# Patient Record
Sex: Female | Born: 1960 | Race: Black or African American | Hispanic: No | Marital: Married | Smoking: Current every day smoker
Health system: Southern US, Community
[De-identification: ages and names within clinical notes are randomized; demographics above are authoritative.]

## PROBLEM LIST (undated history)

## (undated) DIAGNOSIS — G8929 Other chronic pain: Secondary | ICD-10-CM

## (undated) DIAGNOSIS — E785 Hyperlipidemia, unspecified: Secondary | ICD-10-CM

## (undated) DIAGNOSIS — K219 Gastro-esophageal reflux disease without esophagitis: Secondary | ICD-10-CM

## (undated) DIAGNOSIS — R011 Cardiac murmur, unspecified: Secondary | ICD-10-CM

## (undated) DIAGNOSIS — Z8489 Family history of other specified conditions: Secondary | ICD-10-CM

## (undated) DIAGNOSIS — I82409 Acute embolism and thrombosis of unspecified deep veins of unspecified lower extremity: Secondary | ICD-10-CM

## (undated) DIAGNOSIS — M545 Low back pain, unspecified: Secondary | ICD-10-CM

## (undated) DIAGNOSIS — G43909 Migraine, unspecified, not intractable, without status migrainosus: Secondary | ICD-10-CM

## (undated) HISTORY — DX: Gastro-esophageal reflux disease without esophagitis: K21.9

## (undated) HISTORY — DX: Low back pain, unspecified: M54.50

## (undated) HISTORY — DX: Other chronic pain: G89.29

## (undated) HISTORY — DX: Hyperlipidemia, unspecified: E78.5

## (undated) HISTORY — PX: TONSILLECTOMY: SUR1361

## (undated) HISTORY — DX: Migraine, unspecified, not intractable, without status migrainosus: G43.909

## (undated) HISTORY — DX: Low back pain: M54.5

---

## 2003-01-11 ENCOUNTER — Emergency Department (HOSPITAL_COMMUNITY): Admission: EM | Admit: 2003-01-11 | Discharge: 2003-01-11 | Payer: Self-pay | Admitting: Emergency Medicine

## 2004-06-23 ENCOUNTER — Other Ambulatory Visit: Admission: RE | Admit: 2004-06-23 | Discharge: 2004-06-23 | Payer: Self-pay | Admitting: Family Medicine

## 2004-08-29 ENCOUNTER — Emergency Department (HOSPITAL_COMMUNITY): Admission: EM | Admit: 2004-08-29 | Discharge: 2004-08-29 | Payer: Self-pay | Admitting: Emergency Medicine

## 2005-07-09 ENCOUNTER — Other Ambulatory Visit: Admission: RE | Admit: 2005-07-09 | Discharge: 2005-07-09 | Payer: Self-pay | Admitting: Family Medicine

## 2007-07-28 ENCOUNTER — Ambulatory Visit: Payer: Self-pay | Admitting: Internal Medicine

## 2007-07-28 DIAGNOSIS — K219 Gastro-esophageal reflux disease without esophagitis: Secondary | ICD-10-CM

## 2007-07-28 DIAGNOSIS — E1169 Type 2 diabetes mellitus with other specified complication: Secondary | ICD-10-CM | POA: Insufficient documentation

## 2007-07-28 DIAGNOSIS — E785 Hyperlipidemia, unspecified: Secondary | ICD-10-CM

## 2007-07-28 DIAGNOSIS — G43909 Migraine, unspecified, not intractable, without status migrainosus: Secondary | ICD-10-CM | POA: Insufficient documentation

## 2007-07-28 DIAGNOSIS — R030 Elevated blood-pressure reading, without diagnosis of hypertension: Secondary | ICD-10-CM

## 2007-07-28 DIAGNOSIS — R079 Chest pain, unspecified: Secondary | ICD-10-CM

## 2007-07-28 DIAGNOSIS — F17200 Nicotine dependence, unspecified, uncomplicated: Secondary | ICD-10-CM | POA: Insufficient documentation

## 2007-07-29 ENCOUNTER — Encounter: Payer: Self-pay | Admitting: Internal Medicine

## 2007-07-29 ENCOUNTER — Telehealth: Payer: Self-pay | Admitting: Internal Medicine

## 2007-08-03 ENCOUNTER — Telehealth (INDEPENDENT_AMBULATORY_CARE_PROVIDER_SITE_OTHER): Payer: Self-pay | Admitting: *Deleted

## 2007-08-08 ENCOUNTER — Encounter: Payer: Self-pay | Admitting: Internal Medicine

## 2007-08-08 ENCOUNTER — Ambulatory Visit: Payer: Self-pay

## 2007-09-06 ENCOUNTER — Telehealth: Payer: Self-pay | Admitting: *Deleted

## 2007-09-16 ENCOUNTER — Ambulatory Visit: Payer: Self-pay | Admitting: Internal Medicine

## 2007-09-16 LAB — CONVERTED CEMR LAB
Nitrite: POSITIVE
Specific Gravity, Urine: >=1.03
Urobilinogen, UA: 0.2

## 2007-09-19 LAB — CONVERTED CEMR LAB
Alkaline Phosphatase: 50 units/L (ref 39–117)
Bilirubin, Direct: 0.1 mg/dL (ref 0.0–0.3)
Chloride: 101 meq/L (ref 96–112)
GFR calc Af Amer: 86 mL/min
GFR calc non Af Amer: 71 mL/min
Glucose, Bld: 87 mg/dL (ref 70–99)
HCT: 39.7 % (ref 36.0–46.0)
HDL: 44.9 mg/dL (ref >39.0)
LDL Cholesterol: 101 mg/dL — ABNORMAL HIGH (ref 0–99)
Monocytes Absolute: 0.5 10*3/uL (ref 0.1–1.0)
Monocytes Relative: 8.5 % (ref 3.0–12.0)
Neutrophils Relative %: 37.4 % — ABNORMAL LOW (ref 43.0–77.0)
Platelets: 301 10*3/uL (ref 150–400)
Potassium: 3.8 meq/L (ref 3.5–5.1)
RDW: 12.7 % (ref 11.5–14.6)
Sodium: 140 meq/L (ref 135–145)
Total CHOL/HDL Ratio: 3.6
Total Protein: 7.3 g/dL (ref 6.0–8.3)
Triglycerides: 84 mg/dL (ref 0–149)
VLDL: 17 mg/dL (ref 0–40)

## 2007-09-26 ENCOUNTER — Ambulatory Visit: Payer: Self-pay | Admitting: Internal Medicine

## 2007-09-26 DIAGNOSIS — E559 Vitamin D deficiency, unspecified: Secondary | ICD-10-CM | POA: Insufficient documentation

## 2007-10-04 ENCOUNTER — Telehealth: Payer: Self-pay | Admitting: Internal Medicine

## 2007-10-17 ENCOUNTER — Telehealth: Payer: Self-pay | Admitting: Internal Medicine

## 2007-11-10 ENCOUNTER — Telehealth: Payer: Self-pay | Admitting: *Deleted

## 2007-12-26 ENCOUNTER — Ambulatory Visit: Payer: Self-pay | Admitting: Internal Medicine

## 2007-12-26 LAB — CONVERTED CEMR LAB: Vit D, 1,25-Dihydroxy: 46 (ref 30–89)

## 2008-01-02 ENCOUNTER — Ambulatory Visit: Payer: Self-pay | Admitting: Internal Medicine

## 2008-01-02 ENCOUNTER — Encounter: Payer: Self-pay | Admitting: Internal Medicine

## 2008-01-02 ENCOUNTER — Other Ambulatory Visit: Admission: RE | Admit: 2008-01-02 | Discharge: 2008-01-02 | Payer: Self-pay | Admitting: Internal Medicine

## 2008-02-01 ENCOUNTER — Ambulatory Visit: Payer: Self-pay | Admitting: Gastroenterology

## 2008-02-22 LAB — CONVERTED CEMR LAB: Pap Smear: NORMAL

## 2008-04-04 ENCOUNTER — Telehealth: Payer: Self-pay | Admitting: *Deleted

## 2008-05-11 ENCOUNTER — Telehealth: Payer: Self-pay | Admitting: Internal Medicine

## 2008-06-25 ENCOUNTER — Ambulatory Visit: Payer: Self-pay | Admitting: *Deleted

## 2008-06-25 DIAGNOSIS — I10 Essential (primary) hypertension: Secondary | ICD-10-CM | POA: Insufficient documentation

## 2008-06-25 LAB — CONVERTED CEMR LAB
ALT: 14 units/L (ref 0–35)
AST: 18 units/L (ref 0–37)
Albumin: 3.8 g/dL (ref 3.5–5.2)
Calcium: 9.1 mg/dL (ref 8.4–10.5)
Chloride: 106 meq/L (ref 96–112)
Potassium: 3.9 meq/L (ref 3.5–5.1)

## 2008-07-20 ENCOUNTER — Ambulatory Visit: Payer: Self-pay | Admitting: Internal Medicine

## 2008-07-20 DIAGNOSIS — M719 Bursopathy, unspecified: Secondary | ICD-10-CM

## 2008-07-20 DIAGNOSIS — S4980XA Other specified injuries of shoulder and upper arm, unspecified arm, initial encounter: Secondary | ICD-10-CM

## 2008-07-20 DIAGNOSIS — M67919 Unspecified disorder of synovium and tendon, unspecified shoulder: Secondary | ICD-10-CM | POA: Insufficient documentation

## 2008-07-20 DIAGNOSIS — G8929 Other chronic pain: Secondary | ICD-10-CM | POA: Insufficient documentation

## 2008-07-27 ENCOUNTER — Ambulatory Visit (HOSPITAL_BASED_OUTPATIENT_CLINIC_OR_DEPARTMENT_OTHER): Admission: RE | Admit: 2008-07-27 | Discharge: 2008-07-27 | Payer: Self-pay | Admitting: Internal Medicine

## 2008-07-27 ENCOUNTER — Ambulatory Visit: Payer: Self-pay | Admitting: Diagnostic Radiology

## 2008-07-27 ENCOUNTER — Ambulatory Visit: Payer: Self-pay | Admitting: Internal Medicine

## 2008-07-27 ENCOUNTER — Telehealth: Payer: Self-pay | Admitting: Internal Medicine

## 2008-08-08 ENCOUNTER — Encounter: Admission: RE | Admit: 2008-08-08 | Discharge: 2008-09-13 | Payer: Self-pay | Admitting: Orthopaedic Surgery

## 2008-09-20 ENCOUNTER — Ambulatory Visit: Payer: Self-pay | Admitting: Internal Medicine

## 2008-09-20 DIAGNOSIS — S335XXA Sprain of ligaments of lumbar spine, initial encounter: Secondary | ICD-10-CM

## 2008-09-25 ENCOUNTER — Telehealth: Payer: Self-pay | Admitting: Internal Medicine

## 2008-09-25 ENCOUNTER — Encounter: Payer: Self-pay | Admitting: Internal Medicine

## 2008-09-27 ENCOUNTER — Ambulatory Visit: Payer: Self-pay | Admitting: Internal Medicine

## 2008-10-04 ENCOUNTER — Encounter: Admission: RE | Admit: 2008-10-04 | Discharge: 2008-11-19 | Payer: Self-pay | Admitting: Orthopedic Surgery

## 2008-11-05 ENCOUNTER — Emergency Department (HOSPITAL_BASED_OUTPATIENT_CLINIC_OR_DEPARTMENT_OTHER): Admission: EM | Admit: 2008-11-05 | Discharge: 2008-11-05 | Payer: Self-pay | Admitting: Emergency Medicine

## 2008-11-05 ENCOUNTER — Ambulatory Visit: Payer: Self-pay | Admitting: Radiology

## 2008-12-24 ENCOUNTER — Emergency Department (HOSPITAL_BASED_OUTPATIENT_CLINIC_OR_DEPARTMENT_OTHER): Admission: EM | Admit: 2008-12-24 | Discharge: 2008-12-25 | Payer: Self-pay | Admitting: Emergency Medicine

## 2008-12-27 ENCOUNTER — Telehealth: Payer: Self-pay | Admitting: Internal Medicine

## 2009-05-07 ENCOUNTER — Telehealth: Payer: Self-pay | Admitting: Internal Medicine

## 2009-05-24 ENCOUNTER — Ambulatory Visit: Payer: Self-pay | Admitting: Family Medicine

## 2009-05-24 DIAGNOSIS — H8110 Benign paroxysmal vertigo, unspecified ear: Secondary | ICD-10-CM

## 2009-05-24 DIAGNOSIS — Z87448 Personal history of other diseases of urinary system: Secondary | ICD-10-CM

## 2009-05-24 LAB — CONVERTED CEMR LAB
Protein, U semiquant: NEGATIVE
Urobilinogen, UA: 0.2
WBC Urine, dipstick: NEGATIVE

## 2009-05-25 ENCOUNTER — Encounter: Payer: Self-pay | Admitting: Family Medicine

## 2009-05-27 LAB — CONVERTED CEMR LAB
ALT: 17 units/L (ref 0–35)
AST: 16 units/L (ref 0–37)
Alkaline Phosphatase: 59 units/L (ref 39–117)
BUN: 16 mg/dL (ref 6–23)
Bilirubin, Direct: 0.1 mg/dL (ref 0.0–0.3)
Cholesterol: 172 mg/dL (ref 0–200)
Eosinophils Relative: 1.3 % (ref 0.0–5.0)
GFR calc non Af Amer: 85.53 mL/min (ref >60)
HCT: 40.3 % (ref 36.0–46.0)
Hgb A1c MFr Bld: 6.4 % (ref 4.6–6.5)
LDL Cholesterol: 105 mg/dL — ABNORMAL HIGH (ref 0–99)
Lymphs Abs: 3.6 10*3/uL (ref 0.7–4.0)
Monocytes Relative: 6.2 % (ref 3.0–12.0)
Platelets: 265 10*3/uL (ref 150.0–400.0)
Potassium: 4.5 meq/L (ref 3.5–5.1)
Sodium: 138 meq/L (ref 135–145)
Total Bilirubin: 0.4 mg/dL (ref 0.3–1.2)
VLDL: 9.2 mg/dL (ref 0.0–40.0)
Vit D, 25-Hydroxy: 38 ng/mL (ref 30–89)
WBC: 6.6 10*3/uL (ref 4.5–10.5)

## 2009-06-10 ENCOUNTER — Ambulatory Visit: Payer: Self-pay | Admitting: Family Medicine

## 2009-06-10 LAB — CONVERTED CEMR LAB
Blood in Urine, dipstick: NEGATIVE
Nitrite: NEGATIVE
Urobilinogen, UA: 0.2

## 2009-06-11 ENCOUNTER — Encounter: Payer: Self-pay | Admitting: Family Medicine

## 2009-06-13 LAB — CONVERTED CEMR LAB
Casts: NONE SEEN /lpf
Crystals: NONE SEEN
RBC / HPF: 2 (ref <3)

## 2009-07-19 ENCOUNTER — Telehealth (INDEPENDENT_AMBULATORY_CARE_PROVIDER_SITE_OTHER): Payer: Self-pay | Admitting: *Deleted

## 2009-07-22 ENCOUNTER — Ambulatory Visit: Payer: Self-pay | Admitting: Family Medicine

## 2009-07-22 DIAGNOSIS — M546 Pain in thoracic spine: Secondary | ICD-10-CM

## 2009-08-05 ENCOUNTER — Ambulatory Visit: Payer: Self-pay | Admitting: Family Medicine

## 2009-08-05 DIAGNOSIS — B351 Tinea unguium: Secondary | ICD-10-CM

## 2009-08-08 ENCOUNTER — Encounter: Payer: Self-pay | Admitting: Family Medicine

## 2009-08-26 ENCOUNTER — Encounter (INDEPENDENT_AMBULATORY_CARE_PROVIDER_SITE_OTHER): Payer: Self-pay | Admitting: *Deleted

## 2009-08-26 ENCOUNTER — Other Ambulatory Visit
Admission: RE | Admit: 2009-08-26 | Discharge: 2009-08-26 | Payer: Self-pay | Source: Home / Self Care | Admitting: Family Medicine

## 2009-08-26 ENCOUNTER — Ambulatory Visit: Payer: Self-pay | Admitting: Family Medicine

## 2009-08-28 ENCOUNTER — Telehealth (INDEPENDENT_AMBULATORY_CARE_PROVIDER_SITE_OTHER): Payer: Self-pay | Admitting: *Deleted

## 2009-08-30 ENCOUNTER — Encounter (INDEPENDENT_AMBULATORY_CARE_PROVIDER_SITE_OTHER): Payer: Self-pay | Admitting: *Deleted

## 2009-09-03 ENCOUNTER — Ambulatory Visit: Payer: Self-pay | Admitting: Family Medicine

## 2009-09-09 ENCOUNTER — Encounter: Payer: Self-pay | Admitting: Family Medicine

## 2009-11-07 ENCOUNTER — Telehealth: Payer: Self-pay | Admitting: Family Medicine

## 2009-11-12 ENCOUNTER — Ambulatory Visit: Payer: Self-pay | Admitting: Family Medicine

## 2009-11-12 DIAGNOSIS — F418 Other specified anxiety disorders: Secondary | ICD-10-CM

## 2009-11-20 ENCOUNTER — Telehealth (INDEPENDENT_AMBULATORY_CARE_PROVIDER_SITE_OTHER): Payer: Self-pay | Admitting: *Deleted

## 2009-11-25 ENCOUNTER — Ambulatory Visit: Payer: Self-pay | Admitting: Family Medicine

## 2009-11-28 LAB — CONVERTED CEMR LAB
ALT: 12 units/L (ref 0–35)
BUN: 8 mg/dL (ref 6–23)
CO2: 27 meq/L (ref 19–32)
Chloride: 103 meq/L (ref 96–112)
Cholesterol: 160 mg/dL (ref 0–200)
Creatinine, Ser: 0.9 mg/dL (ref 0.4–1.2)
Total Protein: 6.9 g/dL (ref 6.0–8.3)
Triglycerides: 89 mg/dL (ref 0.0–149.0)

## 2010-01-23 ENCOUNTER — Encounter: Payer: Self-pay | Admitting: Family Medicine

## 2010-04-23 ENCOUNTER — Ambulatory Visit
Admission: RE | Admit: 2010-04-23 | Discharge: 2010-04-23 | Payer: Self-pay | Source: Home / Self Care | Attending: Family Medicine | Admitting: Family Medicine

## 2010-04-23 ENCOUNTER — Encounter: Payer: Self-pay | Admitting: Family Medicine

## 2010-04-23 DIAGNOSIS — R300 Dysuria: Secondary | ICD-10-CM | POA: Insufficient documentation

## 2010-04-23 DIAGNOSIS — IMO0002 Reserved for concepts with insufficient information to code with codable children: Secondary | ICD-10-CM | POA: Insufficient documentation

## 2010-04-23 LAB — CONVERTED CEMR LAB
Glucose, Urine, Semiquant: NEGATIVE
Specific Gravity, Urine: 1.015

## 2010-04-24 ENCOUNTER — Encounter: Payer: Self-pay | Admitting: Family Medicine

## 2010-04-28 ENCOUNTER — Ambulatory Visit (HOSPITAL_BASED_OUTPATIENT_CLINIC_OR_DEPARTMENT_OTHER)
Admission: RE | Admit: 2010-04-28 | Discharge: 2010-04-28 | Payer: Self-pay | Source: Home / Self Care | Attending: Family Medicine | Admitting: Family Medicine

## 2010-04-28 ENCOUNTER — Encounter (INDEPENDENT_AMBULATORY_CARE_PROVIDER_SITE_OTHER): Payer: Self-pay | Admitting: *Deleted

## 2010-04-28 ENCOUNTER — Telehealth: Payer: Self-pay | Admitting: Family Medicine

## 2010-04-29 ENCOUNTER — Telehealth: Payer: Self-pay | Admitting: Family Medicine

## 2010-04-30 ENCOUNTER — Encounter
Admission: RE | Admit: 2010-04-30 | Discharge: 2010-04-30 | Payer: Self-pay | Source: Home / Self Care | Attending: Family Medicine | Admitting: Family Medicine

## 2010-05-01 ENCOUNTER — Encounter: Payer: Self-pay | Admitting: Family Medicine

## 2010-05-01 ENCOUNTER — Telehealth: Payer: Self-pay | Admitting: Family Medicine

## 2010-05-02 ENCOUNTER — Encounter: Payer: Self-pay | Admitting: Family Medicine

## 2010-05-04 LAB — CONVERTED CEMR LAB
ALT: 16 units/L (ref 0–35)
BUN: 16 mg/dL (ref 6–23)
Bilirubin, Direct: 0.1 mg/dL (ref 0.0–0.3)
Calcium: 9.3 mg/dL (ref 8.4–10.5)
Chloride: 103 meq/L (ref 96–112)
Creatinine, Ser: 0.8 mg/dL (ref 0.4–1.2)
Eosinophils Absolute: 0.1 10*3/uL (ref 0.0–0.7)
Eosinophils Relative: 1.9 % (ref 0.0–5.0)
GFR calc non Af Amer: 103.85 mL/min (ref >60)
HDL: 55.9 mg/dL (ref >39.00)
LDL Cholesterol: 123 mg/dL — ABNORMAL HIGH (ref 0–99)
Lymphocytes Relative: 58.9 % — ABNORMAL HIGH (ref 12.0–46.0)
MCV: 95.2 fL (ref 78.0–100.0)
Monocytes Absolute: 0.4 10*3/uL (ref 0.1–1.0)
Neutrophils Relative %: 32.9 % — ABNORMAL LOW (ref 43.0–77.0)
Pap Smear: NEGATIVE
Platelets: 297 10*3/uL (ref 150.0–400.0)
Total Bilirubin: 0.5 mg/dL (ref 0.3–1.2)
Triglycerides: 87 mg/dL (ref 0.0–149.0)
VLDL: 17.4 mg/dL (ref 0.0–40.0)
Vit D, 25-Hydroxy: 45 ng/mL (ref 30–89)
WBC: 7.6 10*3/uL (ref 4.5–10.5)

## 2010-05-06 NOTE — Letter (Signed)
Summary: Duty Status Report/USPS  Duty Status Report/USPS   Imported By: Lanelle Bal 08/09/2009 10:52:45  _____________________________________________________________________  External Attachment:    Type:   Image     Comment:   External Document

## 2010-05-06 NOTE — Assessment & Plan Note (Signed)
Summary: depressed/cbs   Vital Signs:  Patient profile:   50 year old female Height:      64 inches Weight:      210 pounds Temp:     98.5 degrees F oral Pulse rate:   80 / minute BP sitting:   120 / 58  (left arm)  Vitals Entered By: Jeremy Johann CMA (November 12, 2009 1:50 PM) CC: discuss hormonal changes, Depressive symptoms   History of Present Illness:  Depressive symptoms      This is a 50 year old woman who presents with Depressive symptoms.  The symptoms began 1 week ago.  The patient reports depressed mood and loss of interest/pleasure, but denies significant weight loss, significant weight gain, insomnia, hypersomnia, psychomotor agitation, and psychomotor retardation.  The patient also reports fatigue or loss of energy, feelings of worthlessness, and diminished concentration.  The patient denies indecisiveness, thoughts of death, thoughts of suicide, suicidal intent, and suicidal plans.  Patient's past history includes depression.  The patient denies abnormally elevated mood, abnormally irritable mood, decreased need for sleep, increased talkativeness, distractibility, flight of ideas, increased goal-directed activity, and inflated self-esteem/ grandiosity.    Preventive Screening-Counseling & Management  Alcohol-Tobacco     Alcohol drinks/day: none     Smoking Status: current     Smoking Cessation Counseling: yes     Smoke Cessation Stage: not ready     Packs/Day: 1     Year Started: 1975     Cigars/week: 1 pack a day     Passive Smoke Exposure: no  Caffeine-Diet-Exercise     Caffeine use/day: none     Does Patient Exercise: no     Exercise Counseling: to improve exercise regimen  Current Medications (verified): 1)  Zocor 40 Mg Tabs (Simvastatin) .Marland Kitchen.. 1 By Mouth At Bedtime. 2)  Fish Oil Concentrate 1000 Mg  Caps (Omega-3 Fatty Acids) 3)  Benicar Hct 20-12.5 Mg  Tabs (Olmesartan Medoxomil-Hctz) .Marland Kitchen.. 1 By Mouth Once Daily 4)  Prilosec Otc 20 Mg Tbec (Omeprazole  Magnesium) 5)  D 1000 Plus  Tabs (Fa-Cyanocobalamin-B6-D-Ca) .... Take 1 Tablet By Mouth Once A Day 6)  Antivert 25 Mg Tabs (Meclizine Hcl) 7)  Ultram 50 Mg Tabs (Tramadol Hcl) .... 1/2 - 1 By Mouth Every 6 Hours As Needed 8)  Ginger Pills 9)  Flector 1.3 % Ptch (Diclofenac Epolamine) .Marland Kitchen.. 1 Patch Q Day 10)  Flexeril 10 Mg Tabs (Cyclobenzaprine Hcl) .Marland Kitchen.. 1 By Mouth Three Times A Day 11)  Back Brace Specifically For Lumbar Support .... Dx  Back Pain1 12)  Vitamin A 13)  Grapeseed Complex 14)  Prozac 10 Mg Caps (Fluoxetine Hcl) .Marland Kitchen.. 1 By Mouth Once Daily  Allergies (verified): No Known Drug Allergies  Past History:  Past medical, surgical, family and social histories (including risk factors) reviewed for relevance to current acute and chronic problems.  Past Medical History: Reviewed history from 09/27/2008 and no changes required. GERD Hyperlipidemia    vitamin D deficiency  TOBACCO USE (ICD-305.1)  MIGRAINE (ICD-346.90) Hypertension Low back pain  Past Surgical History: Reviewed history from 08/26/2009 and no changes required.  Tonsillectomy     Family History: Reviewed history from 08/26/2009 and no changes required. father  died 16 of mi mom cabg  pacer   60s   sis dm  died  sis  died   50yo-- "heart stopped" Family History Diabetes 1st degree relative ? gm breast cancer  MOM arthritis crippling  and gm  Family History  of Arthritis Family History of CAD Female 1st degree relative <60   Family History of CAD Female 1st degree relative <50         Social History: Reviewed history from 09/27/2008 and no changes required. Divorced  Current Smoker 1ppd     Alcohol use-no Drug use-no    Regular exercise-no coffee every day  ..   2 perday hh of 5   children and mom      works 7pm-3am    at post office   sleep irregular.    Review of Systems      See HPI Psych:  Complains of depression, easily tearful, and irritability; denies alternate hallucination (  auditory/visual), anxiety, easily angered, mental problems, panic attacks, sense of great danger, suicidal thoughts/plans, thoughts of violence, unusual visions or sounds, and thoughts /plans of harming others.  Physical Exam  General:  Well-developed,well-nourished,in no acute distress; alert,appropriate and cooperative throughout examination Neck:  No deformities, masses, or tenderness noted. Lungs:  Normal respiratory effort, chest expands symmetrically. Lungs are clear to auscultation, no crackles or wheezes. Heart:  Normal rate and regular rhythm. S1 and S2 normal without gallop, murmur, click, rub or other extra sounds. Extremities:  No clubbing, cyanosis, edema, or deformity noted with normal full range of motion of all joints.   Psych:  Oriented X3, memory intact for recent and remote, normally interactive, good eye contact, not suicidal, subdued, and tearful.     Impression & Recommendations:  Problem # 1:  DEPRESSION, MILD (ICD-311)  Her updated medication list for this problem includes:    Prozac 10 Mg Caps (Fluoxetine hcl) .Marland Kitchen... 1 by mouth once daily check labs rto 2 weeks or sooner as needed   Complete Medication List: 1)  Zocor 40 Mg Tabs (Simvastatin) .Marland Kitchen.. 1 by mouth at bedtime. 2)  Fish Oil Concentrate 1000 Mg Caps (Omega-3 fatty acids) 3)  Benicar Hct 20-12.5 Mg Tabs (Olmesartan medoxomil-hctz) .Marland Kitchen.. 1 by mouth once daily 4)  Prilosec Otc 20 Mg Tbec (Omeprazole magnesium) 5)  D 1000 Plus Tabs (Fa-cyanocobalamin-b6-d-ca) .... Take 1 tablet by mouth once a day 6)  Antivert 25 Mg Tabs (Meclizine hcl) 7)  Ultram 50 Mg Tabs (Tramadol hcl) .... 1/2 - 1 by mouth every 6 hours as needed 8)  Ginger Pills  9)  Flector 1.3 % Ptch (Diclofenac epolamine) .Marland Kitchen.. 1 patch q day 10)  Flexeril 10 Mg Tabs (Cyclobenzaprine hcl) .Marland Kitchen.. 1 by mouth three times a day 11)  Back Brace Specifically For Lumbar Support  .... Dx  back pain1 12)  Vitamin A  13)  Grapeseed Complex  14)  Prozac 10 Mg  Caps (Fluoxetine hcl) .Marland Kitchen.. 1 by mouth once daily  Patient Instructions: 1)  Please schedule a follow-up appointment in 2 weeks.  Prescriptions: PROZAC 10 MG CAPS (FLUOXETINE HCL) 1 by mouth once daily  #30 x 2   Entered and Authorized by:   Loreen Freud DO   Signed by:   Loreen Freud DO on 11/12/2009   Method used:   Faxed to ...       [id number] (retail)       [address]       [place], [place]  [address]       Ph: [phone]       Fax: [phone]   RxID:   [id number]

## 2010-05-06 NOTE — Letter (Signed)
Summary: Results Follow up Letter  [facility] [place]  [address] [place], [place] [address]   Phone: [phone]  Fax: [phone]    08/30/2009 MRN: [id number]  [facility] [address] [place], [place]  [address]  Dear Ms. Talmadge Coventry,  The following are the results of your recent test(s):  Test         Result    Pap Smear:        Normal __X___  Not Normal _____ Comments: ______________________________________________________ Cholesterol: LDL(Bad cholesterol):         Your goal is less than:         HDL (Good cholesterol):       Your goal is more than: Comments:  ______________________________________________________ Mammogram:        Normal _____  Not Normal _____ Comments:  ___________________________________________________________________ Hemoccult:        Normal _____  Not normal _______ Comments:    _____________________________________________________________________ Other Tests:    We routinely do not discuss normal results over the telephone.  If you desire a copy of the results, or you have any questions about this information we can discuss them at your next office visit.   Sincerely,    Army Fossa CMA  Aug 30, 2009 8:00 AM

## 2010-05-06 NOTE — Progress Notes (Signed)
Summary: FMLA PAPERWORK TO COMPLETE  Phone Note Call from Patient Call back at Home Phone [phone]   Caller: Patient Summary of Call: PATIENT DROPPED OFF FMLA FORMS---NEEDS TO HAVE IT FOR ***** DATES 11/05/2009 THRU 11/07/2009*****  AND***** ALSO FOR FUTURE PERIODIC FLARE-UPS WHEN THEY OCCUR  WILL ATTACH BILLING SHEET TO FMLA FORM   WILL TAKE TO [employer] IN [employer]----WHEN COMPLETED, BRING FORM AND BILLING SHEET  TO [facility]  Initial call taken by: Jerolyn Shin,  November 20, 2009 4:11 PM  Follow-up for Phone Call        placed on ledge...............Marland KitchenFelecia Deloach CMA  November 21, 2009 9:57 AM   Additional Follow-up for Phone Call Additional follow up Details #1::        has this been done?   Additional Follow-up by: Loreen Freud DO,  November 22, 2009 3:56 PM    Additional Follow-up for Phone Call Additional follow up Details #2::    fmla forms not already done placed on ledge for completion..............Marland KitchenFelecia Deloach CMA  November 25, 2009 8:33 AM  left pt detail message to return call in regard to see what condition fmla paperwork are in regards to.............Marland KitchenFelecia Deloach CMA  November 25, 2009 9:27 AM   Additional Follow-up for Phone Call Additional follow up Details #3:: Details for Additional Follow-up Action Taken: done and given to pt at [facility] Additional Follow-up by: Loreen Freud DO,  November 25, 2009 1:29 PM

## 2010-05-06 NOTE — Consult Note (Signed)
Summary: [facility]   Imported By: Lanelle Bal 08/23/2009 09:16:26  _____________________________________________________________________  External Attachment:    Type:   Image     Comment:   External Document

## 2010-05-06 NOTE — Assessment & Plan Note (Signed)
Summary: NEW TO ESTABLISH//[facility]   Vital Signs:  Patient profile:   50 year old female Weight:      210 pounds Pulse rate:   82 / minute Pulse rhythm:   regular BP sitting:   110 / 74  (left arm) Cuff size:   regular  Vitals Entered By: Army Fossa CMA (May 24, 2009 9:47 AM) CC: New to establish- lower back pain, side pain x 1 week. No problems urinating. , Back Pain   History of Present Illness:       This is a 50 year old woman who presents with Back Pain.  The symptoms began 1 week ago.  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  --   Worse with sitting.  The patient denies fever, chills, weakness, loss of sensation, fecal incontinence, urinary incontinence, urinary retention, dysuria, rest pain, inability to work, and inability to care for self.  The pain is located in the left low back.  The pain began at work and gradually.  The pain is made better by inactivity and NSAID medications.  Pt had a back injury at work about 1 year ago at work.   Pt also dx with Vertigo at ER.  pt requesting fmla papers be filled out but we have not record of problem.  Pt will need w/u.  Pt can not operate machinery while have an attack.        Preventive Screening-Counseling & Management  Alcohol-Tobacco     Alcohol drinks/day: none     Smoking Status: current     Packs/Day: 1     Year Started: 1975     Cigars/week: 1 pack a day     Passive Smoke Exposure: no  Caffeine-Diet-Exercise     Caffeine use/day: none     Does Patient Exercise: no  Current Medications (verified): 1)  Simvastatin 20 Mg  Tabs (Simvastatin) .Marland Kitchen.. 1 By Mouth Once Daily 2)  Fish Oil Concentrate 1000 Mg  Caps (Omega-3 Fatty Acids) 3)  Benicar Hct 20-12.5 Mg  Tabs (Olmesartan Medoxomil-Hctz) .Marland Kitchen.. 1 By Mouth Once Daily 4)  Prilosec Otc 20 Mg Tbec (Omeprazole Magnesium) 5)  D 1000 Plus  Tabs (Fa-Cyanocobalamin-B6-D-Ca) .... Take 1 Tablet By  Mouth Once A Day 6)  Antivert 25 Mg Tabs (Meclizine Hcl) 7)  Ultram 50 Mg Tabs (Tramadol Hcl) .... 1/2 - 1 By Mouth Every 6 Hours As Needed  Allergies (verified): No Known Drug Allergies  Past History:  Family History: Last updated: 2008/10/11 father  died 54 of mi mom cabg  pacer   60s   sis dm  died  sis  died in or for gyne      28 Family History Diabetes 1st degree relative ? gm breast cancer  MOM arthritis crippling  and gm  Family History of Arthritis Family History of CAD Female 1st degree relative <60   Family History of CAD Female 1st degree relative <50         Social History: Last updated: 10/11/08 Divorced  Current Smoker 1ppd     Alcohol use-no Drug use-no    Regular exercise-no coffee every day  ..   2 perday hh of 5   children and mom      works 7pm-3am    at post office   sleep irregular.    Risk Factors: Alcohol Use: none (05/24/2009) Caffeine Use: none (05/24/2009)  Exercise: no (05/24/2009)  Risk Factors: Smoking Status: current (05/24/2009) Packs/Day: 1 (05/24/2009) Cigars/wk: 1 pack a day (05/24/2009) Passive Smoke Exposure: no (05/24/2009)  Past medical, surgical, family and social histories (including risk factors) reviewed for relevance to current acute and chronic problems.  Past Medical History: Reviewed history from 09/27/2008 and no changes required. GERD Hyperlipidemia    vitamin D deficiency  TOBACCO USE (ICD-305.1)  MIGRAINE (ICD-346.90) Hypertension Low back pain  Past Surgical History: Reviewed history from 09/27/2008 and no changes required. Denies surgical history    Tonsillectomy     Family History: Reviewed history from 09/27/2008 and no changes required. father  died 26 of mi mom cabg  pacer   60s   sis dm  died  sis  died in or for gyne      35 Family History Diabetes 1st degree relative ? gm breast cancer  MOM arthritis crippling  and gm  Family History of Arthritis Family History of CAD Female 1st degree  relative <60   Family History of CAD Female 1st degree relative <50         Social History: Reviewed history from 09/27/2008 and no changes required. Divorced  Current Smoker 1ppd     Alcohol use-no Drug use-no    Regular exercise-no coffee every day  ..   2 perday hh of 5   children and mom      works 7pm-3am    at post office   sleep irregular.    Review of Systems      See HPI  Physical Exam  General:  Well-developed,well-nourished,in no acute distress; alert,appropriate and cooperative throughout examination Abdomen:  Bowel sounds positive,abdomen soft and non-tender without masses, organomegaly or hernias noted. Msk:  normal ROM and no joint tenderness.   Extremities:  No clubbing, cyanosis, edema, or deformity noted with normal full range of motion of all joints.   Neurologic:  alert & oriented X3, strength normal in all extremities, gait normal, and DTRs symmetrical and normal.   Skin:  Intact without suspicious lesions or rashes Psych:  Oriented X3, normally interactive, good eye contact, not anxious appearing, and not depressed appearing.     Impression & Recommendations:  Problem # 1:  LUMBAR STRAIN, ACUTE (ICD-847.2) ultram 50 mg 1 by mouth every 6 hours as needed  warm compresses rto if no better  Problem # 2:  HEMATURIA, MICROSCOPIC, HX OF (ICD-V13.09)  Orders: T-Culture, Urine (33295-18841)  Problem # 3:  BENIGN POSITIONAL VERTIGO (ICD-386.11)  ER records reviewed Her updated medication list for this problem includes:    Antivert 25 Mg Tabs (Meclizine hcl)  Orders: Physical Therapy Referral (PT)  Complete Medication List: 1)  Simvastatin 20 Mg Tabs (Simvastatin) .Marland Kitchen.. 1 by mouth once daily 2)  Fish Oil Concentrate 1000 Mg Caps (Omega-3 fatty acids) 3)  Benicar Hct 20-12.5 Mg Tabs (Olmesartan medoxomil-hctz) .Marland Kitchen.. 1 by mouth once daily 4)  Prilosec Otc 20 Mg Tbec (Omeprazole magnesium) 5)  D 1000 Plus Tabs (Fa-cyanocobalamin-b6-d-ca) .... Take 1 tablet  by mouth once a day 6)  Antivert 25 Mg Tabs (Meclizine hcl) 7)  Ultram 50 Mg Tabs (Tramadol hcl) .... 1/2 - 1 by mouth every 6 hours as needed  Other Orders: Venipuncture (66063) TLB-Lipid Panel (80061-LIPID) TLB-BMP (Basic Metabolic Panel-BMET) (80048-METABOL) TLB-CBC Platelet - w/Differential (85025-CBCD) TLB-Hepatic/Liver Function Pnl (80076-HEPATIC) TLB-A1C / Hgb A1C (Glycohemoglobin) (83036-A1C) T-Vitamin D (25-Hydroxy) (01601-09323) Prescriptions: ULTRAM 50 MG TABS (TRAMADOL HCL) 1/2 - 1 by mouth every 6 hours as needed  #  30 x 0   Entered and Authorized by:   Loreen Freud DO   Signed by:   Loreen Freud DO on 05/24/2009   Method used:   Electronically to        [facility] [id number] (retail)       [address] [place], [place]  [address]       Ph: [phone] or [phone]       Fax: [phone]   RxID:   [id number]   Laboratory Results   Urine Tests    Routine Urinalysis   Color: yellow Appearance: Clear Glucose: negative   (Normal Range: Negative) Bilirubin: negative   (Normal Range: Negative) Ketone: negative   (Normal Range: Negative) Spec. Gravity: 1.025   (Normal Range: 1.003-1.035) Blood: trace-lysed   (Normal Range: Negative) pH: 5.0   (Normal Range: 5.0-8.0) Protein: negative   (Normal Range: Negative) Urobilinogen: 0.2   (Normal Range: 0-1) Nitrite: negative   (Normal Range: Negative) Leukocyte Esterace: negative   (Normal Range: Negative)    Comments: Army Fossa CMA  May 24, 2009 9:55 AM

## 2010-05-06 NOTE — Letter (Signed)
Summary: [facility]: Immunoassay Fecal Occult Blood (iFOB) Order Form  [facility] [place]  [address] [place], [place] [address]   Phone: [phone]  Fax: [phone]      [facility]: Immunoassay Fecal Occult Blood (iFOB) Order Form   Aug 26, 2009 MRN: [id number]   Ebony Curtis 05-05-1960   Physicican Name:___yvonne Laury Axon, Do______________________  Diagnosis Code:_______v76.51___________________      Army Fossa CMA

## 2010-05-06 NOTE — Assessment & Plan Note (Signed)
Summary: 2 WEEKS OV//PH   Vital Signs:  Patient profile:   50 year old female Weight:      211 pounds Pulse rate:   87 / minute Pulse rhythm:   regular BP sitting:   122 / 80  (left arm) Cuff size:   regular  Vitals Entered By: Army Fossa CMA (Aug 05, 2009 10:39 AM) CC: Pt here for 2 week OV- follow up on her back.    History of Present Illness: Pt here f/u back..  She is doing much better but has some pressure in low back when she sits or stands for long periods.   Current Medications (verified): 1)  Simvastatin 20 Mg  Tabs (Simvastatin) .Marland Kitchen.. 1 By Mouth Once Daily 2)  Fish Oil Concentrate 1000 Mg  Caps (Omega-3 Fatty Acids) 3)  Benicar Hct 20-12.5 Mg  Tabs (Olmesartan Medoxomil-Hctz) .Marland Kitchen.. 1 By Mouth Once Daily 4)  Prilosec Otc 20 Mg Tbec (Omeprazole Magnesium) 5)  D 1000 Plus  Tabs (Fa-Cyanocobalamin-B6-D-Ca) .... Take 1 Tablet By Mouth Once A Day 6)  Antivert 25 Mg Tabs (Meclizine Hcl) 7)  Ultram 50 Mg Tabs (Tramadol Hcl) .... 1/2 - 1 By Mouth Every 6 Hours As Needed 8)  Ginger Pills 9)  Flector 1.3 % Ptch (Diclofenac Epolamine) .Marland Kitchen.. 1 Patch Q Day 10)  Flexeril 10 Mg Tabs (Cyclobenzaprine Hcl) .Marland Kitchen.. 1 By Mouth Three Times A Day 11)  Back Brace Specifically For Lumbar Support .... Dx  Back Pain1  Allergies (verified): No Known Drug Allergies  Past History:  Past medical, surgical, family and social histories (including risk factors) reviewed for relevance to current acute and chronic problems.  Past Medical History: Reviewed history from 09/27/2008 and no changes required. GERD Hyperlipidemia    vitamin D deficiency  TOBACCO USE (ICD-305.1)  MIGRAINE (ICD-346.90) Hypertension Low back pain  Past Surgical History: Reviewed history from 09/27/2008 and no changes required. Denies surgical history    Tonsillectomy     Family History: Reviewed history from 09/27/2008 and no changes required. father  died 60 of mi mom cabg  pacer   60s   sis dm  died  sis   died in or for gyne      25 Family History Diabetes 1st degree relative ? gm breast cancer  MOM arthritis crippling  and gm  Family History of Arthritis Family History of CAD Female 1st degree relative <60   Family History of CAD Female 1st degree relative <50         Social History: Reviewed history from 09/27/2008 and no changes required. Divorced  Current Smoker 1ppd     Alcohol use-no Drug use-no    Regular exercise-no coffee every day  ..   2 perday hh of 5   children and mom      works 7pm-3am    at post office   sleep irregular.    Review of Systems      See HPI  Physical Exam  General:  Well-developed,well-nourished,in no acute distress; alert,appropriate and cooperative throughout examinationoverweight-appearing.   Msk:  No deformity or scoliosis noted of thoracic or lumbar spine.   Extremities:  b/l feet ---big toenails thick Neurologic:  alert & oriented X3, strength normal in all extremities, gait normal, and DTRs symmetrical and normal.   Psych:  Oriented X3 and normally interactive.     Impression & Recommendations:  Problem # 1:  LUMBAR STRAIN, ACUTE (ICD-847.2) Assessment Improved con't lumbar support  Problem # 2:  BACK  PAIN, THORACIC REGION (ICD-724.1) Assessment: Improved  Her updated medication list for this problem includes:    Ultram 50 Mg Tabs (Tramadol hcl) .Marland Kitchen... 1/2 - 1 by mouth every 6 hours as needed    Flexeril 10 Mg Tabs (Cyclobenzaprine hcl) .Marland Kitchen... 1 by mouth three times a day  Discussed use of moist heat or ice, modified activities, medications, and stretching/strengthening exercises. Back care instructions given. To be seen in 2 weeks if no improvement; sooner if worsening of symptoms.   Complete Medication List: 1)  Simvastatin 20 Mg Tabs (Simvastatin) .Marland Kitchen.. 1 by mouth once daily 2)  Fish Oil Concentrate 1000 Mg Caps (Omega-3 fatty acids) 3)  Benicar Hct 20-12.5 Mg Tabs (Olmesartan medoxomil-hctz) .Marland Kitchen.. 1 by mouth once daily 4)  Prilosec  Otc 20 Mg Tbec (Omeprazole magnesium) 5)  D 1000 Plus Tabs (Fa-cyanocobalamin-b6-d-ca) .... Take 1 tablet by mouth once a day 6)  Antivert 25 Mg Tabs (Meclizine hcl) 7)  Ultram 50 Mg Tabs (Tramadol hcl) .... 1/2 - 1 by mouth every 6 hours as needed 8)  Ginger Pills  9)  Flector 1.3 % Ptch (Diclofenac epolamine) .Marland Kitchen.. 1 patch q day 10)  Flexeril 10 Mg Tabs (Cyclobenzaprine hcl) .Marland Kitchen.. 1 by mouth three times a day 11)  Back Brace Specifically For Lumbar Support  .... Dx  back pain1  Other Orders: Podiatry Referral (Podiatry)  Patient Instructions: 1)  cont to wear lumbar support 2)  ok to return to work--full duty Prescriptions: BACK BRACE SPECIFICALLY FOR LUMBAR SUPPORT dx  back pain1  #1 x 0   Entered and Authorized by:   Loreen Freud DO   Signed by:   Loreen Freud DO on 08/05/2009   Method used:   Print then Give to Patient   RxID:   [id number]

## 2010-05-06 NOTE — Progress Notes (Signed)
Summary: Lab Results  Phone Note Outgoing Call   Call placed by: Army Fossa CMA,  Aug 28, 2009 9:24 AM Reason for Call: Discuss lab or test results Summary of Call: regarding lab results, LMTCB:  LDL cholesterol goal < 100--- increase zocor to 40 mg #30  1 by mouth once daily , 2 refills----recheck 3 months----- [facility], hep 272.4 Signed by Loreen Freud DO on 08/27/2009 at 4:37 PM  Follow-up for Phone Call        Pt is aware, meds called in. Army Fossa CMA  Aug 29, 2009 8:13 AM     New/Updated Medications: ZOCOR 40 MG TABS (SIMVASTATIN) 1 by mouth at bedtime. Prescriptions: ZOCOR 40 MG TABS (SIMVASTATIN) 1 by mouth at bedtime.  #30 x 2   Entered by:   Army Fossa CMA   Authorized by:   Loreen Freud DO   Signed by:   Army Fossa CMA on 08/29/2009   Method used:   Electronically to        [facility] [address] (retail)       [address]       [place], [place]  [address]       Ph: [phone] or [phone]       Fax: [phone]   RxID:   [id number]

## 2010-05-06 NOTE — Letter (Signed)
Summary: Work Dietitian at [employer]  [address] [place], [place] [address]   Phone: [phone]  Fax: [phone]    Today's Date: November 12, 2009  Name of Patient: Ebony Curtis  The above named patient had a medical visit today at:   215p.  Please take this into consideration when reviewing the time away from work.Marland Kitchen    Special Instructions:  [  ] None  [  ] To be off the remainder of today, returning to the normal work / school schedule tomorrow.  [  ] To be off until the next scheduled appointment on ______________________.  [ X ] Other The above patient can go back to work 9/11/2011________________________________________________________________ ________________________________________________________________________   Sincerely yours,   Loreen Freud DO

## 2010-05-06 NOTE — Medication Information (Signed)
Summary: Nonadherence with Benicar/[employer]  Nonadherence with Benicar/[employer]   Imported By: Lanelle Bal 02/03/2010 13:31:57  _____________________________________________________________________  External Attachment:    Type:   Image     Comment:   External Document

## 2010-05-06 NOTE — Progress Notes (Signed)
Summary: Simvastatin Refill  Phone Note Refill Request Message from:  Fax from Pharmacy on May 07, 2009 9:30 AM  Refills Requested: Medication #1:  SIMVASTATIN 20 MG  TABS 1 by mouth once daily   Dosage confirmed as above?Dosage Confirmed   Brand Name Necessary? No   Supply Requested: 1 month   Last Refilled: 04/03/2009  Method Requested: Electronic Next Appointment Scheduled: none Initial call taken by: Roselle Locus,  May 07, 2009 9:31 AM  Follow-up for Phone Call        Rx completed in Dr. Tiajuana Amass Follow-up by: Glendell Docker CMA,  May 07, 2009 3:23 PM    Prescriptions: SIMVASTATIN 20 MG  TABS (SIMVASTATIN) 1 by mouth once daily  #30 x 0   Entered by:   Glendell Docker CMA   Authorized by:   D. Thomos Lemons DO   Signed by:   Glendell Docker CMA on 05/07/2009   Method used:   Electronically to        [facility] [address] (retail)       [address] [place], [place]  [address]       Ph: [phone] or [phone]       Fax: [phone]   RxID:   [id number]

## 2010-05-06 NOTE — Assessment & Plan Note (Signed)
Summary: rto 2 weeks.cbs   Vital Signs:  Patient profile:   50 year old female Weight:      216 pounds Temp:     98.6 degrees F 0 Pulse rate:   80 / minute Pulse rhythm:   regular BP sitting:   110 / 70  (right arm)  Vitals Entered By: Almeta Monas CMA Duncan Dull) (November 25, 2009 12:59 PM) CC: 2 wk f/u with labs, pt fasting not taking the prozac    History of Present Illness: Pt here f/u depression--- she stopped the prozac because of bad dreams but is doing much better and does not feel like she needs another med.  Pt is also here for labs and bp check.  No other complaints.   Current Medications (verified): 1)  Zocor 40 Mg Tabs (Simvastatin) .Marland Kitchen.. 1 By Mouth At Bedtime. 2)  Fish Oil Concentrate 1000 Mg  Caps (Omega-3 Fatty Acids) 3)  Benicar Hct 20-12.5 Mg Tabs (Olmesartan Medoxomil-Hctz) .... 1/2  Tab By Mouth Once Daily 4)  Nexium 40 Mg Cpdr (Esomeprazole Magnesium) .Marland Kitchen.. 1 By Mouth Once Daily 5)  Antivert 25 Mg Tabs (Meclizine Hcl) 6)  Ultram 50 Mg Tabs (Tramadol Hcl) .... 1/2 - 1 By Mouth Every 6 Hours As Needed 7)  Flector 1.3 % Ptch (Diclofenac Epolamine) .Marland Kitchen.. 1 Patch Q Day 8)  Flexeril 10 Mg Tabs (Cyclobenzaprine Hcl) .Marland Kitchen.. 1 By Mouth Three Times A Day 9)  Back Brace Specifically For Lumbar Support .... Dx  Back Pain1 10)  Grapeseed Complex 11)  Black Cohosh 40 Mg Caps (Black Cohosh) 12)  Mvi Womens One A Day .Marland Kitchen.. 1 By Mouth Once Daily  Allergies (verified): No Known Drug Allergies  Past History:  Past medical, surgical, family and social histories (including risk factors) reviewed for relevance to current acute and chronic problems.  Past Medical History: Reviewed history from 09/27/2008 and no changes required. GERD Hyperlipidemia    vitamin D deficiency  TOBACCO USE (ICD-305.1)  MIGRAINE (ICD-346.90) Hypertension Low back pain  Past Surgical History: Reviewed history from 08/26/2009 and no changes required.  Tonsillectomy     Family History: Reviewed  history from 08/26/2009 and no changes required. father  died 93 of mi mom cabg  pacer   60s   sis dm  died  sis  died   50yo-- "heart stopped" Family History Diabetes 1st degree relative ? gm breast cancer  MOM arthritis crippling  and gm  Family History of Arthritis Family History of CAD Female 1st degree relative <60   Family History of CAD Female 1st degree relative <50         Social History: Reviewed history from 09/27/2008 and no changes required. Divorced  Current Smoker 1ppd     Alcohol use-no Drug use-no    Regular exercise-no coffee every day  ..   2 perday hh of 5   children and mom      works 7pm-3am    at post office   sleep irregular.    Review of Systems      See HPI  Physical Exam  General:  Well-developed,well-nourished,in no acute distress; alert,appropriate and cooperative throughout examination Neck:  No deformities, masses, or tenderness noted. Lungs:  Normal respiratory effort, chest expands symmetrically. Lungs are clear to auscultation, no crackles or wheezes. Heart:  normal rate and no murmur.   Extremities:  No clubbing, cyanosis, edema, or deformity noted with normal full range of motion of all joints.   Skin:  Intact without  suspicious lesions or rashes Psych:  Cognition and judgment appear intact. Alert and cooperative with normal attention span and concentration. No apparent delusions, illusions, hallucinations   Impression & Recommendations:  Problem # 1:  HYPERTENSION (ICD-401.9)  Her updated medication list for this problem includes:    Benicar Hct 20-12.5 Mg Tabs (Olmesartan medoxomil-hctz) .Marland Kitchen... 1/2  tab by mouth once daily  Orders: Venipuncture (82956) TLB-Lipid Panel (80061-LIPID) TLB-BMP (Basic Metabolic Panel-BMET) (80048-METABOL) TLB-Hepatic/Liver Function Pnl (80076-HEPATIC) Specimen Handling (21308)  BP today: 110/70 Prior BP: 120/58 (11/12/2009)  Labs Reviewed: K+: 5.0 (08/26/2009) Creat: : 0.8 (08/26/2009)   Chol:  196 (08/26/2009)   HDL: 55.90 (08/26/2009)   LDL: 123 (08/26/2009)   TG: 87.0 (08/26/2009)  Problem # 2:  HYPERLIPIDEMIA (ICD-272.4)  Her updated medication list for this problem includes:    Zocor 40 Mg Tabs (Simvastatin) .Marland Kitchen... 1 by mouth at bedtime.  Orders: Venipuncture (65784) TLB-Lipid Panel (80061-LIPID) TLB-BMP (Basic Metabolic Panel-BMET) (80048-METABOL) TLB-Hepatic/Liver Function Pnl (80076-HEPATIC) Specimen Handling (69629)  Labs Reviewed: SGOT: 14 (08/26/2009)   SGPT: 16 (08/26/2009)   HDL:55.90 (08/26/2009), 57.70 (05/24/2009)  LDL:123 (08/26/2009), 105 (05/24/2009)  Chol:196 (08/26/2009), 172 (05/24/2009)  Trig:87.0 (08/26/2009), 46.0 (05/24/2009)  Problem # 3:  DEPRESSION, MILD (ICD-311) Assessment: Improved  The following medications were removed from the medication list:    Prozac 10 Mg Caps (Fluoxetine hcl) .Marland Kitchen... 1 by mouth once daily  Complete Medication List: 1)  Zocor 40 Mg Tabs (Simvastatin) .Marland Kitchen.. 1 by mouth at bedtime. 2)  Fish Oil Concentrate 1000 Mg Caps (Omega-3 fatty acids) 3)  Benicar Hct 20-12.5 Mg Tabs (Olmesartan medoxomil-hctz) .... 1/2  tab by mouth once daily 4)  Nexium 40 Mg Cpdr (Esomeprazole magnesium) .Marland Kitchen.. 1 by mouth once daily 5)  Antivert 25 Mg Tabs (Meclizine hcl) 6)  Ultram 50 Mg Tabs (Tramadol hcl) .... 1/2 - 1 by mouth every 6 hours as needed 7)  Flector 1.3 % Ptch (Diclofenac epolamine) .Marland Kitchen.. 1 patch q day 8)  Flexeril 10 Mg Tabs (Cyclobenzaprine hcl) .Marland Kitchen.. 1 by mouth three times a day 9)  Back Brace Specifically For Lumbar Support  .... Dx  back pain1 10)  Grapeseed Complex  11)  Black Cohosh 40 Mg Caps (Black cohosh) 12)  Mvi Womens One A Day  .Marland KitchenMarland Kitchen. 1 by mouth once daily  Patient Instructions: 1)  Please schedule a follow-up appointment in 3 months .  Prescriptions: NEXIUM 40 MG CPDR (ESOMEPRAZOLE MAGNESIUM) 1 by mouth once daily  #30 x 11   Entered and Authorized by:   Loreen Freud DO   Signed by:   Loreen Freud DO on 11/25/2009    Method used:   Print then Give to Patient   RxID:   [id number]

## 2010-05-06 NOTE — Progress Notes (Signed)
Summary: refill  Phone Note Refill Request Message from:  Fax from Pharmacy on July 19, 2009 4:43 PM  Refills Requested: Medication #1:  BENICAR HCT 20-12.5 MG  TABS 1 by mouth once daily [facility] [address] fax [phone]   Method Requested: Fax to Local Pharmacy Next Appointment Scheduled: no appt Initial call taken by: Barb Merino,  July 19, 2009 4:43 PM    Prescriptions: BENICAR HCT 20-12.5 MG  TABS (OLMESARTAN MEDOXOMIL-HCTZ) 1 by mouth once daily  #30 Tablet x 2   Entered by:   Army Fossa CMA   Authorized by:   Loreen Freud DO   Signed by:   Army Fossa CMA on 07/19/2009   Method used:   Electronically to        [facility] [address] (retail)       [address] [place], [place]  [address]       Ph: [phone] or [phone]       Fax: [phone]   RxID:   [id number]

## 2010-05-06 NOTE — Letter (Signed)
Summary: Duty Status Report/[employer]PS  Duty Status Report/[employer]PS   Imported By: Lanelle Bal 07/25/2009 11:46:49  _____________________________________________________________________  External Attachment:    Type:   Image     Comment:   External Document

## 2010-05-06 NOTE — Assessment & Plan Note (Signed)
Summary: back spasms//lch   Vital Signs:  Patient profile:   50 year old female Height:      64 inches Weight:      215 pounds BMI:     37.04 Pulse rate:   90 / minute Pulse rhythm:   regular BP sitting:   116 / 80  (left arm) Cuff size:   regular  Vitals Entered By: Army Fossa CMA (July 22, 2009 11:39 AM) CC: Pt here for back spasms, injured back a year ago at work. Now having spasms, and a pressure. , Back Pain   History of Present Illness:       This is a 50 year old woman who presents with Back Pain.  The symptoms began 5 days ago.  Pt states her back started spasming since thursday.  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  She did better on fork lift.  The patient denies fever, chills, weakness, loss of sensation, fecal incontinence, urinary incontinence, urinary retention, dysuria, rest pain, inability to work, and inability to care for self.  The pain is located in the right thoracic back and left thoracic back.  The pain began at work, gradually, and after straining.  The pain is made better by inactivity and heat.    Current Medications (verified): 1)  Simvastatin 20 Mg  Tabs (Simvastatin) .Marland Kitchen.. 1 By Mouth Once Daily 2)  Fish Oil Concentrate 1000 Mg  Caps (Omega-3 Fatty Acids) 3)  Benicar Hct 20-12.5 Mg  Tabs (Olmesartan Medoxomil-Hctz) .Marland Kitchen.. 1 By Mouth Once Daily 4)  Prilosec Otc 20 Mg Tbec (Omeprazole Magnesium) 5)  D 1000 Plus  Tabs (Fa-Cyanocobalamin-B6-D-Ca) .... Take 1 Tablet By Mouth Once A Day 6)  Antivert 25 Mg Tabs (Meclizine Hcl) 7)  Ultram 50 Mg Tabs (Tramadol Hcl) .... 1/2 - 1 By Mouth Every 6 Hours As Needed 8)  Ginger Pills 9)  Flector 1.3 % Ptch (Diclofenac Epolamine) .Marland Kitchen.. 1 Patch Q Day 10)  Flexeril 10 Mg Tabs (Cyclobenzaprine Hcl) .Marland Kitchen.. 1 By Mouth Three Times A Day  Allergies (verified): No Known Drug Allergies  Past History:  Past medical, surgical, family and social histories (including risk  factors) reviewed for relevance to current acute and chronic problems.  Past Medical History: Reviewed history from 09/27/2008 and no changes required. GERD Hyperlipidemia    vitamin D deficiency  TOBACCO USE (ICD-305.1)  MIGRAINE (ICD-346.90) Hypertension Low back pain  Past Surgical History: Reviewed history from 09/27/2008 and no changes required. Denies surgical history    Tonsillectomy     Family History: Reviewed history from 09/27/2008 and no changes required. father  died 52 of mi mom cabg  pacer   60s   sis dm  died  sis  died in or for gyne      50 Family History Diabetes 1st degree relative ? gm breast cancer  MOM arthritis crippling  and gm  Family History of Arthritis Family History of CAD Female 1st degree relative <60   Family History of CAD Female 1st degree relative <50         Social History: Reviewed history from 09/27/2008 and no changes required. Divorced  Current Smoker 1ppd     Alcohol use-no Drug use-no    Regular exercise-no coffee every day  ..   2 perday hh of 5   children and mom      works 7pm-3am    at post office   sleep irregular.  Review of Systems      See HPI  Physical Exam  General:  Well-developed,well-nourished,in no acute distress; alert,appropriate and cooperative throughout examination Msk:  normal ROM, no joint tenderness, no joint swelling, no joint warmth, no redness over joints, no joint deformities, no joint instability, and no crepitation.   Extremities:  No clubbing, cyanosis, edema, or deformity noted with normal full range of motion of all joints.   Neurologic:  cranial nerves II-XII intact, strength normal in all extremities, gait normal, and DTRs symmetrical and normal.   Psych:  Cognition and judgment appear intact. Alert and cooperative with normal attention span and concentration. No apparent delusions, illusions, hallucinations   Impression & Recommendations:  Problem # 1:  BACK PAIN, THORACIC REGION  (ICD-724.1)  Her updated medication list for this problem includes:    Ultram 50 Mg Tabs (Tramadol hcl) .Marland Kitchen... 1/2 - 1 by mouth every 6 hours as needed    Flexeril 10 Mg Tabs (Cyclobenzaprine hcl) .Marland Kitchen... 1 by mouth three times a day    con't with patches as needed      Discussed use of moist heat or ice, modified activities, medications, and stretching/strengthening exercises. Back care instructions given. To be seen in 2 weeks if no improvement; sooner if worsening of symptoms.   Complete Medication List: 1)  Simvastatin 20 Mg Tabs (Simvastatin) .Marland Kitchen.. 1 by mouth once daily 2)  Fish Oil Concentrate 1000 Mg Caps (Omega-3 fatty acids) 3)  Benicar Hct 20-12.5 Mg Tabs (Olmesartan medoxomil-hctz) .Marland Kitchen.. 1 by mouth once daily 4)  Prilosec Otc 20 Mg Tbec (Omeprazole magnesium) 5)  D 1000 Plus Tabs (Fa-cyanocobalamin-b6-d-ca) .... Take 1 tablet by mouth once a day 6)  Antivert 25 Mg Tabs (Meclizine hcl) 7)  Ultram 50 Mg Tabs (Tramadol hcl) .... 1/2 - 1 by mouth every 6 hours as needed 8)  Ginger Pills  9)  Flector 1.3 % Ptch (Diclofenac epolamine) .Marland Kitchen.. 1 patch q day 10)  Flexeril 10 Mg Tabs (Cyclobenzaprine hcl) .Marland Kitchen.. 1 by mouth three times a day Prescriptions: ULTRAM 50 MG TABS (TRAMADOL HCL) 1/2 - 1 by mouth every 6 hours as needed  #30 x 0   Entered and Authorized by:   Loreen Freud DO   Signed by:   Loreen Freud DO on 07/22/2009   Method used:   Electronically to        [facility] [address] (retail)       [address] [place], [place]  [address]       Ph: [phone] or [phone]       Fax: [phone]   RxID:   [id number] FLEXERIL 10 MG TABS (CYCLOBENZAPRINE HCL) 1 by mouth three times a day  #30 x 0   Entered and Authorized by:   Loreen Freud DO   Signed by:   Loreen Freud DO on 07/22/2009   Method used:   Electronically to        [facility] [address] (retail)       [address] [place], [place]  [address]       Ph:  [phone] or [phone]       Fax: [phone]   RxID:   [id number] FLECTOR 1.3 % PTCH (DICLOFENAC EPOLAMINE) 1 patch q day  #10 x 0   Entered and Authorized  by:   Loreen Freud DO   Signed by:   Loreen Freud DO on 07/22/2009   Method used:   Electronically to        [facility] [address] (retail)       [address] [place], [place]  [address]       Ph: [phone] or [phone]       Fax: [phone]   RxID:   [id number]

## 2010-05-06 NOTE — Progress Notes (Signed)
Summary: FMLA  Phone Note Call from Patient Call back at [facility] Phone [phone]   Summary of Call: Patient called to check the status of her FMLA papers. Please advise. Initial call taken by: Lucious Groves CMA,  November 07, 2009 12:38 PM  Follow-up for Phone Call        I haven't gotten any fmla papers Follow-up by: Loreen Freud DO,  November 07, 2009 5:16 PM  Additional Follow-up for Phone Call Additional follow up Details #1::        left pt detail message paper work not received pls return call to discuss...............Marland KitchenFelecia Deloach CMA  November 08, 2009 10:42 AM   Patient returned call stating that she had not dropped them off yet. I made patient aware to bring the to the front desk. Lucious Groves CMA  November 08, 2009 10:46 AM

## 2010-05-06 NOTE — Assessment & Plan Note (Signed)
Summary: PAP ONLY/CBS   Vital Signs:  Patient profile:   50 year old female Weight:      212 pounds Pulse rate:   88 / minute Pulse rhythm:   regular BP sitting:   110 / 70  (left arm) Cuff size:   regular  Vitals Entered By: Army Fossa CMA (Aug 26, 2009 10:00 AM) CC: Pt here for pap only   History of Present Illness: Pt here for cpe and pap with labs.  Hyperlipidemia follow-up      This is a 50 year old woman who presents for Hyperlipidemia follow-up.  The patient denies muscle aches, GI upset, abdominal pain, flushing, itching, constipation, diarrhea, and fatigue.  The patient denies the following symptoms: chest pain/pressure, exercise intolerance, dypsnea, palpitations, syncope, and pedal edema.  Compliance with medications (by patient report) has been near 100%.  Dietary compliance has been fair.  The patient reports no exercise.  Adjunctive measures currently used by the patient include ASA, fish oil supplements, limiting alcohol consumpton, and weight reduction.    Hypertension follow-up      The patient also presents for Hypertension follow-up.  The patient denies lightheadedness, urinary frequency, headaches, edema, impotence, rash, and fatigue.  The patient denies the following associated symptoms: chest pain, chest pressure, exercise intolerance, dyspnea, palpitations, syncope, leg edema, and pedal edema.  Compliance with medications (by patient report) has been near 100%.  The patient reports that dietary compliance has been fair.  The patient reports no exercise.  Adjunctive measures currently used by the patient include salt restriction.    Preventive Screening-Counseling & Management  Alcohol-Tobacco     Alcohol drinks/day: none     Smoking Status: current     Smoking Cessation Counseling: yes     Smoke Cessation Stage: not ready     Packs/Day: 1     Year Started: 1975     Cigars/week: 1 pack a day     Passive Smoke Exposure: no  Caffeine-Diet-Exercise  Caffeine use/day: none     Does Patient Exercise: no  Hep-HIV-STD-Contraception     Dental Visit-last 6 months no     Dental Care Counseling: dentures     SBE monthly: no     SBE Education/Counseling: to perform regular SBE  Safety-Violence-Falls     Seat Belt Use: yes      Sexual History:  currently monogamous and married.    Current Medications (verified): 1)  Simvastatin 20 Mg  Tabs (Simvastatin) .Marland Kitchen.. 1 By Mouth Once Daily 2)  Fish Oil Concentrate 1000 Mg  Caps (Omega-3 Fatty Acids) 3)  Benicar Hct 20-12.5 Mg  Tabs (Olmesartan Medoxomil-Hctz) .Marland Kitchen.. 1 By Mouth Once Daily 4)  Prilosec Otc 20 Mg Tbec (Omeprazole Magnesium) 5)  D 1000 Plus  Tabs (Fa-Cyanocobalamin-B6-D-Ca) .... Take 1 Tablet By Mouth Once A Day 6)  Antivert 25 Mg Tabs (Meclizine Hcl) 7)  Ultram 50 Mg Tabs (Tramadol Hcl) .... 1/2 - 1 By Mouth Every 6 Hours As Needed 8)  Ginger Pills 9)  Flector 1.3 % Ptch (Diclofenac Epolamine) .Marland Kitchen.. 1 Patch Q Day 10)  Flexeril 10 Mg Tabs (Cyclobenzaprine Hcl) .Marland Kitchen.. 1 By Mouth Three Times A Day 11)  Back Brace Specifically For Lumbar Support .... Dx  Back Pain1 12)  Vitamin A 13)  Grapeseed Complex  Allergies (verified): No Known Drug Allergies  Past History:  Past medical, surgical, family and social histories (including risk factors) reviewed for relevance to current acute and chronic problems.  Past Medical History: Reviewed  history from 09/27/2008 and no changes required. GERD Hyperlipidemia    vitamin D deficiency  TOBACCO USE (ICD-305.1)  MIGRAINE (ICD-346.90) Hypertension Low back pain  Past Surgical History:  Tonsillectomy     Family History: Reviewed history from 09/27/2008 and no changes required. father  died 52 of mi mom cabg  pacer   60s   sis dm  died  sis  died   50yo-- "heart stopped" Family History Diabetes 1st degree relative ? gm breast cancer  MOM arthritis crippling  and gm  Family History of Arthritis Family History of CAD Female 1st  degree relative <60   Family History of CAD Female 1st degree relative <50         Social History: Reviewed history from 09/27/2008 and no changes required. Divorced  Current Smoker 1ppd     Alcohol use-no Drug use-no    Regular exercise-no coffee every day  ..   2 perday hh of 5   children and mom      works 7pm-3am    at [employer]   sleep irregular.   Dental Care w/in 6 mos.:  no Sexual History:  currently monogamous, married  Review of Systems      See HPI General:  Denies chills, fatigue, fever, loss of appetite, malaise, sleep disorder, sweats, weakness, and weight loss. Eyes:  Denies blurring, discharge, double vision, eye irritation, eye pain, halos, itching, light sensitivity, red eye, vision loss-1 eye, and vision loss-both eyes; optho--q2y. ENT:  Denies decreased hearing, difficulty swallowing, ear discharge, earache, hoarseness, nasal congestion, nosebleeds, postnasal drainage, ringing in ears, sinus pressure, and sore throat. CV:  Denies bluish discoloration of lips or nails, chest pain or discomfort, difficulty breathing at night, difficulty breathing while lying down, fainting, fatigue, leg cramps with exertion, lightheadness, near fainting, palpitations, shortness of breath with exertion, swelling of feet, swelling of hands, and weight gain. Resp:  Denies chest discomfort, chest pain with inspiration, cough, coughing up blood, excessive snoring, hypersomnolence, morning headaches, pleuritic, shortness of breath, sputum productive, and wheezing. GI:  Denies abdominal pain, bloody stools, change in bowel habits, constipation, dark tarry stools, diarrhea, excessive appetite, gas, hemorrhoids, indigestion, loss of appetite, and nausea. GU:  Denies abnormal vaginal bleeding, decreased libido, discharge, dysuria, genital sores, hematuria, incontinence, nocturia, urinary frequency, and urinary hesitancy. MS:  Denies joint pain, joint redness, joint swelling, loss of strength, low  back pain, mid back pain, muscle aches, muscle , cramps, muscle weakness, stiffness, and thoracic pain. Derm:  Denies changes in color of skin, changes in nail beds, dryness, excessive perspiration, flushing, hair loss, insect bite(s), itching, lesion(s), poor wound healing, and rash. Neuro:  Denies brief paralysis, difficulty with concentration, disturbances in coordination, falling down, headaches, inability to speak, memory loss, numbness, poor balance, seizures, sensation of room spinning, tingling, tremors, visual disturbances, and weakness. Psych:  Denies alternate hallucination ( auditory/visual), anxiety, depression, easily angered, easily tearful, irritability, mental problems, panic attacks, sense of great danger, suicidal thoughts/plans, thoughts of violence, unusual visions or sounds, and thoughts /plans of harming others. Endo:  Denies cold intolerance, excessive hunger, excessive thirst, excessive urination, heat intolerance, polyuria, and weight change. Heme:  Denies abnormal bruising, bleeding, enlarge lymph nodes, fevers, pallor, and skin discoloration. Allergy:  Denies hives or rash, itching eyes, persistent infections, seasonal allergies, and sneezing.  Physical Exam  General:  Well-developed,well-nourished,in no acute distress; alert,appropriate and cooperative throughout examination Head:  Normocephalic and atraumatic without obvious abnormalities. No apparent alopecia or balding. Eyes:  pupils  equal, pupils round, pupils reactive to light, and no injection.   Ears:  External ear exam shows no significant lesions or deformities.  Otoscopic examination reveals clear canals, tympanic membranes are intact bilaterally without bulging, retraction, inflammation or discharge. Hearing is grossly normal bilaterally. Nose:  External nasal examination shows no deformity or inflammation. Nasal mucosa are pink and moist without lesions or exudates. Mouth:  Oral mucosa and oropharynx without  lesions or exudates.  Teeth in good repair. Neck:  No deformities, masses, or tenderness noted. Chest Wall:  No deformities, masses, or tenderness noted. Breasts:  No mass, nodules, thickening, tenderness, bulging, retraction, inflamation, nipple discharge or skin changes noted.   Lungs:  Normal respiratory effort, chest expands symmetrically. Lungs are clear to auscultation, no crackles or wheezes. Heart:  normal rate and no murmur.   Abdomen:  Bowel sounds positive,abdomen soft and non-tender without masses, organomegaly or hernias noted. Rectal:  No external abnormalities noted. Normal sphincter tone. No rectal masses or tenderness. heme neg brown stool Genitalia:  Pelvic Exam:        External: normal female genitalia without lesions or masses        Vagina: normal without lesions or masses        Cervix: normal without lesions or masses        Adnexa: normal bimanual exam without masses or fullness        Uterus: normal by palpation        Pap smear: performed Msk:  normal ROM, no joint tenderness, no joint swelling, no joint warmth, no redness over joints, no joint deformities, no joint instability, and no crepitation.   Pulses:  R posterior tibial normal, R dorsalis pedis normal, R carotid normal, L posterior tibial normal, L dorsalis pedis normal, and L carotid normal.   Extremities:  No clubbing, cyanosis, edema, or deformity noted with normal full range of motion of all joints.   Neurologic:  No cranial nerve deficits noted. Station and gait are normal. Plantar reflexes are down-going bilaterally. DTRs are symmetrical throughout. Sensory, motor and coordinative functions appear intact. Skin:  Intact without suspicious lesions or rashes Cervical Nodes:  No lymphadenopathy noted Axillary Nodes:  No palpable lymphadenopathy Psych:  Cognition and judgment appear intact. Alert and cooperative with normal attention span and concentration. No apparent delusions, illusions,  hallucinations   Impression & Recommendations:  Problem # 1:  PREVENTIVE HEALTH CARE (ICD-V70.0)  Orders: EKG w/ Interpretation (93000)  Problem # 2:  HYPERTENSION (ICD-401.9)  Her updated medication list for this problem includes:    Benicar Hct 20-12.5 Mg Tabs (Olmesartan medoxomil-hctz) .Marland Kitchen... 1 by mouth once daily  Orders: Venipuncture (09811) TLB-Lipid Panel (80061-LIPID) TLB-BMP (Basic Metabolic Panel-BMET) (80048-METABOL) TLB-CBC Platelet - w/Differential (85025-CBCD) TLB-Hepatic/Liver Function Pnl (80076-HEPATIC) TLB-TSH (Thyroid Stimulating Hormone) (84443-TSH) T-Vitamin D (25-Hydroxy) (91478-29562) EKG w/ Interpretation (93000)  Problem # 3:  UNSPECIFIED VITAMIN D DEFICIENCY (ICD-268.9)  Orders: Venipuncture (13086) TLB-Lipid Panel (80061-LIPID) TLB-BMP (Basic Metabolic Panel-BMET) (80048-METABOL) TLB-CBC Platelet - w/Differential (85025-CBCD) TLB-Hepatic/Liver Function Pnl (80076-HEPATIC) TLB-TSH (Thyroid Stimulating Hormone) (84443-TSH) T-Vitamin D (25-Hydroxy) (57846-96295)  Problem # 4:  TOBACCO USE (ICD-305.1)  Orders: Venipuncture (28413) TLB-Lipid Panel (80061-LIPID) TLB-BMP (Basic Metabolic Panel-BMET) (80048-METABOL) TLB-CBC Platelet - w/Differential (85025-CBCD) TLB-Hepatic/Liver Function Pnl (80076-HEPATIC) TLB-TSH (Thyroid Stimulating Hormone) (84443-TSH) T-Vitamin D (25-Hydroxy) (24401-02725) EKG w/ Interpretation (93000)  Problem # 5:  HYPERLIPIDEMIA (ICD-272.4)  Her updated medication list for this problem includes:    Simvastatin 20 Mg Tabs (Simvastatin) .Marland Kitchen... 1 by mouth once daily  Orders:  Venipuncture [phone]) TLB-Lipid Panel (80061-LIPID) TLB-BMP (Basic Metabolic Panel-BMET) (80048-METABOL) TLB-CBC Platelet - w/Differential (85025-CBCD) TLB-Hepatic/Liver Function Pnl (80076-HEPATIC) TLB-TSH (Thyroid Stimulating Hormone) (84443-TSH) T-Vitamin D (25-Hydroxy) (98119-14782) EKG w/ Interpretation (93000)  Labs Reviewed: SGOT: 16  (05/24/2009)   SGPT: 17 (05/24/2009)   HDL:57.70 (05/24/2009), 44.9 (09/16/2007)  LDL:105 (05/24/2009), 101 (09/16/2007)  Chol:172 (05/24/2009), 163 (09/16/2007)  Trig:46.0 (05/24/2009), 84 (09/16/2007)  Complete Medication List: 1)  Simvastatin 20 Mg Tabs (Simvastatin) .Marland Kitchen.. 1 by mouth once daily 2)  Fish Oil Concentrate 1000 Mg Caps (Omega-3 fatty acids) 3)  Benicar Hct 20-12.5 Mg Tabs (Olmesartan medoxomil-hctz) .Marland Kitchen.. 1 by mouth once daily 4)  Prilosec Otc 20 Mg Tbec (Omeprazole magnesium) 5)  D 1000 Plus Tabs (Fa-cyanocobalamin-b6-d-ca) .... Take 1 tablet by mouth once a day 6)  Antivert 25 Mg Tabs (Meclizine hcl) 7)  Ultram 50 Mg Tabs (Tramadol hcl) .... 1/2 - 1 by mouth every 6 hours as needed 8)  Ginger Pills  9)  Flector 1.3 % Ptch (Diclofenac epolamine) .Marland Kitchen.. 1 patch q day 10)  Flexeril 10 Mg Tabs (Cyclobenzaprine hcl) .Marland Kitchen.. 1 by mouth three times a day 11)  Back Brace Specifically For Lumbar Support  .... Dx  back pain1 12)  Vitamin A  13)  Grapeseed Complex   Other Orders: Radiology Referral (Radiology) Tdap => 25yrs IM (95621) Admin 1st Vaccine (30865)  Patient Instructions: 1)  rto 6 months   EKG  Procedure date:  08/26/2009  Findings:      Normal sinus rhythm with rate of:  68 bpm   EKG  Procedure date:  08/26/2009  Findings:      Normal sinus rhythm with rate of:  60 Inc RBBB   Immunizations Administered:  Tetanus Vaccine:    Vaccine Type: Tdap    Site: right deltoid    Mfr: GlaxoSmithKline    Dose: 0.5 ml    Route: IM    Given by: Army Fossa CMA    Exp. Date: 06/29/2011    Lot #: [id number]     Flu Vaccine Next Due:  Not Indicated   Immunizations Administered:  Tetanus Vaccine:    Vaccine Type: Tdap    Site: right deltoid    Mfr: GlaxoSmithKline    Dose: 0.5 ml    Route: IM    Given by: Army Fossa CMA    Exp. Date: 06/29/2011    Lot #: [id number]   Appended Document: PAP ONLY/CBS  Laboratory Results   Urine  Tests   Date/Time Reported: Aug 26, 2009 12:35 PM   Routine Urinalysis   Color: yellow Appearance: Clear Glucose: negative   (Normal Range: Negative) Bilirubin: negative   (Normal Range: Negative) Ketone: negative   (Normal Range: Negative) Spec. Gravity: 1.025   (Normal Range: 1.003-1.035) Blood: negative   (Normal Range: Negative) pH: 5.0   (Normal Range: 5.0-8.0) Protein: negative   (Normal Range: Negative) Urobilinogen: negative   (Normal Range: 0-1) Nitrite: negative   (Normal Range: Negative) Leukocyte Esterace: negative   (Normal Range: Negative)    CommentsFloydene Flock  Aug 26, 2009 12:36 PM

## 2010-05-08 NOTE — Progress Notes (Signed)
Summary: Results 1/24  Phone Note Outgoing Call   Call placed by: Almeta Monas CMA Duncan Dull),  April 29, 2010 11:12 AM Call placed to: Patient Details for Reason: arthritic changes---- ? bladder stones Pt c/o increasing pain and trouble walking Summary of Call: spoke with pat and she is aware of the results.Marland KitchenMarland KitchenMarland KitchenStated that she still can not walk, and is in a lot of pain. wanted to get something for pain and extended work note until after the MRI....please advise Initial call taken by: Almeta Monas CMA Duncan Dull),  April 29, 2010 11:12 AM  Follow-up for Phone Call        ok to give note ultram 50 mg 1-2 by mouth every 6 hours as needed #30    Follow-up by: Loreen Freud DO,  April 29, 2010 11:59 AM    Prescriptions: Janean Sark 50 MG TABS (TRAMADOL HCL) 1/2 - 1 by mouth every 6 hours as needed  #30 x 0   Entered and Authorized by:   Loreen Freud DO   Signed by:   Loreen Freud DO on 04/29/2010   Method used:   Electronically to        [facility] (retail)       [address]       [place], [place]  [address]       Ph: [phone]       Fax: [phone]   RxID:   [id number]

## 2010-05-08 NOTE — Progress Notes (Signed)
Summary: still no better   Phone Note Call from Patient Call back at Home Phone [phone]   Caller: Patient Summary of Call: Pt seen on last week and is still no better. Pt still c/o increasing lower back pain that is making it difficult to walk. Pls advise.Marland KitchenMarland KitchenFelecia Deloach CMA  April 28, 2010 9:34 AM   Follow-up for Phone Call        Pt should get the xrays done ---I gave her order at [facility]---  Follow-up by: Loreen Freud DO,  April 28, 2010 9:40 AM  Additional Follow-up for Phone Call Additional follow up Details #1::        pt stated her buttocks is what is hurting, stated the left cheek is painful when she walks, wants to know what to do, she said her back is better. I advised patient to go ahead and get the Xray and that would get [place] started,and if negative Dr.Lownce  can proceed from there..  she voiced understanding, stated she would need a work note for tonight because she is not going to be able to go to work tonight ecaus eof the pain...please advise Additional Follow-up by: Almeta Monas CMA Duncan Dull),  April 28, 2010 2:50 PM    Additional Follow-up for Phone Call Additional follow up Details #2::    ok to give note for today Follow-up by: Loreen Freud DO,  April 28, 2010 3:25 PM  Additional Follow-up for Phone Call Additional follow up Details #3:: Details for Additional Follow-up Action Taken: Pt aware note placed up front for pick up...............Marland KitchenFelecia Deloach CMA  April 28, 2010 4:59 PM

## 2010-05-08 NOTE — Progress Notes (Signed)
Summary: note for work  Phone Note Call from Patient Call back at [employer] [phone]   Caller: Patient Summary of Call: Pt needs noted dated for 04-29-10 until 04-30-10. Pt states that she was advise not to return to work until saw neuro. Pt states that she saw them today and was taking out another 3 weeks ...Marland KitchenMarland KitchenFelecia Deloach CMA  May 01, 2010 5:03 PM   Follow-up for Phone Call        ok to give her note Follow-up by: Loreen Freud DO,  May 01, 2010 8:25 PM  Additional Follow-up for Phone Call Additional follow up Details #1::        pt aware work note ready Additional Follow-up by: Almeta Monas CMA Duncan Dull),  May 02, 2010 8:05 AM

## 2010-05-08 NOTE — Letter (Signed)
Summary: Out of Work  [facility] [employer]  [address] [place], [place] [address]   Phone: [phone]  Fax: [phone]    May 02, 2010     Employee:  Earnie Larsson    To Whom It May Concern:   For Medical reasons, please excuse the above named employee from work for the following dates:  Start:   04/29/2010  End:   04/30/2010  If you need additional information, please feel free to contact our office.         Sincerely,         Loreen Freud DO

## 2010-05-08 NOTE — Assessment & Plan Note (Signed)
Summary: FOR BACK PAIN//PH   Vital Signs:  Patient profile:   50 year old female Height:      64 inches Weight:      218.4 pounds BMI:     37.62 Temp:     98.6 degrees F oral Pulse rate:   72 / minute Pulse rhythm:   regular BP sitting:   130 / 70  (right arm) Cuff size:   large  Vitals Entered By: Almeta Monas CMA Duncan Dull) (April 23, 2010 11:40 AM) CC: C/O LBP--No relief from meds, Back Pain   History of Present Illness:       This is a 50 year old woman who presents with Back Pain.  The symptoms began 1 day ago.  Pt states that her back suddenly started to hurt after putting clothes in dryer.  Pt used pain patch, heat, voltaren gel with no relief.  She took 2 aleve today which took the edge off only.  The patient denies fever, chills, weakness, loss of sensation, fecal incontinence, urinary incontinence, urinary retention, dysuria, rest pain, inability to work, and inability to care for self.  The pain is located in the mid low back.  The pain began at home and suddenly.  The pain radiates to the left hip and left buttock.  The pain is made worse by standing or walking, flexion, and activity.  The pain is made better by inactivity.    Current Medications (verified): 1)  Zocor 40 Mg Tabs (Simvastatin) .Marland Kitchen.. 1 By Mouth At Bedtime. 2)  Fish Oil Concentrate 1000 Mg  Caps (Omega-3 Fatty Acids) 3)  Benicar Hct 20-12.5 Mg Tabs (Olmesartan Medoxomil-Hctz) .... 1/2  Tab By Mouth Once Daily 4)  Nexium 40 Mg Cpdr (Esomeprazole Magnesium) .Marland Kitchen.. 1 By Mouth Once Daily 5)  Antivert 25 Mg Tabs (Meclizine Hcl) 6)  Ultram 50 Mg Tabs (Tramadol Hcl) .... 1/2 - 1 By Mouth Every 6 Hours As Needed 7)  Flector 1.3 % Ptch (Diclofenac Epolamine) .Marland Kitchen.. 1 Patch Q Day 8)  Flexeril 10 Mg Tabs (Cyclobenzaprine Hcl) .Marland Kitchen.. 1 By Mouth Three Times A Day 9)  Back Brace Specifically For Lumbar Support .... Dx  Back Pain1 10)  Grapeseed Complex 11)  Black Cohosh 40 Mg Caps (Black Cohosh) 12)  Mvi Womens One A Day .Marland Kitchen..  1 By Mouth Once Daily 13)  Macrobid 100 Mg Caps (Nitrofurantoin Monohyd Macro) .Marland Kitchen.. 1 By Mouth Two Times A Day 14)  Vicodin Es 7.5-750 Mg Tabs (Hydrocodone-Acetaminophen) .Marland Kitchen.. 1 By Mouth Q6h As Needed Pain  Allergies (verified): No Known Drug Allergies  Past History:  Past Medical History: Last updated: 09/27/2008 GERD Hyperlipidemia    vitamin D deficiency  TOBACCO USE (ICD-305.1)  MIGRAINE (ICD-346.90) Hypertension Low back pain  Past Surgical History: Last updated: September 08, 2009  Tonsillectomy     Family History: Last updated: 09/08/09 father  died 56 of mi mom cabg  pacer   60s   sis dm  died  sis  died   50yo-- "heart stopped" Family History Diabetes 1st degree relative ? gm breast cancer  MOM arthritis crippling  and gm  Family History of Arthritis Family History of CAD Female 1st degree relative <60   Family History of CAD Female 1st degree relative <50         Social History: Last updated: 09/27/2008 Divorced  Current Smoker 1ppd     Alcohol use-no Drug use-no    Regular exercise-no coffee every day  ..   2 perday hh of  5   children and mom      works 7pm-3am    at post office   sleep irregular.    Risk Factors: Alcohol Use: none (11/12/2009) Caffeine Use: none (11/12/2009) Exercise: no (11/12/2009)  Risk Factors: Smoking Status: current (11/12/2009) Packs/Day: 1 (11/12/2009) Cigars/wk: 1 pack a day (11/12/2009) Passive Smoke Exposure: no (11/12/2009)  Family History: Reviewed history from 08/26/2009 and no changes required. father  died 19 of mi mom cabg  pacer   60s   sis dm  died  sis  died   50yo-- "heart stopped" Family History Diabetes 1st degree relative ? gm breast cancer  MOM arthritis crippling  and gm  Family History of Arthritis Family History of CAD Female 1st degree relative <60   Family History of CAD Female 1st degree relative <50         Social History: Reviewed history from 09/27/2008 and no changes required. Divorced    Current Smoker 1ppd     Alcohol use-no Drug use-no    Regular exercise-no coffee every day  ..   2 perday hh of 5   children and mom      works 7pm-3am    at post office   sleep irregular.    Review of Systems      See HPI  Physical Exam  General:  Well-developed,well-nourished,in no acute distress; alert,appropriate and cooperative throughout examination Abdomen:  Bowel sounds positive,abdomen soft and non-tender without masses, organomegaly or hernias noted. Msk:  normal ROM, no joint swelling, no joint warmth, no redness over joints, and no joint deformities.   Extremities:  No clubbing, cyanosis, edema, or deformity noted with normal full range of motion of all joints.   Neurologic:  alert & oriented X3, strength normal in all extremities, gait normal, and DTRs symmetrical and normal.   Skin:  Intact without suspicious lesions or rashes Cervical Nodes:  No lymphadenopathy noted Psych:  Cognition and judgment appear intact. Alert and cooperative with normal attention span and concentration. No apparent delusions, illusions, hallucinations   Impression & Recommendations:  Problem # 1:  BACK PAIN, LUMBAR, WITH RADICULOPATHY (ICD-724.4)  Her updated medication list for this problem includes:    Ultram 50 Mg Tabs (Tramadol hcl) .Marland Kitchen... 1/2 - 1 by mouth every 6 hours as needed    Flexeril 10 Mg Tabs (Cyclobenzaprine hcl) .Marland Kitchen... 1 by mouth three times a day    Vicodin Es 7.5-750 Mg Tabs (Hydrocodone-acetaminophen) .Marland Kitchen... 1 by mouth q6h as needed pain  Orders: T-Lumbar Spine 2 Views (72100TC)  Discussed use of moist heat or ice, modified activities, medications, and stretching/strengthening exercises. Back care instructions given. To be seen in 2 weeks if no improvement; sooner if worsening of symptoms.   Problem # 2:  DYSURIA (ICD-788.1)  Her updated medication list for this problem includes:    Macrobid 100 Mg Caps (Nitrofurantoin monohyd macro) .Marland Kitchen... 1 by mouth two times a  day  Orders: T-Culture, Urine ([id number])  Encouraged to push clear liquids, get enough rest, and take acetaminophen as needed. To be seen in 10 days if no improvement, sooner if worse.  Complete Medication List: 1)  Zocor 40 Mg Tabs (Simvastatin) .Marland Kitchen.. 1 by mouth at bedtime. 2)  Fish Oil Concentrate 1000 Mg Caps (Omega-3 fatty acids) 3)  Benicar Hct 20-12.5 Mg Tabs (Olmesartan medoxomil-hctz) .... 1/2  tab by mouth once daily 4)  Nexium 40 Mg Cpdr (Esomeprazole magnesium) .Marland Kitchen.. 1 by mouth once daily 5)  Antivert 25 Mg Tabs (  Meclizine hcl) 6)  Ultram 50 Mg Tabs (Tramadol hcl) .... 1/2 - 1 by mouth every 6 hours as needed 7)  Flector 1.3 % Ptch (Diclofenac epolamine) .Marland Kitchen.. 1 patch q day 8)  Flexeril 10 Mg Tabs (Cyclobenzaprine hcl) .Marland Kitchen.. 1 by mouth three times a day 9)  Back Brace Specifically For Lumbar Support  .... Dx  back pain1 10)  Grapeseed Complex  11)  Black Cohosh 40 Mg Caps (Black cohosh) 12)  Mvi Womens One A Day  .Marland KitchenMarland Kitchen. 1 by mouth once daily 13)  Macrobid 100 Mg Caps (Nitrofurantoin monohyd macro) .Marland Kitchen.. 1 by mouth two times a day 14)  Vicodin Es 7.5-750 Mg Tabs (Hydrocodone-acetaminophen) .Marland Kitchen.. 1 by mouth q6h as needed pain Prescriptions: VICODIN ES 7.5-750 MG TABS (HYDROCODONE-ACETAMINOPHEN) 1 by mouth q6h as needed pain  #30 x 0   Entered and Authorized by:   Loreen Freud DO   Signed by:   Loreen Freud DO on 04/23/2010   Method used:   Print then Give to Patient   RxID:   [id number] FLEXERIL 10 MG TABS (CYCLOBENZAPRINE HCL) 1 by mouth three times a day  #30 x 0   Entered and Authorized by:   Loreen Freud DO   Signed by:   Loreen Freud DO on 04/23/2010   Method used:   Electronically to        [facility] (retail)       [address]       [place], [place]  [address]       Ph: [phone]       Fax: [phone]   RxID:   [id number] MACROBID 100 MG CAPS (NITROFURANTOIN MONOHYD MACRO) 1 by mouth two times a day  #14 x 0   Entered and Authorized by:   Loreen Freud  DO   Signed by:   Loreen Freud DO on 04/23/2010   Method used:   Electronically to        [facility] (retail)       [address]       [place], [place]  [address]       Ph: [phone]       Fax: [phone]   RxID:   [id number]    Orders Added: 1)  T-Culture, Urine [[id number]] 2)  T-Lumbar Spine 2 Views [72100TC] 3)  Est. Patient Level III [[address]2376]    Laboratory Results   Urine Tests    Routine Urinalysis   Color: straw Appearance: Cloudy Glucose: negative   (Normal Range: Negative) Bilirubin: moderate   (Normal Range: Negative) Ketone: trace (5)   (Normal Range: Negative) Spec. Gravity: 1.015   (Normal Range: 1.003-1.035) Blood: trace-lysed   (Normal Range: Negative) pH: 6.0   (Normal Range: 5.0-8.0) Protein: 30   (Normal Range: Negative) Urobilinogen: 0.2   (Normal Range: 0-1) Nitrite: negative   (Normal Range: Negative) Leukocyte Esterace: moderate   (Normal Range: Negative)

## 2010-05-08 NOTE — Letter (Signed)
Summary: Out of Work  [facility]  [address] [place], [place] [address]   Phone: [phone]  Fax: [phone]    April 28, 2010   Employee:  Earnie Larsson    To Whom It May Concern:   For Medical reasons, please excuse the above named employee from work for the following dates:  Start:  April 28, 2010  End:  April 28, 2010  If you need additional information, please feel free to contact our office.         Sincerely,        Loreen Freud, DO

## 2010-05-08 NOTE — Letter (Signed)
Summary: Out of Work  [facility]  [address] [place], [place] [address]   Phone: [phone]  Fax: [phone]    April 23, 2010   Employee:  Ebony Curtis    To Whom It May Concern:   For Medical reasons, please excuse the above named employee from work for the following dates:  Start:   04/22/18/2012  End:   04/25/2010  If you need additional information, please feel free to contact our office.         Sincerely,    Loreen Freud DO

## 2010-05-22 NOTE — Consult Note (Signed)
Summary: [facility]   Imported By: Sherian Rein 05/13/2010 15:14:50  _____________________________________________________________________  External Attachment:    Type:   Image     Comment:   External Document

## 2010-06-25 ENCOUNTER — Other Ambulatory Visit: Payer: Self-pay | Admitting: Family Medicine

## 2010-06-25 NOTE — Telephone Encounter (Signed)
Patient is due for Labs.

## 2010-06-30 ENCOUNTER — Encounter: Payer: Self-pay | Admitting: Family Medicine

## 2010-06-30 ENCOUNTER — Ambulatory Visit (INDEPENDENT_AMBULATORY_CARE_PROVIDER_SITE_OTHER): Payer: Federal, State, Local not specified - PPO | Admitting: Family Medicine

## 2010-06-30 DIAGNOSIS — R300 Dysuria: Secondary | ICD-10-CM

## 2010-06-30 DIAGNOSIS — E559 Vitamin D deficiency, unspecified: Secondary | ICD-10-CM

## 2010-06-30 DIAGNOSIS — N393 Stress incontinence (female) (male): Secondary | ICD-10-CM

## 2010-06-30 DIAGNOSIS — E785 Hyperlipidemia, unspecified: Secondary | ICD-10-CM

## 2010-06-30 DIAGNOSIS — IMO0002 Reserved for concepts with insufficient information to code with codable children: Secondary | ICD-10-CM

## 2010-06-30 DIAGNOSIS — I10 Essential (primary) hypertension: Secondary | ICD-10-CM

## 2010-06-30 DIAGNOSIS — K219 Gastro-esophageal reflux disease without esophagitis: Secondary | ICD-10-CM

## 2010-06-30 LAB — POCT URINALYSIS DIPSTICK
Bilirubin, UA: NEGATIVE
Glucose, UA: NEGATIVE
Nitrite, UA: NEGATIVE
Urobilinogen, UA: NEGATIVE
pH, UA: 5

## 2010-06-30 LAB — HEPATIC FUNCTION PANEL
ALT: 14 U/L (ref 0–35)
AST: 17 U/L (ref 0–37)
Total Protein: 7 g/dL (ref 6.0–8.3)

## 2010-06-30 LAB — BASIC METABOLIC PANEL
Calcium: 9.2 mg/dL (ref 8.4–10.5)
GFR: 128.51 mL/min (ref >60.00)
Potassium: 4.5 mEq/L (ref 3.5–5.1)
Sodium: 140 mEq/L (ref 135–145)

## 2010-06-30 LAB — LIPID PANEL
Cholesterol: 212 mg/dL — ABNORMAL HIGH (ref 0–200)
HDL: 47.2 mg/dL (ref >39.00)
Triglycerides: 193 mg/dL — ABNORMAL HIGH (ref 0.0–149.0)

## 2010-06-30 MED ORDER — ESOMEPRAZOLE MAGNESIUM 40 MG PO CPDR: 40.0000 mg | DELAYED_RELEASE_CAPSULE | Freq: Every day | ORAL | Status: DC

## 2010-06-30 NOTE — Assessment & Plan Note (Signed)
Cont meds Check labs 

## 2010-06-30 NOTE — Progress Notes (Signed)
  Subjective:    Patient ID: Ebony Curtis, female    DOB: [birth year], 50 y.o.   MRN: [id number]  Hypertension This is a chronic problem. The current episode started more than 1 year ago. The problem has been resolved since onset. The problem is controlled. Pertinent negatives include no anxiety, blurred vision, chest pain, headaches, malaise/fatigue, neck pain, orthopnea, palpitations, peripheral edema, PND, shortness of breath or sweats. There are no associated agents to hypertension. Risk factors for coronary artery disease include dyslipidemia, family history, obesity, post-menopausal state and sedentary lifestyle. Past treatments include nothing. The current treatment provides significant improvement. There is no history of angina, kidney disease, CVA, heart failure, left ventricular hypertrophy, PVD, renovascular disease or retinopathy. There is no history of chronic renal disease, coarctation of the aorta, hyperaldosteronism, hypercortisolism, hyperparathyroidism, a hypertension causing med, pheochromocytoma or sleep apnea.  Hyperlipidemia She has no history of chronic renal disease. Pertinent negatives include no chest pain or shortness of breath.  Pt is also c/o incontinence since last child but has gotten worse.  She is doing well with her back---she is in PT and will see Neurosurgery today for f/u.   Pt is also c/o hives but is not taking any new meds and is off all pain meds.  Benadryl helps.      Review of Systems  Constitutional: Negative for malaise/fatigue.  HENT: Negative for neck pain.   Eyes: Negative for blurred vision.  Respiratory: Negative for shortness of breath.   Cardiovascular: Negative for chest pain, palpitations, orthopnea and PND.  Skin: Positive for rash.  Neurological: Negative for headaches.       Objective:   Physical Exam  Constitutional: She is oriented to person, place, and time. She appears well-developed and well-nourished. No distress.  Neck:  Normal range of motion. Neck supple.  Cardiovascular: Normal rate, regular rhythm and normal heart sounds.   Pulmonary/Chest: Effort normal and breath sounds normal. No respiratory distress. She has no wheezes.  Musculoskeletal: She exhibits no edema.  Neurological: She is alert and oriented to person, place, and time.  Psychiatric: She has a normal mood and affect. Her behavior is normal.          Assessment & Plan:

## 2010-06-30 NOTE — Assessment & Plan Note (Signed)
Check urine.

## 2010-06-30 NOTE — Assessment & Plan Note (Signed)
Per neurosurgery  

## 2010-06-30 NOTE — Assessment & Plan Note (Signed)
Take nexium daily GERD diet  Pt expressed understanding and is in agreement with plan.

## 2010-06-30 NOTE — Assessment & Plan Note (Signed)
Check urine culture  

## 2010-06-30 NOTE — Assessment & Plan Note (Signed)
Off meds  con't to watch

## 2010-07-02 ENCOUNTER — Telehealth: Payer: Self-pay | Admitting: *Deleted

## 2010-07-02 LAB — URINE CULTURE

## 2010-07-02 NOTE — Telephone Encounter (Signed)
Pt left VM that she lost Rx for BACK BRACE SPECIFICALLY FOR LUMBAR SUPPORT and she does not remember when she needed to take Rx to. Pt advise Rx ready for pick up and Rx can be taking to any medical supply store to be filled.Felecia Trinitey Roache CMA

## 2010-07-03 ENCOUNTER — Other Ambulatory Visit (INDEPENDENT_AMBULATORY_CARE_PROVIDER_SITE_OTHER): Payer: Federal, State, Local not specified - PPO

## 2010-07-03 DIAGNOSIS — N390 Urinary tract infection, site not specified: Secondary | ICD-10-CM

## 2010-07-03 LAB — VITAMIN D 1,25 DIHYDROXY: Vitamin D 1, 25 (OH)2 Total: 89 pg/mL — ABNORMAL HIGH (ref 18–72)

## 2010-07-03 LAB — POCT URINALYSIS DIPSTICK
Bilirubin, UA: NEGATIVE
Ketones, UA: NEGATIVE
Leukocytes, UA: NEGATIVE
Protein, UA: NEGATIVE
Spec Grav, UA: 1.015

## 2010-07-03 MED ORDER — ROSUVASTATIN CALCIUM 10 MG PO TABS: 10.0000 mg | ORAL_TABLET | Freq: Every day | ORAL | Status: DC

## 2010-07-03 NOTE — Progress Notes (Signed)
Pt aware of results.... Scheduled today for a repeat UA with culture      KP

## 2010-07-03 NOTE — Progress Notes (Signed)
Patient is aware of the results. Rx crestor 10 mg was sent to [facility] and pt coming in today to repeat her UA and Culture...Marland KitchenMarland KitchenMarland Kitchen KP

## 2010-07-03 NOTE — Progress Notes (Signed)
Addended by: Almeta Monas on: 07/03/2010 08:12 AM   Modules accepted: Orders

## 2010-07-07 LAB — URINE CULTURE: Colony Count: >=100000

## 2010-07-09 ENCOUNTER — Telehealth: Payer: Self-pay | Admitting: *Deleted

## 2010-07-09 MED ORDER — CIPROFLOXACIN HCL 500 MG PO TABS: 500.0000 mg | ORAL_TABLET | Freq: Two times a day (BID) | ORAL | Status: AC

## 2010-07-09 NOTE — Telephone Encounter (Signed)
Pt aware Rx sent to pharmacy 

## 2010-07-09 NOTE — Telephone Encounter (Signed)
Message copied by Candie Echevaria on Wed Jul 09, 2010  4:15 PM ------      Message from: Loreen Freud      Created: Mon Jul 07, 2010  8:31 AM       + UTI---- cipro 500 mg 1 po bid for 5 days

## 2010-07-09 NOTE — Progress Notes (Signed)
Left message to call back     KP 

## 2010-07-12 LAB — URINALYSIS, ROUTINE W REFLEX MICROSCOPIC
Glucose, UA: NEGATIVE mg/dL
Protein, ur: NEGATIVE mg/dL
Specific Gravity, Urine: 1.029 (ref 1.005–1.030)
Urobilinogen, UA: 0.2 mg/dL (ref 0.0–1.0)

## 2010-07-12 LAB — CBC
HCT: 41.6 % (ref 36.0–46.0)
MCV: 93.4 fL (ref 78.0–100.0)
Platelets: 280 10*3/uL (ref 150–400)
RDW: 13.1 % (ref 11.5–15.5)

## 2010-07-12 LAB — DIFFERENTIAL
Basophils Absolute: 0.1 10*3/uL (ref 0.0–0.1)
Lymphocytes Relative: 54 % — ABNORMAL HIGH (ref 12–46)
Lymphs Abs: 4.2 10*3/uL — ABNORMAL HIGH (ref 0.7–4.0)
Monocytes Absolute: 0.5 10*3/uL (ref 0.1–1.0)
Monocytes Relative: 7 % (ref 3–12)
Neutro Abs: 2.8 10*3/uL (ref 1.7–7.7)

## 2010-07-12 LAB — COMPREHENSIVE METABOLIC PANEL
Albumin: 3.9 g/dL (ref 3.5–5.2)
BUN: 15 mg/dL (ref 6–23)
Calcium: 9.5 mg/dL (ref 8.4–10.5)
Creatinine, Ser: 0.9 mg/dL (ref 0.4–1.2)
Total Protein: 7.6 g/dL (ref 6.0–8.3)

## 2010-07-12 LAB — URINE MICROSCOPIC-ADD ON

## 2010-08-16 ENCOUNTER — Encounter: Payer: Self-pay | Admitting: Family Medicine

## 2010-08-18 ENCOUNTER — Encounter: Payer: Self-pay | Admitting: Family Medicine

## 2010-08-18 ENCOUNTER — Ambulatory Visit (INDEPENDENT_AMBULATORY_CARE_PROVIDER_SITE_OTHER): Payer: Federal, State, Local not specified - PPO | Admitting: Family Medicine

## 2010-08-18 DIAGNOSIS — M549 Dorsalgia, unspecified: Secondary | ICD-10-CM

## 2010-08-18 NOTE — Progress Notes (Signed)
  Subjective:    Ebony Curtis is a 50 y.o. female who presents for follow up of low back problems. Current symptoms include: pain in mid low back (burning and tight band in character; 7/10 in severity). Symptoms have not changed from the previous visit. Exacerbating factors identified by the patient are sitting , standing and walking.   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  Pt has seen Dr Jeral Fruit who feels she may need disability because everytime she goes back to work it aggravates the pain.   The following portions of the patient's history were reviewed and updated as appropriate: allergies, current medications, past family history, past medical history, past social history, past surgical history and problem list.    Objective:    BP 124/72  Pulse 82  Temp(Src) 99 F (37.2 C) (Oral)  Resp 16  Wt 219 lb 12.8 oz (99.701 kg)  SpO2 95% General appearance: alert, cooperative, appears stated age and no distress Back: Pain Low back,  with radiation L thigh Lungs: clear to auscultation bilaterally Heart: regular rate and rhythm, S1, S2 normal, no murmur, click, rub or gallop Extremities: extremities normal, atraumatic, no cyanosis or edema Neurologic: AAOx3  good strength all 4 ext,  DTR  = and B/L .  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 Questions answered. Stretching exercises discussed. Ice to affected area as needed for local pain relief. Heat to affected area as needed for local pain relief. PT referral. Follow-up in 1 month or sooner prn.

## 2010-08-27 ENCOUNTER — Telehealth: Payer: Self-pay

## 2010-08-27 NOTE — Telephone Encounter (Signed)
Spoke w/ pt says that she picked up form which was submitted to credit union and also needs letter to be written with information that she had type in letter placed on Dr. Ernst Spell ledge.

## 2010-08-27 NOTE — Telephone Encounter (Signed)
Note done

## 2010-08-27 NOTE — Telephone Encounter (Signed)
Note placed up front for pick-up.

## 2010-09-18 ENCOUNTER — Encounter: Payer: Self-pay | Admitting: Family Medicine

## 2010-09-18 ENCOUNTER — Ambulatory Visit (INDEPENDENT_AMBULATORY_CARE_PROVIDER_SITE_OTHER): Payer: Federal, State, Local not specified - PPO | Admitting: Family Medicine

## 2010-09-18 VITALS — BP 122/80 | HR 86 | Temp 97.3°F | Wt 216.0 lb

## 2010-09-18 DIAGNOSIS — IMO0002 Reserved for concepts with insufficient information to code with codable children: Secondary | ICD-10-CM

## 2010-09-18 DIAGNOSIS — M549 Dorsalgia, unspecified: Secondary | ICD-10-CM

## 2010-09-18 DIAGNOSIS — E785 Hyperlipidemia, unspecified: Secondary | ICD-10-CM

## 2010-09-18 DIAGNOSIS — I10 Essential (primary) hypertension: Secondary | ICD-10-CM

## 2010-09-18 LAB — LIPID PANEL
Total CHOL/HDL Ratio: 3
Triglycerides: 106 mg/dL (ref 0.0–149.0)

## 2010-09-18 LAB — BASIC METABOLIC PANEL
BUN: 12 mg/dL (ref 6–23)
CO2: 28 mEq/L (ref 19–32)
Chloride: 106 mEq/L (ref 96–112)
Creatinine, Ser: 0.8 mg/dL (ref 0.4–1.2)

## 2010-09-18 LAB — HEPATIC FUNCTION PANEL
ALT: 18 U/L (ref 0–35)
Bilirubin, Direct: 0.1 mg/dL (ref 0.0–0.3)
Total Protein: 7.8 g/dL (ref 6.0–8.3)

## 2010-09-18 NOTE — Progress Notes (Signed)
  Subjective:    Patient ID: Ebony Curtis, female    DOB: 1960-12-23, 50 y.o.   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  Pt is still waiting to have PT approved.   Pt still c/o  L hip pain and radiates down R thigh.  No other complaints   Review of Systems    as above Objective:   Physical Exam  Constitutional: She is oriented to person, place, and time. She appears well-developed and well-nourished.  Musculoskeletal: She exhibits no edema.  Neurological: She is alert and oriented to person, place, and time. She has normal reflexes.       Pain R hip --radiates down thigh          Assessment & Plan:

## 2010-09-18 NOTE — Assessment & Plan Note (Signed)
[place] de[place] labor papers filled out We will try to get more PT Pt may need long term disability

## 2010-09-25 ENCOUNTER — Encounter: Payer: Self-pay | Admitting: *Deleted

## 2010-10-20 ENCOUNTER — Ambulatory Visit (INDEPENDENT_AMBULATORY_CARE_PROVIDER_SITE_OTHER): Payer: Federal, State, Local not specified - PPO | Admitting: Family Medicine

## 2010-10-20 ENCOUNTER — Encounter: Payer: Self-pay | Admitting: Family Medicine

## 2010-10-20 VITALS — BP 130/78 | HR 77 | Temp 97.0°F | Ht 63.0 in | Wt 225.6 lb

## 2010-10-20 DIAGNOSIS — IMO0002 Reserved for concepts with insufficient information to code with codable children: Secondary | ICD-10-CM

## 2010-10-20 NOTE — Assessment & Plan Note (Signed)
Pt found to have bulging disc and degeneration---see MRI report  Pt was told by neurosurgery that nothing else could be done and she needed disability Pt is still on diazepam for spasms

## 2010-10-20 NOTE — Progress Notes (Signed)
  Subjective:    Patient ID: Ebony Curtis, female    DOB: 08-05-1960, 50 y.o.   MRN: [id number]  HPI Pt is here to discuss her appeal for disablility.  The original injury was June 2010 when OTR was falling toward her and she tried to stop it but could not and that was when back spasms started.  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  They worsened and pt came in to my office 04/23/2010.  Pt c/o Low back pain with radiation down Left leg.  No neurological symptoms at that time.  xrays were done and MRI on 04/30/2010.  Degenerative changes were found on xray only but pt called stating symptoms were worse and she had trouble walking.  MRI should disc protrusion and stenosis.  Pt was referred to Neurosurgeon who felt at that time she may need surgery. She saw Dr Botero--07/2010 and at that time he told pt there was nothing else to do and she would need disability.  She was released to come back here. Pt also had PT between 1st and 2nd visits.  She returned back to work and had a lot of difficulty standing and sitting for long periods--she had a lot of pain in back and leg. Review of Systems As above---pt still has trouble with standing and sitting for long periods and is unable to operate machinery.    Objective:   Physical Exam  Constitutional: She is oriented to person, place, and time.  Neurological: She is alert and oriented to person, place, and time. She has normal reflexes. Coordination and gait normal.       Pt has 4/5 weakiness L foot  Otherwise strength normal Pt c/o pain in l leg and low back during exam  Psychiatric: She has a normal mood and affect. Her behavior is normal. Judgment and thought content normal.          Assessment & Plan:

## 2010-12-04 ENCOUNTER — Telehealth: Payer: Self-pay | Admitting: *Deleted

## 2010-12-04 NOTE — Telephone Encounter (Signed)
Ok to give a note stating that is out of work until further notice.  She is curently in PT and it is unknown when she can return or if she will be able to return at all.   Also ,  If PT not helping she will need to f/u Dr Jeral Fruit.

## 2010-12-04 NOTE — Telephone Encounter (Signed)
Pt states that she needs note stating that she is out of work from July 14 until indefinite. Pt note that this is due to lower back injury. Pt also notes that she has a pending disability claim filed. Notes needs to be faxed to [phone] Attention: Sloan Leiter

## 2010-12-04 NOTE — Telephone Encounter (Signed)
Patient  Stated she does not go to PT anymore. She stated she came in to fill out paperwork for disability     KP

## 2010-12-05 ENCOUNTER — Other Ambulatory Visit: Payer: Self-pay | Admitting: Family Medicine

## 2011-02-03 ENCOUNTER — Ambulatory Visit: Payer: Federal, State, Local not specified - PPO | Admitting: Family Medicine

## 2011-02-04 ENCOUNTER — Encounter: Payer: Self-pay | Admitting: Family Medicine

## 2011-02-06 ENCOUNTER — Encounter: Payer: Self-pay | Admitting: Family Medicine

## 2011-02-06 ENCOUNTER — Ambulatory Visit (INDEPENDENT_AMBULATORY_CARE_PROVIDER_SITE_OTHER): Payer: Federal, State, Local not specified - PPO | Admitting: Family Medicine

## 2011-02-06 VITALS — BP 134/80 | HR 80 | Temp 98.3°F | Wt 227.8 lb

## 2011-02-06 DIAGNOSIS — M25519 Pain in unspecified shoulder: Secondary | ICD-10-CM

## 2011-02-06 DIAGNOSIS — E785 Hyperlipidemia, unspecified: Secondary | ICD-10-CM

## 2011-02-06 DIAGNOSIS — M25512 Pain in left shoulder: Secondary | ICD-10-CM

## 2011-02-06 DIAGNOSIS — R52 Pain, unspecified: Secondary | ICD-10-CM

## 2011-02-06 MED ORDER — DIAZEPAM 5 MG PO TABS: 5.0000 mg | ORAL_TABLET | Freq: Four times a day (QID) | ORAL | Status: DC | PRN

## 2011-02-06 NOTE — Progress Notes (Signed)
Subjective:    Ebony Curtis is a 50 y.o. female here for follow up of dyslipidemia. The patient does not use medications that may worsen dyslipidemias (corticosteroids, progestins, anabolic steroids, diuretics, beta-blockers, amiodarone, cyclosporine, olanzapine). The patient exercises never. The patient is not known to have coexisting coronary artery disease.   Pt is still also in middle of trying to get disability for low back pain.   No change with pain.  Pt really needs Pt but disability is refusing to pay.   Pt also c/o L shoulder pain---probably from yard work but not sure.  No fall.  Cardiac Risk Factors Age > 45-female, > 55-female:  NO  Smoking:   YES  +1  Sig. family hx of CHD*:  YES  +1  Hypertension:   NO  Diabetes:   NO  HDL < 35:   NO  HDL > 59:   NO  Total: 2   *- Sig. family h/o CHD per NCEP = MI or sudden death at <55yo in  father or other 1st-degree female relative, or <65yo in mother or  other 1st-degree female relative  The following portions of the patient's history were reviewed and updated as appropriate: allergies, current medications, past family history, past medical history, past social history, past surgical history and problem list.  Review of Systems Pertinent items are noted in HPI.    Objective:    BP 134/80  Pulse 80  Temp(Src) 98.3 F (36.8 C) (Oral)  Wt 227 lb 12.8 oz (103.329 kg)  SpO2 94% General appearance: alert, cooperative, appears stated age and no distress Head: Normocephalic, without obvious abnormality, atraumatic Lungs: clear to auscultation bilaterally Heart: regular rate and rhythm, S1, S2 normal, no murmur, click, rub or gallop Extremities: extremities normal, atraumatic, no cyanosis or edema---  L arm--+ ROM---but pain with ext and int rot shoulder Lab Review Lab Results  Component Value Date   CHOL 173 09/18/2010   CHOL 212* 06/30/2010   CHOL 160 11/25/2009   HDL 49.60 09/18/2010   HDL 16.10 06/30/2010   HDL 96.04  11/25/2009   LDLDIRECT 149.4 06/30/2010      Assessment:  1  Dyslipidemia as detailed above with 2 CHD risk factors using NCEP scheme above.  Target levels for LDL are: < 100 mg/dl (CHD or "CHD risk equivalent" is present)  Explained to the patient the respective contributions of genetics, diet, and exercise to lipid levels and the use of medication in severe cases which do not respond to lifestyle alteration. The patient's interest and motivation in making lifestyle changes seems good.   2 low back pain---  Pt trying to get disability,  Still with back pain 3.  L shoulder injury---  Not work related----will refer to pt for eval Plan:    The following changes are planned for the next 6 months, at which time the patient will return for repeat fasting lipids:  1. Dietary changes: Increase soluble fiber Plant sterols 2grams per day (e.g. Benecol): yes Reduce saturated fat, "trans" monounsaturated fatty acids, and cholesterol stop smoking 2. Exercise changes:  3x a week---start with walking 3. Other treatment: Smoking cessation (Pt states she is not ready yet) Weight reduction (diet and exercise discussed with pt) 4. Lipid-lowering medications: crestor  (Recommended by NCEP after 3-6 mos of dietary therapy & lifestyle modification,  except if CHD is present or LDL well above 190.) 5. Hormone replacement therapy (patient is a postmenopausal  woman): no 6. Screening for secondary causes of dyslipidemias: None indicated  7. Lipid screening for relatives: na 8. Follow up: 6 months.  Note: The majority of the visit was spent in counseling on the pathophysiology and treatment of dyslipidemias. The total face-to-face time was in excess of 25 minutes.

## 2011-02-06 NOTE — Patient Instructions (Signed)

## 2011-03-09 ENCOUNTER — Other Ambulatory Visit (INDEPENDENT_AMBULATORY_CARE_PROVIDER_SITE_OTHER): Payer: Federal, State, Local not specified - PPO

## 2011-03-09 DIAGNOSIS — E785 Hyperlipidemia, unspecified: Secondary | ICD-10-CM

## 2011-03-09 LAB — BASIC METABOLIC PANEL
BUN: 11 mg/dL (ref 6–23)
CO2: 26 mEq/L (ref 19–32)
Calcium: 8.8 mg/dL (ref 8.4–10.5)
Creatinine, Ser: 0.8 mg/dL (ref 0.4–1.2)
Glucose, Bld: 100 mg/dL — ABNORMAL HIGH (ref 70–99)

## 2011-03-09 LAB — HEPATIC FUNCTION PANEL
AST: 17 U/L (ref 0–37)
Albumin: 3.9 g/dL (ref 3.5–5.2)
Alkaline Phosphatase: 62 U/L (ref 39–117)

## 2011-03-09 LAB — LIPID PANEL: Total CHOL/HDL Ratio: 3

## 2011-03-09 NOTE — Progress Notes (Signed)
12  

## 2011-05-27 ENCOUNTER — Encounter: Payer: Self-pay | Admitting: Family Medicine

## 2011-05-27 ENCOUNTER — Ambulatory Visit (INDEPENDENT_AMBULATORY_CARE_PROVIDER_SITE_OTHER): Payer: Federal, State, Local not specified - PPO | Admitting: Family Medicine

## 2011-05-27 ENCOUNTER — Telehealth: Payer: Self-pay

## 2011-05-27 VITALS — BP 126/84 | HR 76 | Temp 98.5°F | Wt 229.4 lb

## 2011-05-27 DIAGNOSIS — IMO0002 Reserved for concepts with insufficient information to code with codable children: Secondary | ICD-10-CM

## 2011-05-27 DIAGNOSIS — M549 Dorsalgia, unspecified: Secondary | ICD-10-CM

## 2011-05-27 MED ORDER — SIMVASTATIN 40 MG PO TABS: 40.0000 mg | ORAL_TABLET | Freq: Every day | ORAL | Status: DC

## 2011-05-27 NOTE — Progress Notes (Signed)
  Subjective:    Ebony Curtis is a 51 y.o. female who presents for follow up of low back problems. Current symptoms include: numbness in L leg and pain in low back (aching and burning in character; 5/10 in severity). Symptoms have worsened from the previous visit. Exacerbating factors identified by the patient are bending forwards, sitting, standing and walking.  The following portions of the patient's history were reviewed and updated as appropriate: allergies, current medications, past family history, past medical history, past social history, past surgical history and problem list.    Objective:    BP 126/84  Pulse 76  Temp(Src) 98.5 F (36.9 C) (Oral)  Wt 229 lb 6.4 oz (104.055 kg)  SpO2 95%  General Appearance:    Alert, cooperative, no distress, appears stated age  Head:    Normocephalic, without obvious abnormality, atraumatic  Eyes:  na  Ears:  NA  Nose: na  Throat: na  Neck: na  Back:     Symmetric, no curvature,l, no CVA tenderness  Lungs:     na  Chest Wall:   na   Heart:    na  Breast Exam:    na  Abdomen:     na  Genitalia:   na  Rectal:  na  Extremities:   Extremities normal, atraumatic, no cyanosis or edema  Pulses:   2+ and symmetric all extremities  Skin:   na  Lymph nodes:   Cervical, supraclavicular, and axillary nodes normal  Neurologic:   CNII-XII intact, , sensation and reflexes    Throughout-- weakness L hip flexion and L toe plantar and dorsi flexion     Assessment:  Low back pain--- seems to be be slightly worse     Plan:    Agricultural engineer distributed. pt needs to go back to [facility] for possible injection per last NS note

## 2011-05-27 NOTE — Telephone Encounter (Signed)
zocor was probably changed because it was not strong enough---we can try it for 3 months---zocor 40 mg #30  1 po qpm  2 refils---- recheck 3 months

## 2011-05-27 NOTE — Assessment & Plan Note (Signed)
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

## 2011-05-27 NOTE — Telephone Encounter (Signed)
msg from patient and she stated that the Crestor is causing her to have headaches and she would like to switch back to simvastatin. Please advise   KP

## 2011-06-03 ENCOUNTER — Other Ambulatory Visit: Payer: Self-pay | Admitting: Family Medicine

## 2011-06-03 ENCOUNTER — Telehealth: Payer: Self-pay | Admitting: Family Medicine

## 2011-06-03 DIAGNOSIS — M542 Cervicalgia: Secondary | ICD-10-CM

## 2011-06-03 DIAGNOSIS — M549 Dorsalgia, unspecified: Secondary | ICD-10-CM

## 2011-06-03 NOTE — Telephone Encounter (Signed)
msg left to call the office     KP 

## 2011-06-03 NOTE — Telephone Encounter (Signed)
Referral to pain management put in. Please inform pt.

## 2011-06-03 NOTE — Telephone Encounter (Signed)
Dr Cassandria Santee office called to say after reviewing the file Dr Jeral Fruit does not need to see the patient.  He suggests we send her to pain management

## 2011-06-04 NOTE — Telephone Encounter (Signed)
Discuss with patient  

## 2011-06-04 NOTE — Telephone Encounter (Signed)
Office Message [address] [place], [place] [address] p. [phone] f. [phone] To: [facility] (Daytime Triage) Fax: [phone] From: [facility] Date/ Time: 06/04/2011 10:12 AM Taken By: Di Kindle, RN Caller: Kelsa Facility: not collected Patient: Ebony, Curtis DOB: Jun 11, 1960 Phone: [phone] Reason for Call: Pt returning call to [facility] in the office, please call [phone].

## 2011-06-05 ENCOUNTER — Encounter: Payer: Self-pay | Admitting: Physical Medicine & Rehabilitation

## 2011-07-14 ENCOUNTER — Ambulatory Visit: Payer: Federal, State, Local not specified - PPO | Admitting: Physical Medicine & Rehabilitation

## 2011-08-07 ENCOUNTER — Ambulatory Visit (INDEPENDENT_AMBULATORY_CARE_PROVIDER_SITE_OTHER): Payer: Federal, State, Local not specified - PPO | Admitting: Family Medicine

## 2011-08-07 ENCOUNTER — Encounter: Payer: Self-pay | Admitting: Family Medicine

## 2011-08-07 ENCOUNTER — Other Ambulatory Visit (HOSPITAL_COMMUNITY)
Admission: RE | Admit: 2011-08-07 | Discharge: 2011-08-07 | Disposition: A | Discharge status: Home / Self Care | Payer: Federal, State, Local not specified - PPO | Source: Ambulatory Visit | Attending: Family Medicine | Admitting: Family Medicine

## 2011-08-07 VITALS — BP 142/70 | HR 82 | Temp 98.3°F | Ht 62.5 in | Wt 229.4 lb

## 2011-08-07 DIAGNOSIS — R32 Unspecified urinary incontinence: Secondary | ICD-10-CM

## 2011-08-07 DIAGNOSIS — Z01419 Encounter for gynecological examination (general) (routine) without abnormal findings: Secondary | ICD-10-CM | POA: Insufficient documentation

## 2011-08-07 DIAGNOSIS — Z1239 Encounter for other screening for malignant neoplasm of breast: Secondary | ICD-10-CM

## 2011-08-07 DIAGNOSIS — Z0000 Encounter for general adult medical examination without abnormal findings: Secondary | ICD-10-CM

## 2011-08-07 DIAGNOSIS — E785 Hyperlipidemia, unspecified: Secondary | ICD-10-CM

## 2011-08-07 DIAGNOSIS — N393 Stress incontinence (female) (male): Secondary | ICD-10-CM

## 2011-08-07 DIAGNOSIS — F17200 Nicotine dependence, unspecified, uncomplicated: Secondary | ICD-10-CM

## 2011-08-07 DIAGNOSIS — S335XXA Sprain of ligaments of lumbar spine, initial encounter: Secondary | ICD-10-CM

## 2011-08-07 DIAGNOSIS — I10 Essential (primary) hypertension: Secondary | ICD-10-CM

## 2011-08-07 DIAGNOSIS — IMO0002 Reserved for concepts with insufficient information to code with codable children: Secondary | ICD-10-CM

## 2011-08-07 LAB — HEPATIC FUNCTION PANEL
ALT: 19 U/L (ref 0–35)
Albumin: 3.8 g/dL (ref 3.5–5.2)
Alkaline Phosphatase: 60 U/L (ref 39–117)
Total Protein: 7.5 g/dL (ref 6.0–8.3)

## 2011-08-07 LAB — POCT URINALYSIS DIPSTICK
Bilirubin, UA: NEGATIVE
Glucose, UA: NEGATIVE
Ketones, UA: NEGATIVE
Leukocytes, UA: NEGATIVE
Nitrite, UA: NEGATIVE
pH, UA: 5

## 2011-08-07 LAB — LIPID PANEL
HDL: 54.2 mg/dL (ref >39.00)
Triglycerides: 80 mg/dL (ref 0.0–149.0)

## 2011-08-07 LAB — CBC WITH DIFFERENTIAL/PLATELET
Basophils Relative: 0.8 % (ref 0.0–3.0)
Eosinophils Absolute: 0.1 10*3/uL (ref 0.0–0.7)
Eosinophils Relative: 1.2 % (ref 0.0–5.0)
Hemoglobin: 14.1 g/dL (ref 12.0–15.0)
MCHC: 33.3 g/dL (ref 30.0–36.0)
MCV: 93 fl (ref 78.0–100.0)
Monocytes Absolute: 0.5 10*3/uL (ref 0.1–1.0)
Neutro Abs: 4.4 10*3/uL (ref 1.4–7.7)
RBC: 4.55 Mil/uL (ref 3.87–5.11)

## 2011-08-07 LAB — BASIC METABOLIC PANEL
CO2: 26 mEq/L (ref 19–32)
Chloride: 105 mEq/L (ref 96–112)
Potassium: 4.1 mEq/L (ref 3.5–5.1)
Sodium: 140 mEq/L (ref 135–145)

## 2011-08-07 MED ORDER — SIMVASTATIN 40 MG PO TABS: 40.0000 mg | ORAL_TABLET | Freq: Every day | ORAL | Status: DC

## 2011-08-07 MED ORDER — SOLIFENACIN SUCCINATE 10 MG PO TABS: 5.0000 mg | ORAL_TABLET | Freq: Every day | ORAL | Status: DC

## 2011-08-07 NOTE — Assessment & Plan Note (Signed)
Con' meds Check labs 

## 2011-08-07 NOTE — Assessment & Plan Note (Signed)
Nicorette gum sample given to patient with coupon

## 2011-08-07 NOTE — Assessment & Plan Note (Signed)
Pt will be going to PT Pt disability was accepted

## 2011-08-07 NOTE — Assessment & Plan Note (Signed)
con't meds stable 

## 2011-08-07 NOTE — Patient Instructions (Signed)
Preventive Care for Adults, Female A healthy lifestyle and preventive care can promote health and wellness. Preventive health guidelines for women include the following key practices.  A routine yearly physical is a good way to check with your caregiver about your health and preventive screening. It is a chance to share any concerns and updates on your health, and to receive a thorough exam.   Visit your dentist for a routine exam and preventive care every 6 months. Brush your teeth twice a day and floss once a day. Good oral hygiene prevents tooth decay and gum disease.   The frequency of eye exams is based on your age, health, family medical history, use of contact lenses, and other factors. Follow your caregiver's recommendations for frequency of eye exams.   Eat a healthy diet. Foods like vegetables, fruits, whole grains, low-fat dairy products, and lean protein foods contain the nutrients you need without too many calories. Decrease your intake of foods high in solid fats, added sugars, and salt. Eat the right amount of calories for you.Get information about a proper diet from your caregiver, if necessary.   Regular physical exercise is one of the most important things you can do for your health. Most adults should get at least 150 minutes of moderate-intensity exercise (any activity that increases your heart rate and causes you to sweat) each week. In addition, most adults need muscle-strengthening exercises on 2 or more days a week.   Maintain a healthy weight. The body mass index (BMI) is a screening tool to identify possible weight problems. It provides an estimate of body fat based on height and weight. Your caregiver can help determine your BMI, and can help you achieve or maintain a healthy weight.For adults 20 years and older:   A BMI below 18.5 is considered underweight.   A BMI of 18.5 to 24.9 is normal.   A BMI of 25 to 29.9 is considered overweight.   A BMI of 30 and above is  considered obese.   Maintain normal blood lipids and cholesterol levels by exercising and minimizing your intake of saturated fat. Eat a balanced diet with plenty of fruit and vegetables. Blood tests for lipids and cholesterol should begin at age 20 and be repeated every 5 years. If your lipid or cholesterol levels are high, you are over 50, or you are at high risk for heart disease, you may need your cholesterol levels checked more frequently.Ongoing high lipid and cholesterol levels should be treated with medicines if diet and exercise are not effective.   If you smoke, find out from your caregiver how to quit. If you do not use tobacco, do not start.   If you are pregnant, do not drink alcohol. If you are breastfeeding, be very cautious about drinking alcohol. If you are not pregnant and choose to drink alcohol, do not exceed 1 drink per day. One drink is considered to be 12 ounces (355 mL) of beer, 5 ounces (148 mL) of wine, or 1.5 ounces (44 mL) of liquor.   Avoid use of street drugs. Do not share needles with anyone. Ask for help if you need support or instructions about stopping the use of drugs.   High blood pressure causes heart disease and increases the risk of stroke. Your blood pressure should be checked at least every 1 to 2 years. Ongoing high blood pressure should be treated with medicines if weight loss and exercise are not effective.   If you are 55 to 52   years old, ask your caregiver if you should take aspirin to prevent strokes.   Diabetes screening involves taking a blood sample to check your fasting blood sugar level. This should be done once every 3 years, after age 45, if you are within normal weight and without risk factors for diabetes. Testing should be considered at a younger age or be carried out more frequently if you are overweight and have at least 1 risk factor for diabetes.   Breast cancer screening is essential preventive care for women. You should practice "breast  self-awareness." This means understanding the normal appearance and feel of your breasts and may include breast self-examination. Any changes detected, no matter how small, should be reported to a caregiver. Women in their 20s and 30s should have a clinical breast exam (CBE) by a caregiver as part of a regular health exam every 1 to 3 years. After age 40, women should have a CBE every year. Starting at age 40, women should consider having a mammography (breast X-ray test) every year. Women who have a family history of breast cancer should talk to their caregiver about genetic screening. Women at a high risk of breast cancer should talk to their caregivers about having magnetic resonance imaging (MRI) and a mammography every year.   The Pap test is a screening test for cervical cancer. A Pap test can show cell changes on the cervix that might become cervical cancer if left untreated. A Pap test is a procedure in which cells are obtained and examined from the lower end of the uterus (cervix).   Women should have a Pap test starting at age 21.   Between ages 21 and 29, Pap tests should be repeated every 2 years.   Beginning at age 30, you should have a Pap test every 3 years as long as the past 3 Pap tests have been normal.   Some women have medical problems that increase the chance of getting cervical cancer. Talk to your caregiver about these problems. It is especially important to talk to your caregiver if a new problem develops soon after your last Pap test. In these cases, your caregiver may recommend more frequent screening and Pap tests.   The above recommendations are the same for women who have or have not gotten the vaccine for human papillomavirus (HPV).   If you had a hysterectomy for a problem that was not cancer or a condition that could lead to cancer, then you no longer need Pap tests. Even if you no longer need a Pap test, a regular exam is a good idea to make sure no other problems are  starting.   If you are between ages 65 and 70, and you have had normal Pap tests going back 10 years, you no longer need Pap tests. Even if you no longer need a Pap test, a regular exam is a good idea to make sure no other problems are starting.   If you have had past treatment for cervical cancer or a condition that could lead to cancer, you need Pap tests and screening for cancer for at least 20 years after your treatment.   If Pap tests have been discontinued, risk factors (such as a new sexual partner) need to be reassessed to determine if screening should be resumed.   The HPV test is an additional test that may be used for cervical cancer screening. The HPV test looks for the virus that can cause the cell changes on the cervix.   The cells collected during the Pap test can be tested for HPV. The HPV test could be used to screen women aged 30 years and older, and should be used in women of any age who have unclear Pap test results. After the age of 30, women should have HPV testing at the same frequency as a Pap test.   Colorectal cancer can be detected and often prevented. Most routine colorectal cancer screening begins at the age of 50 and continues through age 75. However, your caregiver may recommend screening at an earlier age if you have risk factors for colon cancer. On a yearly basis, your caregiver may provide home test kits to check for hidden blood in the stool. Use of a small camera at the end of a tube, to directly examine the colon (sigmoidoscopy or colonoscopy), can detect the earliest forms of colorectal cancer. Talk to your caregiver about this at age 50, when routine screening begins. Direct examination of the colon should be repeated every 5 to 10 years through age 75, unless early forms of pre-cancerous polyps or small growths are found.   Hepatitis C blood testing is recommended for all people born from 1945 through 1965 and any individual with known risks for hepatitis C.    Practice safe sex. Use condoms and avoid high-risk sexual practices to reduce the spread of sexually transmitted infections (STIs). STIs include gonorrhea, chlamydia, syphilis, trichomonas, herpes, HPV, and human immunodeficiency virus (HIV). Herpes, HIV, and HPV are viral illnesses that have no cure. They can result in disability, cancer, and death. Sexually active women aged 25 and younger should be checked for chlamydia. Older women with new or multiple partners should also be tested for chlamydia. Testing for other STIs is recommended if you are sexually active and at increased risk.   Osteoporosis is a disease in which the bones lose minerals and strength with aging. This can result in serious bone fractures. The risk of osteoporosis can be identified using a bone density scan. Women ages 65 and over and women at risk for fractures or osteoporosis should discuss screening with their caregivers. Ask your caregiver whether you should take a calcium supplement or vitamin D to reduce the rate of osteoporosis.   Menopause can be associated with physical symptoms and risks. Hormone replacement therapy is available to decrease symptoms and risks. You should talk to your caregiver about whether hormone replacement therapy is right for you.   Use sunscreen with sun protection factor (SPF) of 30 or more. Apply sunscreen liberally and repeatedly throughout the day. You should seek shade when your shadow is shorter than you. Protect yourself by wearing long sleeves, pants, a wide-brimmed hat, and sunglasses year round, whenever you are outdoors.   Once a month, do a whole body skin exam, using a mirror to look at the skin on your back. Notify your caregiver of new moles, moles that have irregular borders, moles that are larger than a pencil eraser, or moles that have changed in shape or color.   Stay current with required immunizations.   Influenza. You need a dose every fall (or winter). The composition of  the flu vaccine changes each year, so being vaccinated once is not enough.   Pneumococcal polysaccharide. You need 1 to 2 doses if you smoke cigarettes or if you have certain chronic medical conditions. You need 1 dose at age 65 (or older) if you have never been vaccinated.   Tetanus, diphtheria, pertussis (Tdap, Td). Get 1 dose of   Tdap vaccine if you are younger than age 65, are over 65 and have contact with an infant, are a healthcare worker, are pregnant, or simply want to be protected from whooping cough. After that, you need a Td booster dose every 10 years. Consult your caregiver if you have not had at least 3 tetanus and diphtheria-containing shots sometime in your life or have a deep or dirty wound.   HPV. You need this vaccine if you are a woman age 26 or younger. The vaccine is given in 3 doses over 6 months.   Measles, mumps, rubella (MMR). You need at least 1 dose of MMR if you were born in 1957 or later. You may also need a second dose.   Meningococcal. If you are age 19 to 21 and a first-year college student living in a residence hall, or have one of several medical conditions, you need to get vaccinated against meningococcal disease. You may also need additional booster doses.   Zoster (shingles). If you are age 60 or older, you should get this vaccine.   Varicella (chickenpox). If you have never had chickenpox or you were vaccinated but received only 1 dose, talk to your caregiver to find out if you need this vaccine.   Hepatitis A. You need this vaccine if you have a specific risk factor for hepatitis A virus infection or you simply wish to be protected from this disease. The vaccine is usually given as 2 doses, 6 to 18 months apart.   Hepatitis B. You need this vaccine if you have a specific risk factor for hepatitis B virus infection or you simply wish to be protected from this disease. The vaccine is given in 3 doses, usually over 6 months.  Preventive Services /  Frequency Ages 19 to 39  Blood pressure check.** / Every 1 to 2 years.   Lipid and cholesterol check.** / Every 5 years beginning at age 20.   Clinical breast exam.** / Every 3 years for women in their 20s and 30s.   Pap test.** / Every 2 years from ages 21 through 29. Every 3 years starting at age 30 through age 65 or 70 with a history of 3 consecutive normal Pap tests.   HPV screening.** / Every 3 years from ages 30 through ages 65 to 70 with a history of 3 consecutive normal Pap tests.   Hepatitis C blood test.** / For any individual with known risks for hepatitis C.   Skin self-exam. / Monthly.   Influenza immunization.** / Every year.   Pneumococcal polysaccharide immunization.** / 1 to 2 doses if you smoke cigarettes or if you have certain chronic medical conditions.   Tetanus, diphtheria, pertussis (Tdap, Td) immunization. / A one-time dose of Tdap vaccine. After that, you need a Td booster dose every 10 years.   HPV immunization. / 3 doses over 6 months, if you are 26 and younger.   Measles, mumps, rubella (MMR) immunization. / You need at least 1 dose of MMR if you were born in 1957 or later. You may also need a second dose.   Meningococcal immunization. / 1 dose if you are age 19 to 21 and a first-year college student living in a residence hall, or have one of several medical conditions, you need to get vaccinated against meningococcal disease. You may also need additional booster doses.   Varicella immunization.** / Consult your caregiver.   Hepatitis A immunization.** / Consult your caregiver. 2 doses, 6 to 18 months   apart.   Hepatitis B immunization.** / Consult your caregiver. 3 doses usually over 6 months.  Ages 40 to 64  Blood pressure check.** / Every 1 to 2 years.   Lipid and cholesterol check.** / Every 5 years beginning at age 20.   Clinical breast exam.** / Every year after age 40.   Mammogram.** / Every year beginning at age 40 and continuing for as  long as you are in good health. Consult with your caregiver.   Pap test.** / Every 3 years starting at age 30 through age 65 or 70 with a history of 3 consecutive normal Pap tests.   HPV screening.** / Every 3 years from ages 30 through ages 65 to 70 with a history of 3 consecutive normal Pap tests.   Fecal occult blood test (FOBT) of stool. / Every year beginning at age 50 and continuing until age 75. You may not need to do this test if you get a colonoscopy every 10 years.   Flexible sigmoidoscopy or colonoscopy.** / Every 5 years for a flexible sigmoidoscopy or every 10 years for a colonoscopy beginning at age 50 and continuing until age 75.   Hepatitis C blood test.** / For all people born from 1945 through 1965 and any individual with known risks for hepatitis C.   Skin self-exam. / Monthly.   Influenza immunization.** / Every year.   Pneumococcal polysaccharide immunization.** / 1 to 2 doses if you smoke cigarettes or if you have certain chronic medical conditions.   Tetanus, diphtheria, pertussis (Tdap, Td) immunization.** / A one-time dose of Tdap vaccine. After that, you need a Td booster dose every 10 years.   Measles, mumps, rubella (MMR) immunization. / You need at least 1 dose of MMR if you were born in 1957 or later. You may also need a second dose.   Varicella immunization.** / Consult your caregiver.   Meningococcal immunization.** / Consult your caregiver.   Hepatitis A immunization.** / Consult your caregiver. 2 doses, 6 to 18 months apart.   Hepatitis B immunization.** / Consult your caregiver. 3 doses, usually over 6 months.  Ages 65 and over  Blood pressure check.** / Every 1 to 2 years.   Lipid and cholesterol check.** / Every 5 years beginning at age 20.   Clinical breast exam.** / Every year after age 40.   Mammogram.** / Every year beginning at age 40 and continuing for as long as you are in good health. Consult with your caregiver.   Pap test.** /  Every 3 years starting at age 30 through age 65 or 70 with a 3 consecutive normal Pap tests. Testing can be stopped between 65 and 70 with 3 consecutive normal Pap tests and no abnormal Pap or HPV tests in the past 10 years.   HPV screening.** / Every 3 years from ages 30 through ages 65 or 70 with a history of 3 consecutive normal Pap tests. Testing can be stopped between 65 and 70 with 3 consecutive normal Pap tests and no abnormal Pap or HPV tests in the past 10 years.   Fecal occult blood test (FOBT) of stool. / Every year beginning at age 50 and continuing until age 75. You may not need to do this test if you get a colonoscopy every 10 years.   Flexible sigmoidoscopy or colonoscopy.** / Every 5 years for a flexible sigmoidoscopy or every 10 years for a colonoscopy beginning at age 50 and continuing until age 75.   Hepatitis   C blood test.** / For all people born from 1945 through 1965 and any individual with known risks for hepatitis C.   Osteoporosis screening.** / A one-time screening for women ages 65 and over and women at risk for fractures or osteoporosis.   Skin self-exam. / Monthly.   Influenza immunization.** / Every year.   Pneumococcal polysaccharide immunization.** / 1 dose at age 65 (or older) if you have never been vaccinated.   Tetanus, diphtheria, pertussis (Tdap, Td) immunization. / A one-time dose of Tdap vaccine if you are over 65 and have contact with an infant, are a healthcare worker, or simply want to be protected from whooping cough. After that, you need a Td booster dose every 10 years.   Varicella immunization.** / Consult your caregiver.   Meningococcal immunization.** / Consult your caregiver.   Hepatitis A immunization.** / Consult your caregiver. 2 doses, 6 to 18 months apart.   Hepatitis B immunization.** / Check with your caregiver. 3 doses, usually over 6 months.  ** Family history and personal history of risk and conditions may change your caregiver's  recommendations. Document Released: 05/19/2001 Document Revised: 03/12/2011 Document Reviewed: 08/18/2010 [employer] Patient Information 2012 [employer]. 

## 2011-08-07 NOTE — Assessment & Plan Note (Signed)
See above

## 2011-08-07 NOTE — Assessment & Plan Note (Signed)
vesicare 10 mg  Consider gyn or urology if no better

## 2011-08-07 NOTE — Progress Notes (Signed)
Subjective:     Ebony Curtis is a 51 y.o. female and is here for a comprehensive physical exam. The patient reports no problems.  History   Social History  . Marital Status: Single    Spouse Name: N/A    Number of Children: N/A  . Years of Education: N/A   Occupational History  . [employer]    disability   Social History Main Topics  . Smoking status: Current Everyday Smoker -- 1.0 packs/day    Types: Cigarettes  . Smokeless tobacco: Not on file  . Alcohol Use: No  . Drug Use: No  . Sexually Active: Not Currently -- Female partner(s)   Other Topics Concern  . Not on file   Social History Narrative  . No narrative on file   Health Maintenance  Topic Date Due  . Mammogram  03/29/2010  . Colonoscopy  03/29/2010  . Influenza Vaccine  01/05/2012  . Pap Smear  08/06/2012  . Tetanus/tdap  08/27/2019    The following portions of the patient's history were reviewed and updated as appropriate: allergies, current medications, past family history, past medical history, past social history, past surgical history and problem list.  Review of Systems Review of Systems  Constitutional: Negative for activity change, appetite change and fatigue.  HENT: Negative for hearing loss, congestion, tinnitus and ear discharge.  upper and low dentures Eyes: Negative for visual disturbance (see optho q3y -- vision corrected to 20/20 with glasses).  Respiratory: Negative for cough, chest tightness and shortness of breath.   Cardiovascular: Negative for chest pain, palpitations and leg swelling.  Gastrointestinal: Negative for abdominal pain, diarrhea, constipation and abdominal distention.  Genitourinary: Negative for urgency, frequency, decreased urine volume and difficulty urinating.  Musculoskeletal:positive for back pain, arthralgias ----saw neuro and ortho Skin: Negative for color change, pallor and rash.  Neurological: Negative for dizziness, light-headedness,  numbness and headaches.  Hematological: Negative for adenopathy. Does not bruise/bleed easily.  Psychiatric/Behavioral: Negative for suicidal ideas, confusion, sleep disturbance, self-injury, dysphoric mood, decreased concentration and agitation.       Objective:    BP 142/70  Pulse 82  Temp(Src) 98.3 F (36.8 C) (Oral)  Ht 5' 2.5" (1.588 m)  Wt 229 lb 6.4 oz (104.055 kg)  BMI 41.29 kg/m2  SpO2 96% General appearance: alert, cooperative, appears stated age and no distress Head: Normocephalic, without obvious abnormality, atraumatic Eyes: negative findings: lids and lashes normal, conjunctivae and sclerae normal, corneas clear and pupils equal, round, reactive to light and accomodation Ears: normal TM's and external ear canals both ears Nose: Nares normal. Septum midline. Mucosa normal. No drainage or sinus tenderness. Throat: lips, mucosa, and tongue normal; teeth and gums normal Neck: no adenopathy, no carotid bruit, no JVD, supple, symmetrical, trachea midline and thyroid not enlarged, symmetric, no tenderness/mass/nodules Back: symmetric, no curvature. ROM normal. No CVA tenderness. Lungs: clear to auscultation bilaterally Breasts: normal appearance, no masses or tenderness Heart: regular rate and rhythm, S1, S2 normal, no murmur, click, rub or gallop Abdomen: soft, non-tender; bowel sounds normal; no masses,  no organomegaly Pelvic: cervix normal in appearance, external genitalia normal, no adnexal masses or tenderness, no cervical motion tenderness, rectovaginal septum normal, uterus normal size, shape, and consistency and vagina normal without discharge Extremities: extremities normal, atraumatic, no cyanosis or edema Pulses: 2+ and symmetric Skin: Skin color, texture, turgor normal. No rashes or lesions Lymph nodes: Cervical, supraclavicular, and axillary nodes normal. Neurologic: Alert and oriented X 3, normal strength  and tone. Normal symmetric reflexes. Normal  coordination and gait psych--- no depression/ anxiety    Assessment:    Healthy female exam.      Plan:    ghm utd Check colon and mammo Pap done today Check fasting labs rto 6 months or sooner prn See After Visit Summary for Counseling Recommendations

## 2011-08-19 ENCOUNTER — Telehealth: Payer: Self-pay | Admitting: *Deleted

## 2011-08-19 MED ORDER — HYDROCHLOROTHIAZIDE 25 MG PO TABS: 25.0000 mg | ORAL_TABLET | Freq: Every day | ORAL | Status: DC

## 2011-08-19 NOTE — Telephone Encounter (Signed)
hctz  25 mg  1 po qd  #90  1 refills Needs ov if it is not working

## 2011-08-19 NOTE — Telephone Encounter (Addendum)
Pt states that she has began to retain fluid again and would like to restart her fluid pill. .Please advise

## 2011-08-19 NOTE — Telephone Encounter (Signed)
msg left to call the office     KP 

## 2011-08-21 ENCOUNTER — Telehealth: Payer: Self-pay | Admitting: *Deleted

## 2011-08-21 NOTE — Telephone Encounter (Signed)
CMA Arman Bogus noted has spoken to pt per concerns

## 2011-08-21 NOTE — Telephone Encounter (Signed)
Pt returned call

## 2011-09-01 ENCOUNTER — Other Ambulatory Visit: Payer: Federal, State, Local not specified - PPO | Admitting: Gastroenterology

## 2011-11-23 ENCOUNTER — Encounter: Payer: Self-pay | Admitting: Family Medicine

## 2011-11-23 ENCOUNTER — Ambulatory Visit (INDEPENDENT_AMBULATORY_CARE_PROVIDER_SITE_OTHER): Payer: Federal, State, Local not specified - PPO | Admitting: Family Medicine

## 2011-11-23 VITALS — BP 132/64 | HR 84 | Temp 99.1°F | Wt 232.0 lb

## 2011-11-23 DIAGNOSIS — J40 Bronchitis, not specified as acute or chronic: Secondary | ICD-10-CM

## 2011-11-23 MED ORDER — AMOXICILLIN-POT CLAVULANATE 875-125 MG PO TABS: 1.0000 | ORAL_TABLET | Freq: Two times a day (BID) | ORAL | Status: DC

## 2011-11-23 MED ORDER — ALBUTEROL SULFATE (5 MG/ML) 0.5% IN NEBU
2.5000 mg | INHALATION_SOLUTION | Freq: Once | RESPIRATORY_TRACT | Status: AC
  Administered 2011-11-23: 2.5 mg via RESPIRATORY_TRACT

## 2011-11-23 MED ORDER — AMOXICILLIN-POT CLAVULANATE 400-57 MG/5ML PO SUSR: ORAL | Status: DC

## 2011-11-23 MED ORDER — PREDNISONE 10 MG PO TABS: ORAL_TABLET | ORAL | Status: DC

## 2011-11-23 MED ORDER — METHYLPREDNISOLONE ACETATE 80 MG/ML IJ SUSP
80.0000 mg | Freq: Once | INTRAMUSCULAR | Status: AC
  Administered 2011-11-23: 80 mg via INTRAMUSCULAR

## 2011-11-23 MED ORDER — IPRATROPIUM BROMIDE 0.02 % IN SOLN
0.5000 mg | Freq: Once | RESPIRATORY_TRACT | Status: AC
  Administered 2011-11-23: 0.5 mg via RESPIRATORY_TRACT

## 2011-11-23 NOTE — Patient Instructions (Addendum)

## 2011-11-23 NOTE — Progress Notes (Signed)
  Subjective:     Ebony Curtis is a 51 y.o. female here for evaluation of a cough. Onset of symptoms was 1 week ago. Symptoms have been gradually worsening since that time. The cough is productive and is aggravated by infection and reclining position. Associated symptoms include: chills, fever, shortness of breath, sputum production and wheezing. Patient does not have a history of asthma. Patient does have a history of environmental allergens. Patient has not traveled recently. Patient does have a history of smoking. Patient has not had a previous chest x-ray. Patient has not had a PPD done.  The following portions of the patient's history were reviewed and updated as appropriate: allergies, current medications, past family history, past medical history, past social history, past surgical history and problem list.  Review of Systems Pertinent items are noted in HPI.    Objective:    Oxygen saturation 94% on room air BP 132/64  Pulse 84  Temp 99.1 F (37.3 C) (Oral)  Wt 232 lb (105.235 kg)  SpO2 94% General appearance: alert, cooperative, appears stated age and mild distress Ears: normal TM's and external ear canals both ears Nose: Nares normal. Septum midline. Mucosa normal. No drainage or sinus tenderness. Throat: lips, mucosa, and tongue normal; teeth and gums normal Neck: mild anterior cervical adenopathy, supple, symmetrical, trachea midline and thyroid not enlarged, symmetric, no tenderness/mass/nodules Lungs: wheezes bilaterally    Assessment:    Acute Bronchitis    Plan:    Antibiotics per medication orders. Antitussives per medication orders. Avoid exposure to tobacco smoke and fumes. B-agonist inhaler. Call if shortness of breath worsens, blood in sputum, change in character of cough, development of fever or chills, inability to maintain nutrition and hydration. Avoid exposure to tobacco smoke and fumes. depomedrol and pred taper

## 2011-12-08 ENCOUNTER — Encounter: Payer: Self-pay | Admitting: Family Medicine

## 2011-12-08 ENCOUNTER — Ambulatory Visit (INDEPENDENT_AMBULATORY_CARE_PROVIDER_SITE_OTHER): Payer: Federal, State, Local not specified - PPO | Admitting: Family Medicine

## 2011-12-08 ENCOUNTER — Ambulatory Visit (HOSPITAL_BASED_OUTPATIENT_CLINIC_OR_DEPARTMENT_OTHER)
Admission: RE | Admit: 2011-12-08 | Discharge: 2011-12-08 | Disposition: A | Discharge status: Home / Self Care | Payer: Federal, State, Local not specified - PPO | Source: Ambulatory Visit | Attending: Family Medicine | Admitting: Family Medicine

## 2011-12-08 VITALS — BP 130/70 | HR 106 | Temp 99.0°F | Wt 226.4 lb

## 2011-12-08 DIAGNOSIS — J189 Pneumonia, unspecified organism: Secondary | ICD-10-CM

## 2011-12-08 DIAGNOSIS — R05 Cough: Secondary | ICD-10-CM

## 2011-12-08 DIAGNOSIS — R059 Cough, unspecified: Secondary | ICD-10-CM | POA: Insufficient documentation

## 2011-12-08 MED ORDER — MOXIFLOXACIN HCL 400 MG PO TABS: 400.0000 mg | ORAL_TABLET | Freq: Every day | ORAL | Status: AC

## 2011-12-08 MED ORDER — GUAIFENESIN-CODEINE 100-10 MG/5ML PO SYRP: ORAL_SOLUTION | ORAL | Status: DC

## 2011-12-08 NOTE — Patient Instructions (Signed)

## 2011-12-08 NOTE — Progress Notes (Signed)
  Subjective:     Ebony Curtis is a 51 y.o. female here for evaluation of a cough. Onset of symptoms was several weeks ago. Symptoms were improving after last visit but then came back when meds finished--see last ov.   The cough is productive and is aggravated by exercise, infection and reclining position. Associated symptoms include: postnasal drip, shortness of breath, sputum production and wheezing. Patient does not have a history of asthma. Patiesnt does have a history of environmental allergens. Patient has not traveled recently. Patient does have a history of smoking. Patient has not had a previous chest x-ray. Patient has not had a PPD done.  The following portions of the patient's history were reviewed and updated as appropriate: allergies, current medications, past family history, past medical history, past social history, past surgical history and problem list.  Review of Systems Pertinent items are noted in HPI.    Objective:    Oxygen saturation 93% on room air BP 130/70  Pulse 106  Temp 99 F (37.2 C) (Oral)  Wt 226 lb 6.4 oz (102.694 kg)  SpO2 93% General appearance: alert, cooperative, appears stated age and mild distress Lungs: rhonchi bilaterally and wheezes bilaterally Heart: S1, S2 normal Extremities: extremities normal, atraumatic, no cyanosis or edema    Assessment:    Acute Bronchitis and r/o pneumonia    Plan:    Antibiotics per medication orders. Antitussives per medication orders. Avoid exposure to tobacco smoke and fumes. B-agonist inhaler. Call if shortness of breath worsens, blood in sputum, change in character of cough, development of fever or chills, inability to maintain nutrition and hydration. Avoid exposure to tobacco smoke and fumes. Chest x-ray. Steroid inhaler as ordered. Trial of antihistamines.

## 2011-12-14 ENCOUNTER — Ambulatory Visit (HOSPITAL_BASED_OUTPATIENT_CLINIC_OR_DEPARTMENT_OTHER)
Admission: RE | Admit: 2011-12-14 | Discharge: 2011-12-14 | Disposition: A | Discharge status: Home / Self Care | Payer: Federal, State, Local not specified - PPO | Source: Ambulatory Visit | Attending: Family Medicine | Admitting: Family Medicine

## 2011-12-14 DIAGNOSIS — Z1231 Encounter for screening mammogram for malignant neoplasm of breast: Secondary | ICD-10-CM | POA: Insufficient documentation

## 2011-12-14 DIAGNOSIS — Z1239 Encounter for other screening for malignant neoplasm of breast: Secondary | ICD-10-CM

## 2011-12-21 ENCOUNTER — Ambulatory Visit: Payer: Federal, State, Local not specified - PPO | Admitting: Family Medicine

## 2012-03-15 ENCOUNTER — Encounter: Payer: Self-pay | Admitting: Physical Medicine & Rehabilitation

## 2012-03-15 ENCOUNTER — Encounter: Payer: Self-pay | Admitting: Family Medicine

## 2012-03-15 ENCOUNTER — Ambulatory Visit (INDEPENDENT_AMBULATORY_CARE_PROVIDER_SITE_OTHER): Payer: Federal, State, Local not specified - PPO | Admitting: Family Medicine

## 2012-03-15 VITALS — BP 128/62 | HR 86 | Temp 98.0°F | Wt 235.2 lb

## 2012-03-15 DIAGNOSIS — M549 Dorsalgia, unspecified: Secondary | ICD-10-CM

## 2012-03-15 DIAGNOSIS — E785 Hyperlipidemia, unspecified: Secondary | ICD-10-CM

## 2012-03-15 LAB — BASIC METABOLIC PANEL
CO2: 26 mEq/L (ref 19–32)
Chloride: 104 mEq/L (ref 96–112)
Glucose, Bld: 85 mg/dL (ref 70–99)
Potassium: 4.1 mEq/L (ref 3.5–5.1)
Sodium: 137 mEq/L (ref 135–145)

## 2012-03-15 LAB — LIPID PANEL
Cholesterol: 210 mg/dL — ABNORMAL HIGH (ref 0–200)
HDL: 44.3 mg/dL (ref >39.00)
Total CHOL/HDL Ratio: 5
Triglycerides: 80 mg/dL (ref 0.0–149.0)
VLDL: 16 mg/dL (ref 0.0–40.0)

## 2012-03-15 LAB — CBC WITH DIFFERENTIAL/PLATELET
Basophils Absolute: 0.1 10*3/uL (ref 0.0–0.1)
Basophils Relative: 0.6 % (ref 0.0–3.0)
Eosinophils Absolute: 0.2 10*3/uL (ref 0.0–0.7)
HCT: 42 % (ref 36.0–46.0)
Hemoglobin: 13.4 g/dL (ref 12.0–15.0)
Lymphocytes Relative: 50 % — ABNORMAL HIGH (ref 12.0–46.0)
Lymphs Abs: 5.1 10*3/uL — ABNORMAL HIGH (ref 0.7–4.0)
MCHC: 31.9 g/dL (ref 30.0–36.0)
Neutro Abs: 4.3 10*3/uL (ref 1.4–7.7)
RBC: 4.42 Mil/uL (ref 3.87–5.11)
RDW: 14.7 % — ABNORMAL HIGH (ref 11.5–14.6)

## 2012-03-15 LAB — HEPATIC FUNCTION PANEL
AST: 17 U/L (ref 0–37)
Albumin: 3.8 g/dL (ref 3.5–5.2)
Alkaline Phosphatase: 63 U/L (ref 39–117)
Total Protein: 7.6 g/dL (ref 6.0–8.3)

## 2012-03-15 LAB — LDL CHOLESTEROL, DIRECT: Direct LDL: 153.1 mg/dL

## 2012-03-15 NOTE — Patient Instructions (Addendum)
Back Pain, Adult Low back pain is very common. About 1 in 5 people have back pain.The cause of low back pain is rarely dangerous. The pain often gets better over time.About half of people with a sudden onset of back pain feel better in just 2 weeks. About 8 in 10 people feel better by 6 weeks.  CAUSES Some common causes of back pain include:  Strain of the muscles or ligaments supporting the spine.  Wear and tear (degeneration) of the spinal discs.  Arthritis.  Direct injury to the back. DIAGNOSIS Most of the time, the direct cause of low back pain is not known.However, back pain can be treated effectively even when the exact cause of the pain is unknown.Answering your caregiver's questions about your overall health and symptoms is one of the most accurate ways to make sure the cause of your pain is not dangerous. If your caregiver needs more information, he or she may order lab work or imaging tests (X-rays or MRIs).However, even if imaging tests show changes in your back, this usually does not require surgery. HOME CARE INSTRUCTIONS For many people, back pain returns.Since low back pain is rarely dangerous, it is often a condition that people can learn to manageon their own.   Remain active. It is stressful on the back to sit or stand in one place. Do not sit, drive, or stand in one place for more than 30 minutes at a time. Take short walks on level surfaces as soon as pain allows.Try to increase the length of time you walk each day.  Do not stay in bed.Resting more than 1 or 2 days can delay your recovery.  Do not avoid exercise or work.Your body is made to move.It is not dangerous to be active, even though your back may hurt.Your back will likely heal faster if you return to being active before your pain is gone.  Pay attention to your body when you bend and lift. Many people have less discomfortwhen lifting if they bend their knees, keep the load close to their bodies,and  avoid twisting. Often, the most comfortable positions are those that put less stress on your recovering back.  Find a comfortable position to sleep. Use a firm mattress and lie on your side with your knees slightly bent. If you lie on your back, put a pillow under your knees.  Only take over-the-counter or prescription medicines as directed by your caregiver. Over-the-counter medicines to reduce pain and inflammation are often the most helpful.Your caregiver may prescribe muscle relaxant drugs.These medicines help dull your pain so you can more quickly return to your normal activities and healthy exercise.  Put ice on the injured area.  Put ice in a plastic bag.  Place a towel between your skin and the bag.  Leave the ice on for 15 to 20 minutes, 3 to 4 times a day for the first 2 to 3 days. After that, ice and heat may be alternated to reduce pain and spasms.  Ask your caregiver about trying back exercises and gentle massage. This may be of some benefit.  Avoid feeling anxious or stressed.Stress increases muscle tension and can worsen back pain.It is important to recognize when you are anxious or stressed and learn ways to manage it.Exercise is a great option. SEEK MEDICAL CARE IF:  You have pain that is not relieved with rest or medicine.  You have pain that does not improve in 1 week.  You have new symptoms.  You are generally   not feeling well. SEEK IMMEDIATE MEDICAL CARE IF:   You have pain that radiates from your back into your legs.  You develop new bowel or bladder control problems.  You have unusual weakness or numbness in your arms or legs.  You develop nausea or vomiting.  You develop abdominal pain.  You feel faint. Document Released: 03/23/2005 Document Revised: 09/22/2011 Document Reviewed: 08/11/2010 Exit[employer] Patient Information 2013 [employer].  

## 2012-03-15 NOTE — Progress Notes (Signed)
  Subjective:    Ebony Curtis is a 51 y.o. female who presents for follow up of low back problems. Current symptoms include: numbness in L leg and pain in low back (burning and tingling in character; 7/10 in severity). Symptoms have not changed from the previous visit. Exacerbating factors identified by the patient are bending backwards, bending forwards, bending sideways, recumbency, sitting, standing and walking.  The following portions of the patient's history were reviewed and updated as appropriate: allergies, current medications, past family history, past medical history, past social history, past surgical history and problem list.    Objective:    BP 128/62  Pulse 86  Temp 98 F (36.7 C) (Oral)  Wt 235 lb 3.2 oz (106.686 kg)  SpO2 95% General appearance: alert, cooperative, appears stated age and mild distress Throat: lips, mucosa, and tongue normal; teeth and gums normal Neck: no adenopathy, no carotid bruit, no JVD, supple, symmetrical, trachea midline and thyroid not enlarged, symmetric, no tenderness/mass/nodules Lungs: clear to auscultation bilaterally Heart: S1, S2 normal Extremities: extremities normal, atraumatic, no cyanosis or edema Neurologic: Motor: normal Reflexes: 2+ and symmetric Coordination: normal Gait: Antalgic    Assessment:    Nonspecific acute low back pain    Plan:    pt referred back to pain management and pt for evaluation  Pt unable to do her job and was unable to tolerate more then 3 hours light duty when tried

## 2012-04-01 ENCOUNTER — Telehealth: Payer: Self-pay

## 2012-04-01 NOTE — Telephone Encounter (Signed)
Patient wanted to make sure her appointment is under workers comp.  Patient will talk to front office.

## 2012-04-18 ENCOUNTER — Ambulatory Visit: Payer: Federal, State, Local not specified - PPO | Admitting: Physical Medicine & Rehabilitation

## 2012-04-26 ENCOUNTER — Encounter: Payer: Self-pay | Admitting: Family Medicine

## 2012-04-26 ENCOUNTER — Ambulatory Visit (INDEPENDENT_AMBULATORY_CARE_PROVIDER_SITE_OTHER): Payer: Federal, State, Local not specified - PPO | Admitting: Family Medicine

## 2012-04-26 VITALS — BP 140/76 | HR 78 | Temp 98.8°F | Wt 235.8 lb

## 2012-04-26 DIAGNOSIS — R32 Unspecified urinary incontinence: Secondary | ICD-10-CM

## 2012-04-26 DIAGNOSIS — M549 Dorsalgia, unspecified: Secondary | ICD-10-CM

## 2012-04-26 DIAGNOSIS — IMO0002 Reserved for concepts with insufficient information to code with codable children: Secondary | ICD-10-CM

## 2012-04-26 MED ORDER — SOLIFENACIN SUCCINATE 10 MG PO TABS: 5.0000 mg | ORAL_TABLET | Freq: Every day | ORAL | Status: AC

## 2012-04-26 NOTE — Progress Notes (Signed)
  Subjective:    Patient ID: Ebony Curtis, female    DOB: June 09, 1960, 52 y.o.   MRN: [id number]  HPI Pt here for 6 month f/u disability---she needs paperwork filled out.  No new complaints.    Review of Systems    as above Objective:   Physical Exam  BP 140/76  Pulse 78  Temp 98.8 F (37.1 C) (Oral)  Wt 235 lb 12.8 oz (106.958 kg)  SpO2 97% General appearance: alert, cooperative, appears stated age and no distress Neurologic: Alert and oriented X 3, normal strength and tone. Normal symmetric reflexes. Normal coordination and gait      Assessment & Plan:

## 2012-04-26 NOTE — Assessment & Plan Note (Signed)
Paperwork filled out Pt and aquatic therapy referral put it

## 2012-06-02 ENCOUNTER — Encounter: Payer: Self-pay | Admitting: Family Medicine

## 2012-06-14 ENCOUNTER — Other Ambulatory Visit: Payer: Self-pay | Admitting: Family Medicine

## 2012-06-20 ENCOUNTER — Other Ambulatory Visit: Payer: Self-pay | Admitting: Family Medicine

## 2012-06-20 DIAGNOSIS — R52 Pain, unspecified: Secondary | ICD-10-CM

## 2012-06-20 MED ORDER — DIAZEPAM 5 MG PO TABS: 5.0000 mg | ORAL_TABLET | Freq: Four times a day (QID) | ORAL | Status: DC | PRN

## 2012-06-20 MED ORDER — TRAMADOL HCL 50 MG PO TABS
50.0000 mg | ORAL_TABLET | Freq: Four times a day (QID) | ORAL | Status: DC | PRN
Start: 2012-06-20 — End: 2013-12-04

## 2012-06-20 NOTE — Telephone Encounter (Signed)
Patient states that she would like Kim to call her regarding paperwork that Dr. Laury Axon has filled out.

## 2012-06-20 NOTE — Telephone Encounter (Signed)
msg left to call the office     KP 

## 2012-06-20 NOTE — Telephone Encounter (Signed)
Last seen 04/26/12 and filled Valium 02/06/11 # 30 with 1 refill. Please advise     KP

## 2012-06-22 ENCOUNTER — Telehealth: Payer: Self-pay | Admitting: *Deleted

## 2012-06-22 MED ORDER — DIAZEPAM 5 MG PO TABS: 5.0000 mg | ORAL_TABLET | Freq: Four times a day (QID) | ORAL | Status: DC | PRN

## 2012-06-22 NOTE — Addendum Note (Signed)
Addended by: Arnette Norris on: 06/22/2012 05:11 PM   Modules accepted: Orders

## 2012-06-22 NOTE — Telephone Encounter (Signed)
Forms completed and faxed to [phone] and scan to chart

## 2012-06-24 ENCOUNTER — Telehealth: Payer: Self-pay | Admitting: Family Medicine

## 2012-06-24 NOTE — Telephone Encounter (Signed)
Peggy from [employer] states they need an updated return to work status for this patient. Please fax to [phone]

## 2012-06-24 NOTE — Telephone Encounter (Signed)
Haven't seen her since January---need ov and has any other Drs given her a return to work date?

## 2012-06-27 NOTE — Telephone Encounter (Signed)
Dr.Lowne is requesting patient be seen, please offer her an apt.    KP

## 2012-06-28 NOTE — Telephone Encounter (Signed)
Pt scheduled for Thursday, 06/30/12.

## 2012-06-30 ENCOUNTER — Ambulatory Visit: Payer: Federal, State, Local not specified - PPO | Admitting: Family Medicine

## 2012-07-04 ENCOUNTER — Ambulatory Visit (INDEPENDENT_AMBULATORY_CARE_PROVIDER_SITE_OTHER): Payer: Federal, State, Local not specified - PPO | Admitting: Family Medicine

## 2012-07-04 ENCOUNTER — Encounter: Payer: Self-pay | Admitting: Family Medicine

## 2012-07-04 VITALS — BP 124/70 | HR 88 | Temp 98.8°F | Wt 234.0 lb

## 2012-07-04 DIAGNOSIS — E785 Hyperlipidemia, unspecified: Secondary | ICD-10-CM

## 2012-07-04 DIAGNOSIS — IMO0002 Reserved for concepts with insufficient information to code with codable children: Secondary | ICD-10-CM

## 2012-07-04 DIAGNOSIS — R52 Pain, unspecified: Secondary | ICD-10-CM

## 2012-07-04 DIAGNOSIS — M549 Dorsalgia, unspecified: Secondary | ICD-10-CM

## 2012-07-04 MED ORDER — NAFTIFINE HCL 1 % EX CREA: TOPICAL_CREAM | Freq: Every day | CUTANEOUS | Status: DC

## 2012-07-04 NOTE — Patient Instructions (Signed)
Back Pain, Adult Low back pain is very common. About 1 in 5 people have back pain.The cause of low back pain is rarely dangerous. The pain often gets better over time.About half of people with a sudden onset of back pain feel better in just 2 weeks. About 8 in 10 people feel better by 6 weeks.  CAUSES Some common causes of back pain include:  Strain of the muscles or ligaments supporting the spine.  Wear and tear (degeneration) of the spinal discs.  Arthritis.  Direct injury to the back. DIAGNOSIS Most of the time, the direct cause of low back pain is not known.However, back pain can be treated effectively even when the exact cause of the pain is unknown.Answering your caregiver's questions about your overall health and symptoms is one of the most accurate ways to make sure the cause of your pain is not dangerous. If your caregiver needs more information, he or she may order lab work or imaging tests (X-rays or MRIs).However, even if imaging tests show changes in your back, this usually does not require surgery. HOME CARE INSTRUCTIONS For many people, back pain returns.Since low back pain is rarely dangerous, it is often a condition that people can learn to manageon their own.   Remain active. It is stressful on the back to sit or stand in one place. Do not sit, drive, or stand in one place for more than 30 minutes at a time. Take short walks on level surfaces as soon as pain allows.Try to increase the length of time you walk each day.  Do not stay in bed.Resting more than 1 or 2 days can delay your recovery.  Do not avoid exercise or work.Your body is made to move.It is not dangerous to be active, even though your back may hurt.Your back will likely heal faster if you return to being active before your pain is gone.  Pay attention to your body when you bend and lift. Many people have less discomfortwhen lifting if they bend their knees, keep the load close to their bodies,and  avoid twisting. Often, the most comfortable positions are those that put less stress on your recovering back.  Find a comfortable position to sleep. Use a firm mattress and lie on your side with your knees slightly bent. If you lie on your back, put a pillow under your knees.  Only take over-the-counter or prescription medicines as directed by your caregiver. Over-the-counter medicines to reduce pain and inflammation are often the most helpful.Your caregiver may prescribe muscle relaxant drugs.These medicines help dull your pain so you can more quickly return to your normal activities and healthy exercise.  Put ice on the injured area.  Put ice in a plastic bag.  Place a towel between your skin and the bag.  Leave the ice on for 15 to 20 minutes, 3 to 4 times a day for the first 2 to 3 days. After that, ice and heat may be alternated to reduce pain and spasms.  Ask your caregiver about trying back exercises and gentle massage. This may be of some benefit.  Avoid feeling anxious or stressed.Stress increases muscle tension and can worsen back pain.It is important to recognize when you are anxious or stressed and learn ways to manage it.Exercise is a great option. SEEK MEDICAL CARE IF:  You have pain that is not relieved with rest or medicine.  You have pain that does not improve in 1 week.  You have new symptoms.  You are generally   not feeling well. SEEK IMMEDIATE MEDICAL CARE IF:   You have pain that radiates from your back into your legs.  You develop new bowel or bladder control problems.  You have unusual weakness or numbness in your arms or legs.  You develop nausea or vomiting.  You develop abdominal pain.  You feel faint. Document Released: 03/23/2005 Document Revised: 09/22/2011 Document Reviewed: 08/11/2010 [employer] Patient Information 2013 [employer].  

## 2012-07-04 NOTE — Assessment & Plan Note (Signed)
con't with PT for 1 more month rto 1 month

## 2012-07-04 NOTE — Progress Notes (Signed)
  Subjective:    Patient ID: Ebony Curtis, female    DOB: [birth year], 52 y.o.   MRN: [id number]  HPI Pt here f/u PT and back pain.  She is doing better but PT is still working with her in water and doing trigger points.   PT is asking to con't therapy for a little while longer.     Review of Systems    as above Objective:   Physical Exam BP 124/70  Pulse 88  Temp(Src) 98.8 F (37.1 C) (Oral)  Wt 234 lb (106.142 kg)  BMI 42.09 kg/m2  SpO2 95% General appearance: alert, cooperative, appears stated age and no distress Extremities: extremities normal, atraumatic, no cyanosis or edema       Assessment & Plan:

## 2012-08-01 ENCOUNTER — Ambulatory Visit: Payer: Federal, State, Local not specified - PPO | Admitting: Family Medicine

## 2012-08-10 ENCOUNTER — Ambulatory Visit (INDEPENDENT_AMBULATORY_CARE_PROVIDER_SITE_OTHER): Payer: Federal, State, Local not specified - PPO | Admitting: Family Medicine

## 2012-08-10 ENCOUNTER — Encounter: Payer: Self-pay | Admitting: Family Medicine

## 2012-08-10 VITALS — BP 130/74 | HR 85 | Temp 98.1°F | Wt 233.8 lb

## 2012-08-10 DIAGNOSIS — E785 Hyperlipidemia, unspecified: Secondary | ICD-10-CM

## 2012-08-10 DIAGNOSIS — I10 Essential (primary) hypertension: Secondary | ICD-10-CM

## 2012-08-10 DIAGNOSIS — N390 Urinary tract infection, site not specified: Secondary | ICD-10-CM

## 2012-08-10 DIAGNOSIS — D229 Melanocytic nevi, unspecified: Secondary | ICD-10-CM

## 2012-08-10 DIAGNOSIS — Z0000 Encounter for general adult medical examination without abnormal findings: Secondary | ICD-10-CM

## 2012-08-10 DIAGNOSIS — F17200 Nicotine dependence, unspecified, uncomplicated: Secondary | ICD-10-CM

## 2012-08-10 DIAGNOSIS — M549 Dorsalgia, unspecified: Secondary | ICD-10-CM

## 2012-08-10 DIAGNOSIS — D239 Other benign neoplasm of skin, unspecified: Secondary | ICD-10-CM

## 2012-08-10 LAB — CBC WITH DIFFERENTIAL/PLATELET
Basophils Absolute: 0.1 10*3/uL (ref 0.0–0.1)
HCT: 43 % (ref 36.0–46.0)
Hemoglobin: 14.5 g/dL (ref 12.0–15.0)
Lymphs Abs: 3.4 10*3/uL (ref 0.7–4.0)
MCV: 91 fl (ref 78.0–100.0)
Monocytes Absolute: 0.2 10*3/uL (ref 0.1–1.0)
Monocytes Relative: 2.9 % — ABNORMAL LOW (ref 3.0–12.0)
Neutro Abs: 4.2 10*3/uL (ref 1.4–7.7)
RDW: 14.2 % (ref 11.5–14.6)

## 2012-08-10 LAB — BASIC METABOLIC PANEL
CO2: 24 mEq/L (ref 19–32)
Chloride: 105 mEq/L (ref 96–112)
GFR: 90.2 mL/min (ref >60.00)
Glucose, Bld: 112 mg/dL — ABNORMAL HIGH (ref 70–99)
Potassium: 4.2 mEq/L (ref 3.5–5.1)
Sodium: 138 mEq/L (ref 135–145)

## 2012-08-10 LAB — LIPID PANEL
Cholesterol: 185 mg/dL (ref 0–200)
VLDL: 20.6 mg/dL (ref 0.0–40.0)

## 2012-08-10 LAB — HEPATIC FUNCTION PANEL
ALT: 19 U/L (ref 0–35)
AST: 20 U/L (ref 0–37)
Albumin: 3.8 g/dL (ref 3.5–5.2)

## 2012-08-10 NOTE — Addendum Note (Signed)
Addended by: Lelon Perla on: 08/10/2012 10:36 AM   Modules accepted: Orders

## 2012-08-10 NOTE — Progress Notes (Signed)
  Subjective:    Patient here for follow-up of elevated blood pressure.  She is exercising and is adherent to a low-salt diet.  Blood pressure is well controlled at home. Cardiac symptoms: none. Patient denies: chest pain, chest pressure/discomfort, claudication, dyspnea, exertional chest pressure/discomfort, fatigue, irregular heart beat, lower extremity edema, near-syncope, orthopnea, palpitations, paroxysmal nocturnal dyspnea, syncope and tachypnea. Cardiovascular risk factors: dyslipidemia, hypertension and obesity (BMI >= 30 kg/m2). Use of agents associated with hypertension: none. History of target organ damage: none.  Pt is also here to f/u back pain--- she is doing great in pool  --pain 2/10 but she has inc pain when doing anything repetitive and walking on concrete.  She does fine on treadmill.    Pt is also here for labs for lipids and bmp.    The following portions of the patient's history were reviewed and updated as appropriate: allergies, current medications, past family history, past medical history, past social history, past surgical history and problem list.  Review of Systems Pertinent items are noted in HPI.     Objective:    BP 130/74  Pulse 85  Temp(Src) 98.1 F (36.7 C) (Oral)  Wt 233 lb 12.8 oz (106.051 kg)  BMI 42.05 kg/m2  SpO2 96% General appearance: alert, cooperative, appears stated age and no distress Neck: no adenopathy, no carotid bruit, no JVD, supple, symmetrical, trachea midline and thyroid not enlarged, symmetric, no tenderness/mass/nodules Lungs: clear to auscultation bilaterally Heart: S1, S2 normal Extremities: extremities normal, atraumatic, no cyanosis or edema Neurologic: Alert and oriented X 3, normal strength and tone. Normal symmetric reflexes. Normal coordination and gait    Assessment:    Hypertension, normal blood pressure . Evidence of target organ damage: none.    Plan:    Medication: no change. Dietary sodium restriction. Regular  aerobic exercise. Check blood pressures 2-3 times weekly and record. Follow up: 6 months and as needed.

## 2012-08-10 NOTE — Patient Instructions (Addendum)
Back Pain, Adult Low back pain is very common. About 1 in 5 people have back pain.The cause of low back pain is rarely dangerous. The pain often gets better over time.About half of people with a sudden onset of back pain feel better in just 2 weeks. About 8 in 10 people feel better by 6 weeks.  CAUSES Some common causes of back pain include:  Strain of the muscles or ligaments supporting the spine.  Wear and tear (degeneration) of the spinal discs.  Arthritis.  Direct injury to the back. DIAGNOSIS Most of the time, the direct cause of low back pain is not known.However, back pain can be treated effectively even when the exact cause of the pain is unknown.Answering your caregiver's questions about your overall health and symptoms is one of the most accurate ways to make sure the cause of your pain is not dangerous. If your caregiver needs more information, he or she may order lab work or imaging tests (X-rays or MRIs).However, even if imaging tests show changes in your back, this usually does not require surgery. HOME CARE INSTRUCTIONS For many people, back pain returns.Since low back pain is rarely dangerous, it is often a condition that people can learn to manageon their own.   Remain active. It is stressful on the back to sit or stand in one place. Do not sit, drive, or stand in one place for more than 30 minutes at a time. Take short walks on level surfaces as soon as pain allows.Try to increase the length of time you walk each day.  Do not stay in bed.Resting more than 1 or 2 days can delay your recovery.  Do not avoid exercise or work.Your body is made to move.It is not dangerous to be active, even though your back may hurt.Your back will likely heal faster if you return to being active before your pain is gone.  Pay attention to your body when you bend and lift. Many people have less discomfortwhen lifting if they bend their knees, keep the load close to their bodies,and  avoid twisting. Often, the most comfortable positions are those that put less stress on your recovering back.  Find a comfortable position to sleep. Use a firm mattress and lie on your side with your knees slightly bent. If you lie on your back, put a pillow under your knees.  Only take over-the-counter or prescription medicines as directed by your caregiver. Over-the-counter medicines to reduce pain and inflammation are often the most helpful.Your caregiver may prescribe muscle relaxant drugs.These medicines help dull your pain so you can more quickly return to your normal activities and healthy exercise.  Put ice on the injured area.  Put ice in a plastic bag.  Place a towel between your skin and the bag.  Leave the ice on for 15 to 20 minutes, 3 to 4 times a day for the first 2 to 3 days. After that, ice and heat may be alternated to reduce pain and spasms.  Ask your caregiver about trying back exercises and gentle massage. This may be of some benefit.  Avoid feeling anxious or stressed.Stress increases muscle tension and can worsen back pain.It is important to recognize when you are anxious or stressed and learn ways to manage it.Exercise is a great option. SEEK MEDICAL CARE IF:  You have pain that is not relieved with rest or medicine.  You have pain that does not improve in 1 week.  You have new symptoms.  You are generally   not feeling well. SEEK IMMEDIATE MEDICAL CARE IF:   You have pain that radiates from your back into your legs.  You develop new bowel or bladder control problems.  You have unusual weakness or numbness in your arms or legs.  You develop nausea or vomiting.  You develop abdominal pain.  You feel faint. Document Released: 03/23/2005 Document Revised: 09/22/2011 Document Reviewed: 08/11/2010 Exit[employer] Patient Information 2013 [employer].  

## 2012-08-10 NOTE — Assessment & Plan Note (Signed)
Check labs 

## 2012-08-10 NOTE — Assessment & Plan Note (Signed)
Stable con't meds 

## 2012-08-10 NOTE — Assessment & Plan Note (Signed)
Discussed with pt quitting Not ready yet

## 2012-08-11 LAB — POCT URINALYSIS DIPSTICK
Glucose, UA: NEGATIVE
Ketones, UA: NEGATIVE
Spec Grav, UA: >=1.03

## 2012-08-11 NOTE — Addendum Note (Signed)
Addended by: Silvio Pate D on: 08/11/2012 09:19 AM   Modules accepted: Orders

## 2012-08-13 LAB — URINE CULTURE: Colony Count: 80000

## 2012-09-12 ENCOUNTER — Ambulatory Visit: Payer: Federal, State, Local not specified - PPO | Admitting: Family Medicine

## 2012-09-12 DIAGNOSIS — Z0289 Encounter for other administrative examinations: Secondary | ICD-10-CM

## 2012-09-20 ENCOUNTER — Other Ambulatory Visit: Payer: Self-pay | Admitting: *Deleted

## 2012-09-20 DIAGNOSIS — E785 Hyperlipidemia, unspecified: Secondary | ICD-10-CM

## 2012-09-20 MED ORDER — SIMVASTATIN 40 MG PO TABS: 40.0000 mg | ORAL_TABLET | Freq: Every day | ORAL | Status: DC

## 2012-09-20 NOTE — Telephone Encounter (Signed)
Rx sent 

## 2012-10-01 ENCOUNTER — Other Ambulatory Visit: Payer: Self-pay | Admitting: Family Medicine

## 2012-11-15 ENCOUNTER — Telehealth: Payer: Self-pay | Admitting: *Deleted

## 2012-11-15 ENCOUNTER — Ambulatory Visit (INDEPENDENT_AMBULATORY_CARE_PROVIDER_SITE_OTHER): Payer: Federal, State, Local not specified - PPO | Admitting: Family Medicine

## 2012-11-15 ENCOUNTER — Encounter: Payer: Self-pay | Admitting: Family Medicine

## 2012-11-15 VITALS — BP 138/70 | HR 72 | Temp 98.6°F | Wt 236.6 lb

## 2012-11-15 DIAGNOSIS — R52 Pain, unspecified: Secondary | ICD-10-CM

## 2012-11-15 DIAGNOSIS — G8929 Other chronic pain: Secondary | ICD-10-CM

## 2012-11-15 DIAGNOSIS — M549 Dorsalgia, unspecified: Secondary | ICD-10-CM

## 2012-11-15 DIAGNOSIS — IMO0002 Reserved for concepts with insufficient information to code with codable children: Secondary | ICD-10-CM

## 2012-11-15 MED ORDER — DIAZEPAM 5 MG PO TABS: 5.0000 mg | ORAL_TABLET | Freq: Four times a day (QID) | ORAL | Status: DC | PRN

## 2012-11-15 MED ORDER — DICLOFENAC SODIUM 1 % TD GEL: 2.0000 g | Freq: Four times a day (QID) | TRANSDERMAL | Status: DC

## 2012-11-15 NOTE — Progress Notes (Signed)
  Subjective:    Patient ID: Ebony Curtis, female    DOB: 03/08/1961, 52 y.o.   MRN: [id number]  HPI Pt here to have disability paperwork filled out.  No new complaints.      Review of Systems As above    Objective:   Physical Exam BP 138/70  Pulse 72  Temp(Src) 98.6 F (37 C) (Oral)  Wt 236 lb 9.6 oz (107.321 kg)  BMI 42.56 kg/m2  SpO2 97% General appearance: alert, cooperative, appears stated age and no distress Throat: lips, mucosa, and tongue normal; teeth and gums normal Neck: no adenopathy, supple, symmetrical, trachea midline and thyroid not enlarged, symmetric, no tenderness/mass/nodules Lungs: clear to auscultation bilaterally Heart: S1, S2 normal Extremities: extremities normal, atraumatic, no cyanosis or edema       Assessment & Plan:

## 2012-11-15 NOTE — Telephone Encounter (Signed)
error 

## 2012-11-15 NOTE — Assessment & Plan Note (Signed)
Cont with exercise at Y con't prn valium and voltaren gel Pt finally got SS disablilty and Medicare----doing much better

## 2013-03-24 ENCOUNTER — Ambulatory Visit (INDEPENDENT_AMBULATORY_CARE_PROVIDER_SITE_OTHER): Payer: Federal, State, Local not specified - PPO | Admitting: Family Medicine

## 2013-03-24 ENCOUNTER — Encounter: Payer: Self-pay | Admitting: Family Medicine

## 2013-03-24 VITALS — BP 130/70 | HR 73 | Temp 98.9°F | Wt 239.6 lb

## 2013-03-24 DIAGNOSIS — IMO0002 Reserved for concepts with insufficient information to code with codable children: Secondary | ICD-10-CM

## 2013-03-24 NOTE — Progress Notes (Signed)
Pre visit review using our clinic review tool, if applicable. No additional management support is needed unless otherwise documented below in the visit note. 

## 2013-03-24 NOTE — Progress Notes (Signed)
   Subjective:    Patient ID: Ebony Curtis, female    DOB: 1960/11/17, 52 y.o.   MRN: [id number]  HPI Pt here to have letter done for workmens comp.  She is still unable to walk or stand for long periods of time.    Review of Systems As  above    Objective:   Physical Exam  BP 130/70  Pulse 73  Temp(Src) 98.9 F (37.2 C) (Oral)  Wt 239 lb 9.6 oz (108.682 kg)  SpO2 97% General appearance: alert, cooperative, appears stated age and no distress Back: low left side back pain Lungs: clear to auscultation bilaterally Heart: S1, S2 normal Extremities: extremities normal, atraumatic, no cyanosis or edema Neurologic: Alert and oriented X 3, normal strength and tone. Normal symmetric reflexes. Normal coordination and gait           Pt c/o numbess and tingling in R leg with sitting or standing for too long.       Assessment & Plan:

## 2013-03-24 NOTE — Patient Instructions (Signed)
Back Pain, Adult Low back pain is very common. About 1 in 5 people have back pain.The cause of low back pain is rarely dangerous. The pain often gets better over time.About half of people with a sudden onset of back pain feel better in just 2 weeks. About 8 in 10 people feel better by 6 weeks.  CAUSES Some common causes of back pain include:  Strain of the muscles or ligaments supporting the spine.  Wear and tear (degeneration) of the spinal discs.  Arthritis.  Direct injury to the back. DIAGNOSIS Most of the time, the direct cause of low back pain is not known.However, back pain can be treated effectively even when the exact cause of the pain is unknown.Answering your caregiver's questions about your overall health and symptoms is one of the most accurate ways to make sure the cause of your pain is not dangerous. If your caregiver needs more information, he or she may order lab work or imaging tests (X-rays or MRIs).However, even if imaging tests show changes in your back, this usually does not require surgery. HOME CARE INSTRUCTIONS For many people, back pain returns.Since low back pain is rarely dangerous, it is often a condition that people can learn to manageon their own.   Remain active. It is stressful on the back to sit or stand in one place. Do not sit, drive, or stand in one place for more than 30 minutes at a time. Take short walks on level surfaces as soon as pain allows.Try to increase the length of time you walk each day.  Do not stay in bed.Resting more than 1 or 2 days can delay your recovery.  Do not avoid exercise or work.Your body is made to move.It is not dangerous to be active, even though your back may hurt.Your back will likely heal faster if you return to being active before your pain is gone.  Pay attention to your body when you bend and lift. Many people have less discomfortwhen lifting if they bend their knees, keep the load close to their bodies,and  avoid twisting. Often, the most comfortable positions are those that put less stress on your recovering back.  Find a comfortable position to sleep. Use a firm mattress and lie on your side with your knees slightly bent. If you lie on your back, put a pillow under your knees.  Only take over-the-counter or prescription medicines as directed by your caregiver. Over-the-counter medicines to reduce pain and inflammation are often the most helpful.Your caregiver may prescribe muscle relaxant drugs.These medicines help dull your pain so you can more quickly return to your normal activities and healthy exercise.  Put ice on the injured area.  Put ice in a plastic bag.  Place a towel between your skin and the bag.  Leave the ice on for 15-20 minutes, 03-04 times a day for the first 2 to 3 days. After that, ice and heat may be alternated to reduce pain and spasms.  Ask your caregiver about trying back exercises and gentle massage. This may be of some benefit.  Avoid feeling anxious or stressed.Stress increases muscle tension and can worsen back pain.It is important to recognize when you are anxious or stressed and learn ways to manage it.Exercise is a great option. SEEK MEDICAL CARE IF:  You have pain that is not relieved with rest or medicine.  You have pain that does not improve in 1 week.  You have new symptoms.  You are generally not feeling well. SEEK   IMMEDIATE MEDICAL CARE IF:   You have pain that radiates from your back into your legs.  You develop new bowel or bladder control problems.  You have unusual weakness or numbness in your arms or legs.  You develop nausea or vomiting.  You develop abdominal pain.  You feel faint. Document Released: 03/23/2005 Document Revised: 09/22/2011 Document Reviewed: 08/11/2010 [employer] Patient Information 2014 [employer].  

## 2013-03-24 NOTE — Assessment & Plan Note (Signed)
WC filled out Con't meds  Pt is disabled

## 2013-08-04 ENCOUNTER — Telehealth: Payer: Self-pay

## 2013-08-04 NOTE — Telephone Encounter (Signed)
Left message for call back Non-identifiable   Pap- 08/07/11- normal MMG- 12/14/11- negative Td- 08/26/09

## 2013-08-07 ENCOUNTER — Encounter: Payer: Federal, State, Local not specified - PPO | Admitting: Family Medicine

## 2013-08-07 DIAGNOSIS — Z0289 Encounter for other administrative examinations: Secondary | ICD-10-CM

## 2013-08-14 NOTE — Telephone Encounter (Signed)
No show

## 2013-10-30 ENCOUNTER — Other Ambulatory Visit: Payer: Self-pay | Admitting: Family Medicine

## 2013-11-19 IMAGING — CR DG CHEST 2V
2 series · 2 of 2 positions shown · non-contrast
Comparison: Chest radiograph 11/05/2008

CLINICAL DATA: Cough.

CHEST - 2 VIEW

[w chest pa]
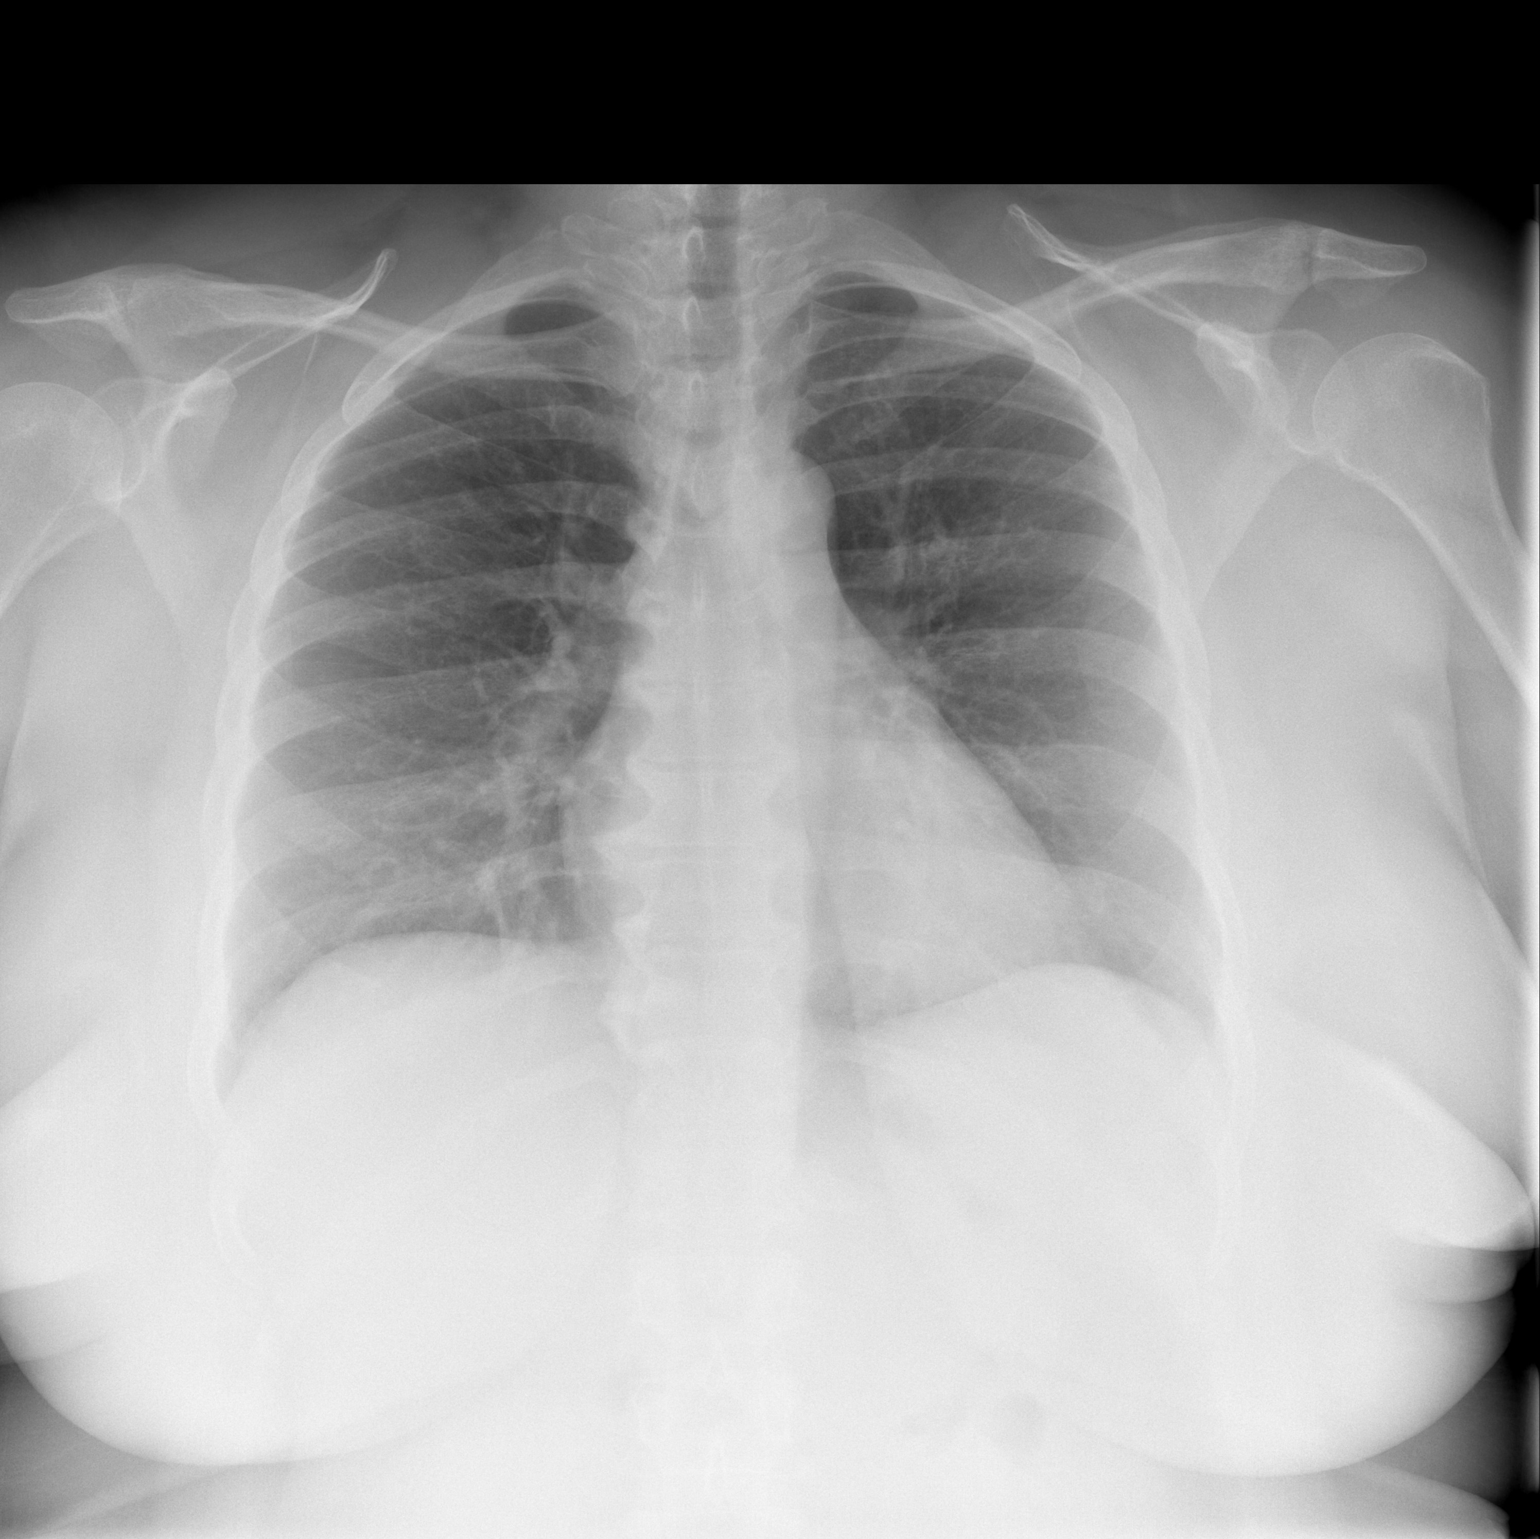

[w chest lat]
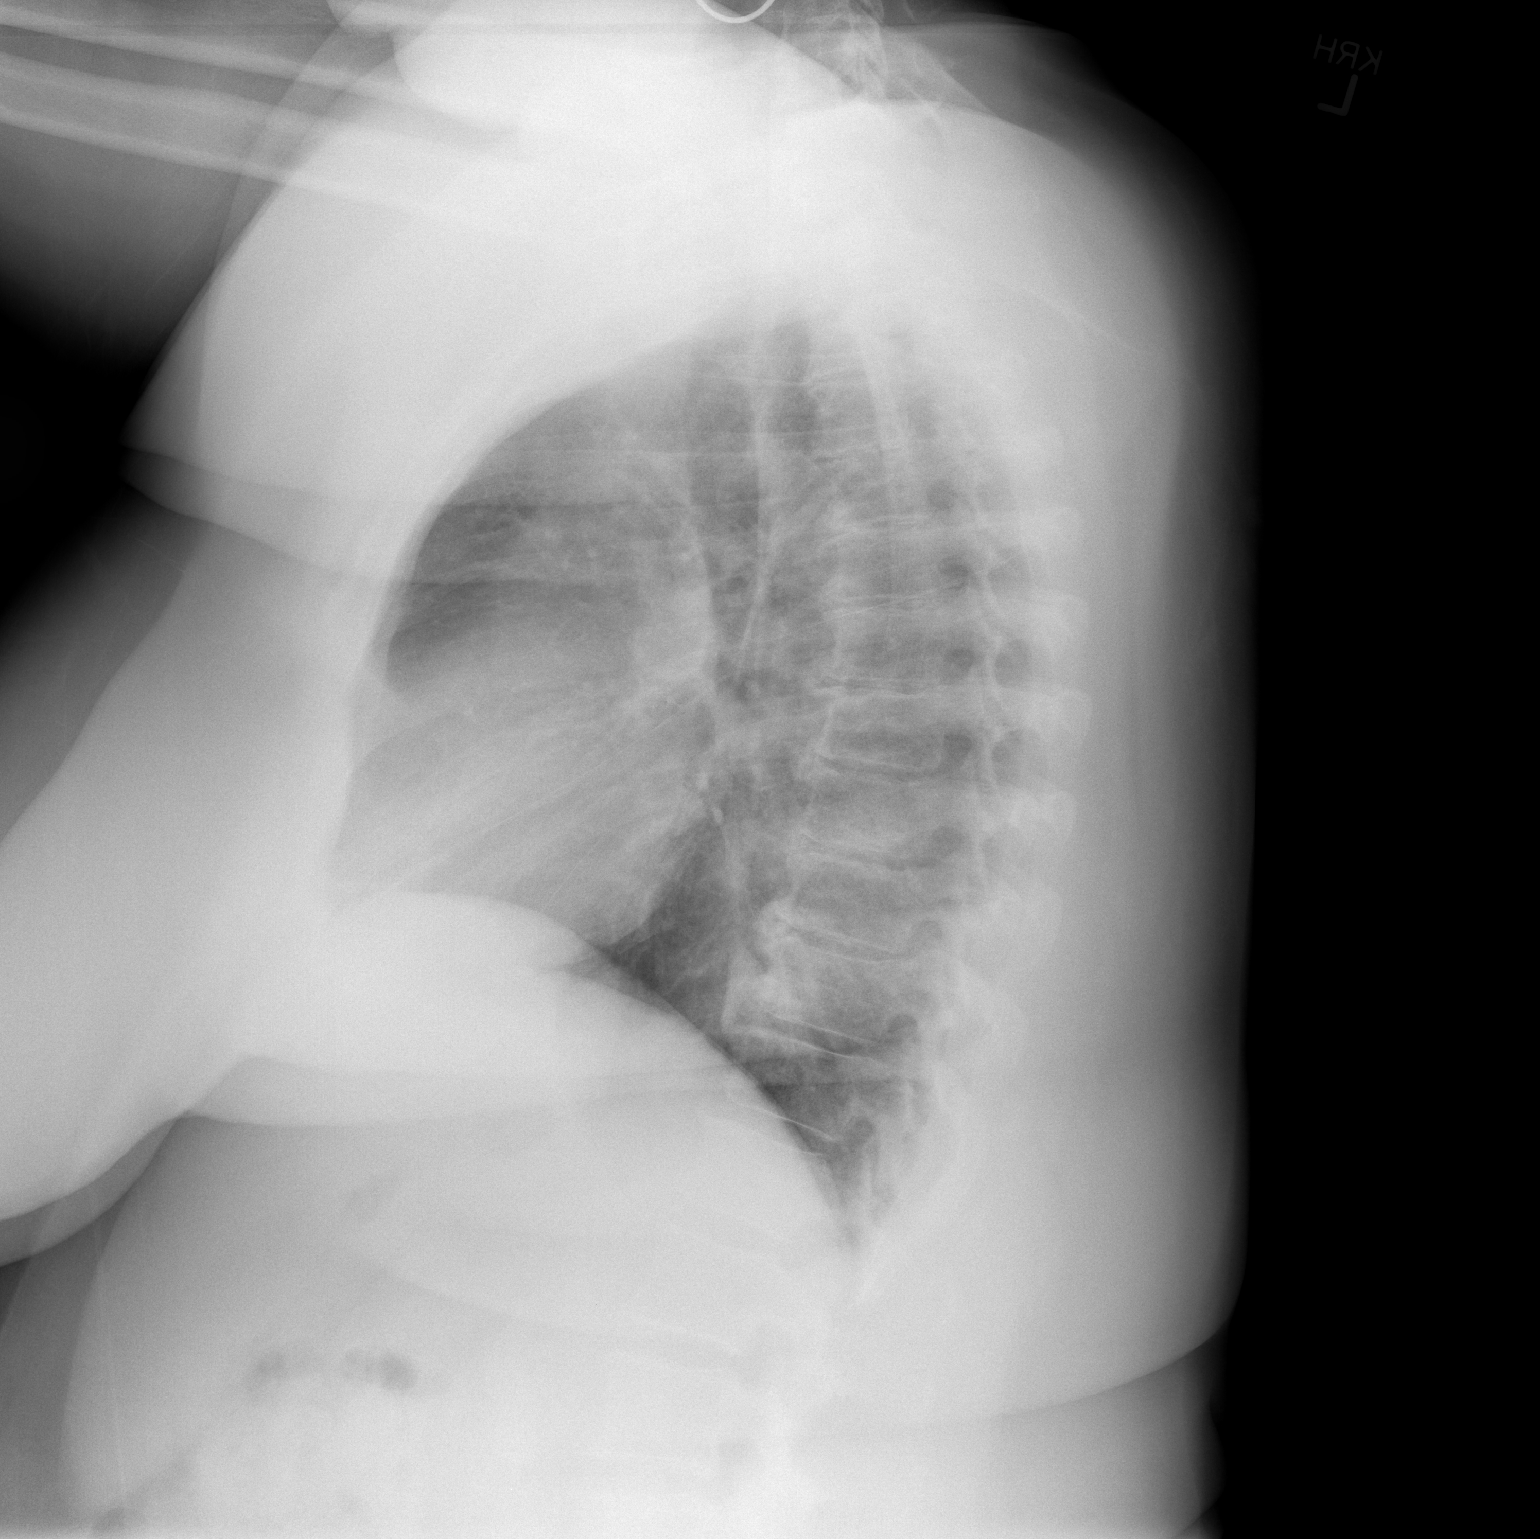

[2 of 2 positions shown; findings below may reference images not displayed]

FINDINGS: The heart, mediastinal, and hilar contours are normal.
Pulmonary vascularity is normal.  Lungs are normally expanded and
clear.  There is no pleural effusion or pneumothorax.  There are
typical degenerative changes of the thoracic spine.  No acute or
suspicious bony abnormality.
IMPRESSION: No acute cardiopulmonary disease.

## 2013-11-27 ENCOUNTER — Encounter: Payer: Federal, State, Local not specified - PPO | Admitting: Family Medicine

## 2013-12-04 ENCOUNTER — Encounter: Payer: Self-pay | Admitting: Family Medicine

## 2013-12-04 ENCOUNTER — Ambulatory Visit (INDEPENDENT_AMBULATORY_CARE_PROVIDER_SITE_OTHER): Payer: Federal, State, Local not specified - PPO | Admitting: Family Medicine

## 2013-12-04 VITALS — BP 128/72 | HR 75 | Temp 98.2°F | Ht 63.0 in | Wt 237.7 lb

## 2013-12-04 DIAGNOSIS — R52 Pain, unspecified: Secondary | ICD-10-CM

## 2013-12-04 DIAGNOSIS — M549 Dorsalgia, unspecified: Secondary | ICD-10-CM

## 2013-12-04 DIAGNOSIS — Z1239 Encounter for other screening for malignant neoplasm of breast: Secondary | ICD-10-CM

## 2013-12-04 DIAGNOSIS — E785 Hyperlipidemia, unspecified: Secondary | ICD-10-CM

## 2013-12-04 DIAGNOSIS — Z0000 Encounter for general adult medical examination without abnormal findings: Secondary | ICD-10-CM

## 2013-12-04 DIAGNOSIS — G8929 Other chronic pain: Secondary | ICD-10-CM

## 2013-12-04 MED ORDER — ALBUTEROL SULFATE HFA 108 (90 BASE) MCG/ACT IN AERS: 2.0000 | INHALATION_SPRAY | Freq: Four times a day (QID) | RESPIRATORY_TRACT | Status: DC | PRN

## 2013-12-04 MED ORDER — DIAZEPAM 5 MG PO TABS: 5.0000 mg | ORAL_TABLET | Freq: Four times a day (QID) | ORAL | Status: DC | PRN

## 2013-12-04 MED ORDER — TRAMADOL HCL 50 MG PO TABS: 50.0000 mg | ORAL_TABLET | Freq: Four times a day (QID) | ORAL | Status: DC | PRN

## 2013-12-04 MED ORDER — DICLOFENAC SODIUM 1 % TD GEL: 2.0000 g | Freq: Four times a day (QID) | TRANSDERMAL | Status: DC

## 2013-12-04 NOTE — Progress Notes (Signed)
Pre visit review using our clinic review tool, if applicable. No additional management support is needed unless otherwise documented below in the visit note. 

## 2013-12-04 NOTE — Progress Notes (Signed)
Subjective:     Ebony Curtis is a 53 y.o. female and is here for a comprehensive physical exam. The patient reports no problems.  History   Social History  . Marital Status: Single    Spouse Name: N/A    Number of Children: N/A  . Years of Education: N/A   Occupational History  . [employer]    disability   Social History Main Topics  . Smoking status: Current Every Day Smoker -- 1.00 packs/day    Types: Cigarettes  . Smokeless tobacco: Not on file  . Alcohol Use: No  . Drug Use: No  . Sexual Activity: Not Currently    Partners: Male   Other Topics Concern  . Not on file   Social History Narrative   Exercise-- walk   Health Maintenance  Topic Date Due  . Colonoscopy  03/29/2010  . Influenza Vaccine  12/05/2014 (Originally 11/04/2013)  . Mammogram  12/13/2013  . Pap Smear  08/07/2014  . Tetanus/tdap  08/27/2019    The following portions of the patient's history were reviewed and updated as appropriate:  She  has a past medical history of GERD (gastroesophageal reflux disease); Hyperlipidemia; Hypertension; Migraines; and Chronic lower back pain. She  does not have any pertinent problems on file. She  has past surgical history that includes Tonsillectomy. Her family history includes Arthritis in her mother; Coronary artery disease in an other family member; Diabetes in her sister; Heart disease in her father, mother, and sister. She  reports that she has been smoking Cigarettes.  She has been smoking about 1.00 pack per day. She does not have any smokeless tobacco history on file. She reports that she does not drink alcohol or use illicit drugs. She has a current medication list which includes the following prescription(s): aspirin, diazepam, diclofenac sodium, esomeprazole, fish oil-omega-3 fatty acids, multivitamin, simvastatin, and tramadol. Current Outpatient Prescriptions on File Prior to Visit  Medication Sig Dispense Refill  . aspirin 81  MG tablet Take 81 mg by mouth daily.      Marland Kitchen esomeprazole (NEXIUM) 40 MG capsule Take 1 capsule (40 mg total) by mouth daily before breakfast.  90 capsule  3  . fish oil-omega-3 fatty acids 1000 MG capsule Take 1 g by mouth daily.        . Multiple Vitamin (MULTIVITAMIN) tablet Take 1 tablet by mouth daily.        . simvastatin (ZOCOR) 40 MG tablet 1 tab by mouth daily--Labs are due now  30 tablet  0  . [DISCONTINUED] solifenacin (VESICARE) 10 MG tablet Take 0.5 tablets (5 mg total) by mouth daily.       No current facility-administered medications on file prior to visit.   She has No Known Allergies..  Review of Systems Review of Systems  Constitutional: Negative for activity change, appetite change and fatigue.  HENT: Negative for hearing loss, congestion, tinnitus and ear discharge.  dentist q16m Eyes: Negative for visual disturbance (see optho q1y -- vision corrected to 20/20 with glasses).  Respiratory: Negative for cough, chest tightness and shortness of breath.   Cardiovascular: Negative for chest pain, palpitations and leg swelling.  Gastrointestinal: Negative for abdominal pain, diarrhea, constipation and abdominal distention.  Genitourinary: Negative for urgency, frequency, decreased urine volume and difficulty urinating.  Musculoskeletal: Negative for back pain, arthralgias and gait problem.  Skin: Negative for color change, pallor and rash.  Neurological: Negative for dizziness, light-headedness, numbness and headaches.  Hematological: Negative for  adenopathy. Does not bruise/bleed easily.  Psychiatric/Behavioral: Negative for suicidal ideas, confusion, sleep disturbance, self-injury, dysphoric mood, decreased concentration and agitation.           Objective:    BP 128/72  Pulse 75  Temp(Src) 98.2 F (36.8 C) (Oral)  Ht 5\' 3"  (1.6 m)  Wt 237 lb 10.5 oz (107.8 kg)  BMI 42.11 kg/m2  SpO2 97% General appearance: alert, cooperative, appears stated age and no  distress Head: Normocephalic, without obvious abnormality, atraumatic Eyes: conjunctivae/corneas clear. PERRL, EOM's intact. Fundi benign. Ears: normal TM's and external ear canals both ears Nose: Nares normal. Septum midline. Mucosa normal. No drainage or sinus tenderness. Throat: lips, mucosa, and tongue normal; teeth and gums normal Neck: no adenopathy, no carotid bruit, no JVD, supple, symmetrical, trachea midline and thyroid not enlarged, symmetric, no tenderness/mass/nodules Back: symmetric, no curvature. ROM normal. No CVA tenderness. Lungs: clear to auscultation bilaterally Breasts: normal appearance, no masses or tenderness Heart: regular rate and rhythm, S1, S2 normal, no murmur, click, rub or gallop Abdomen: soft, non-tender; bowel sounds normal; no masses,  no organomegaly Pelvic: deferred Extremities: extremities normal, atraumatic, no cyanosis or edema Pulses: 2+ and symmetric Skin: Skin color, texture, turgor normal. No rashes or lesions Lymph nodes: Cervical, supraclavicular, and axillary nodes normal. Neurologic: Alert and oriented X 3, normal strength and tone. Normal symmetric reflexes. Normal coordination and gait Psych--no depression , no anxiety      Assessment:    Healthy female exam.     Plan:    ghm utd Check labs  See After Visit Summary for Counseling Recommendations   1. Pain  - diazepam (VALIUM) 5 MG tablet; Take 1 tablet (5 mg total) by mouth every 6 (six) hours as needed.  Dispense: 30 tablet; Refill: 1  2. Back pain, chronic  - traMADol (ULTRAM) 50 MG tablet; Take 1 tablet (50 mg total) by mouth every 6 (six) hours as needed.  Dispense: 30 tablet; Refill: 2 - diclofenac sodium (VOLTAREN) 1 % GEL; Apply 2 g topically 4 (four) times daily. Prn back pain  Dispense: 100 g; Refill: 5  3. Other and unspecified hyperlipidemia  - Hepatic function panel - Lipid panel  4. Preventative health care  - Basic metabolic panel - CBC with Differential -  Hepatic function panel - Lipid panel - POCT urinalysis dipstick - TSH - Ambulatory referral to Gastroenterology  5. Other screening breast examination  - MM DIGITAL SCREENING BILATERAL; Future  Subjective:     Ebony Curtis is a 53 y.o. female and is here for a comprehensive physical exam. The patient reports no problems.  History   Social History  . Marital Status: Single    Spouse Name: N/A    Number of Children: N/A  . Years of Education: N/A   Occupational History  . [employer]    disability   Social History Main Topics  . Smoking status: Current Every Day Smoker -- 1.00 packs/day    Types: Cigarettes  . Smokeless tobacco: Not on file  . Alcohol Use: No  . Drug Use: No  . Sexual Activity: Not Currently    Partners: Male   Other Topics Concern  . Not on file   Social History Narrative   Exercise-- walk   Health Maintenance  Topic Date Due  . Colonoscopy  03/29/2010  . Influenza Vaccine  12/05/2014 (Originally 11/04/2013)  . Mammogram  12/13/2013  . Pap Smear  08/07/2014  . Tetanus/tdap  08/27/2019  The following portions of the patient's history were reviewed and updated as appropriate:  She  has a past medical history of GERD (gastroesophageal reflux disease); Hyperlipidemia; Hypertension; Migraines; and Chronic lower back pain. She  does not have any pertinent problems on file. She  has past surgical history that includes Tonsillectomy. Her family history includes Arthritis in her mother; Coronary artery disease in an other family member; Diabetes in her sister; Heart disease in her father, mother, and sister. She  reports that she has been smoking Cigarettes.  She has been smoking about 1.00 pack per day. She does not have any smokeless tobacco history on file. She reports that she does not drink alcohol or use illicit drugs. She has a current medication list which includes the following prescription(s): aspirin, diazepam,  diclofenac sodium, esomeprazole, fish oil-omega-3 fatty acids, multivitamin, simvastatin, and tramadol. Current Outpatient Prescriptions on File Prior to Visit  Medication Sig Dispense Refill  . aspirin 81 MG tablet Take 81 mg by mouth daily.      Marland Kitchen esomeprazole (NEXIUM) 40 MG capsule Take 1 capsule (40 mg total) by mouth daily before breakfast.  90 capsule  3  . fish oil-omega-3 fatty acids 1000 MG capsule Take 1 g by mouth daily.        . Multiple Vitamin (MULTIVITAMIN) tablet Take 1 tablet by mouth daily.        . simvastatin (ZOCOR) 40 MG tablet 1 tab by mouth daily--Labs are due now  30 tablet  0  . [DISCONTINUED] solifenacin (VESICARE) 10 MG tablet Take 0.5 tablets (5 mg total) by mouth daily.       No current facility-administered medications on file prior to visit.   She has No Known Allergies..  Review of Systems Review of Systems  Constitutional: Negative for activity change, appetite change and fatigue.  HENT: Negative for hearing loss, congestion, tinnitus and ear discharge.  dentist-- no--dentures Eyes: Negative for visual disturbance (see optho q1y -- vision corrected to 20/20 with glasses).  Respiratory: Negative for cough, chest tightness and shortness of breath.   Cardiovascular: Negative for chest pain, palpitations and leg swelling.  Gastrointestinal: Negative for abdominal pain, diarrhea, constipation and abdominal distention.  Genitourinary: Negative for urgency, frequency, decreased urine volume and difficulty urinating.  Musculoskeletal: Negative for back pain, arthralgias and gait problem.  Skin: Negative for color change, pallor and rash.  Neurological: Negative for dizziness, light-headedness, numbness and headaches.  Hematological: Negative for adenopathy. Does not bruise/bleed easily.  Psychiatric/Behavioral: Negative for suicidal ideas, confusion, sleep disturbance, self-injury, dysphoric mood, decreased concentration and agitation.       Objective:     BP 128/72  Pulse 75  Temp(Src) 98.2 F (36.8 C) (Oral)  Ht 5\' 3"  (1.6 m)  Wt 237 lb 10.5 oz (107.8 kg)  BMI 42.11 kg/m2  SpO2 97% General appearance: alert, cooperative, appears stated age and no distress Head: Normocephalic, without obvious abnormality, atraumatic Eyes: conjunctivae/corneas clear. PERRL, EOM's intact. Fundi benign. Ears: normal TM's and external ear canals both ears Nose: Nares normal. Septum midline. Mucosa normal. No drainage or sinus tenderness. Throat: lips, mucosa, and tongue normal; teeth and gums normal Neck: no adenopathy, no carotid bruit, no JVD, supple, symmetrical, trachea midline and thyroid not enlarged, symmetric, no tenderness/mass/nodules Back: symmetric, no curvature. ROM normal. No CVA tenderness. Lungs: clear to auscultation bilaterally Breasts: normal appearance, no masses or tenderness Heart: regular rate and rhythm, S1, S2 normal, no murmur, click, rub or gallop Abdomen: soft, non-tender; bowel sounds normal;  no masses,  no organomegaly Pelvic: deferred Extremities: extremities normal, atraumatic, no cyanosis or edema Pulses: 2+ and symmetric Skin: Skin color, texture, turgor normal. No rashes or lesions Lymph nodes: Cervical, supraclavicular, and axillary nodes normal. Neurologic: Alert and oriented X 3, normal strength and tone. Normal symmetric reflexes. Normal coordination and gait Psych-- no depression, no anxiety       Assessment:    Healthy female exam.      Plan:    ghm -- refer for colon Check labs See After Visit Summary for Counseling Recommendations   1. Pain   - diazepam (VALIUM) 5 MG tablet; Take 1 tablet (5 mg total) by mouth every 6 (six) hours as needed.  Dispense: 30 tablet; Refill: 1  2. Back pain, chronic   - traMADol (ULTRAM) 50 MG tablet; Take 1 tablet (50 mg total) by mouth every 6 (six) hours as needed.  Dispense: 30 tablet; Refill: 2 - diclofenac sodium (VOLTAREN) 1 % GEL; Apply 2 g topically 4 (four)  times daily. Prn back pain  Dispense: 100 g; Refill: 5  3. Other and unspecified hyperlipidemia   - Hepatic function panel - Lipid panel  4. Preventative health care   - Basic metabolic panel - CBC with Differential - Hepatic function panel - Lipid panel - POCT urinalysis dipstick - TSH - Ambulatory referral to Gastroenterology  5. Other screening breast examination    - MM DIGITAL SCREENING BILATERAL; Future

## 2013-12-04 NOTE — Patient Instructions (Signed)
Preventive Care for Adults A healthy lifestyle and preventive care can promote health and wellness. Preventive health guidelines for women include the following key practices.  A routine yearly physical is a good way to check with your health care provider about your health and preventive screening. It is a chance to share any concerns and updates on your health and to receive a thorough exam.  Visit your dentist for a routine exam and preventive care every 6 months. Brush your teeth twice a day and floss once a day. Good oral hygiene prevents tooth decay and gum disease.  The frequency of eye exams is based on your age, health, family medical history, use of contact lenses, and other factors. Follow your health care provider's recommendations for frequency of eye exams.  Eat a healthy diet. Foods like vegetables, fruits, whole grains, low-fat dairy products, and lean protein foods contain the nutrients you need without too many calories. Decrease your intake of foods high in solid fats, added sugars, and salt. Eat the right amount of calories for you.Get information about a proper diet from your health care provider, if necessary.  Regular physical exercise is one of the most important things you can do for your health. Most adults should get at least 150 minutes of moderate-intensity exercise (any activity that increases your heart rate and causes you to sweat) each week. In addition, most adults need muscle-strengthening exercises on 2 or more days a week.  Maintain a healthy weight. The body mass index (BMI) is a screening tool to identify possible weight problems. It provides an estimate of body fat based on height and weight. Your health care provider can find your BMI and can help you achieve or maintain a healthy weight.For adults 20 years and older:  A BMI below 18.5 is considered underweight.  A BMI of 18.5 to 24.9 is normal.  A BMI of 25 to 29.9 is considered overweight.  A BMI of  30 and above is considered obese.  Maintain normal blood lipids and cholesterol levels by exercising and minimizing your intake of saturated fat. Eat a balanced diet with plenty of fruit and vegetables. Blood tests for lipids and cholesterol should begin at age 76 and be repeated every 5 years. If your lipid or cholesterol levels are high, you are over 50, or you are at high risk for heart disease, you may need your cholesterol levels checked more frequently.Ongoing high lipid and cholesterol levels should be treated with medicines if diet and exercise are not working.  If you smoke, find out from your health care provider how to quit. If you do not use tobacco, do not start.  Lung cancer screening is recommended for adults aged 22-80 years who are at high risk for developing lung cancer because of a history of smoking. A yearly low-dose CT scan of the lungs is recommended for people who have at least a 30-pack-year history of smoking and are a current smoker or have quit within the past 15 years. A pack year of smoking is smoking an average of 1 pack of cigarettes a day for 1 year (for example: 1 pack a day for 30 years or 2 packs a day for 15 years). Yearly screening should continue until the smoker has stopped smoking for at least 15 years. Yearly screening should be stopped for people who develop a health problem that would prevent them from having lung cancer treatment.  If you are pregnant, do not drink alcohol. If you are breastfeeding,  be very cautious about drinking alcohol. If you are not pregnant and choose to drink alcohol, do not have more than 1 drink per day. One drink is considered to be 12 ounces (355 mL) of beer, 5 ounces (148 mL) of wine, or 1.5 ounces (44 mL) of liquor.  Avoid use of street drugs. Do not share needles with anyone. Ask for help if you need support or instructions about stopping the use of drugs.  High blood pressure causes heart disease and increases the risk of  stroke. Your blood pressure should be checked at least every 1 to 2 years. Ongoing high blood pressure should be treated with medicines if weight loss and exercise do not work.  If you are 75-52 years old, ask your health care provider if you should take aspirin to prevent strokes.  Diabetes screening involves taking a blood sample to check your fasting blood sugar level. This should be done once every 3 years, after age 15, if you are within normal weight and without risk factors for diabetes. Testing should be considered at a younger age or be carried out more frequently if you are overweight and have at least 1 risk factor for diabetes.  Breast cancer screening is essential preventive care for women. You should practice "breast self-awareness." This means understanding the normal appearance and feel of your breasts and may include breast self-examination. Any changes detected, no matter how small, should be reported to a health care provider. Women in their 58s and 30s should have a clinical breast exam (CBE) by a health care provider as part of a regular health exam every 1 to 3 years. After age 16, women should have a CBE every year. Starting at age 53, women should consider having a mammogram (breast X-ray test) every year. Women who have a family history of breast cancer should talk to their health care provider about genetic screening. Women at a high risk of breast cancer should talk to their health care providers about having an MRI and a mammogram every year.  Breast cancer gene (BRCA)-related cancer risk assessment is recommended for women who have family members with BRCA-related cancers. BRCA-related cancers include breast, ovarian, tubal, and peritoneal cancers. Having family members with these cancers may be associated with an increased risk for harmful changes (mutations) in the breast cancer genes BRCA1 and BRCA2. Results of the assessment will determine the need for genetic counseling and  BRCA1 and BRCA2 testing.  Routine pelvic exams to screen for cancer are no longer recommended for nonpregnant women who are considered low risk for cancer of the pelvic organs (ovaries, uterus, and vagina) and who do not have symptoms. Ask your health care provider if a screening pelvic exam is right for you.  If you have had past treatment for cervical cancer or a condition that could lead to cancer, you need Pap tests and screening for cancer for at least 20 years after your treatment. If Pap tests have been discontinued, your risk factors (such as having a new sexual partner) need to be reassessed to determine if screening should be resumed. Some women have medical problems that increase the chance of getting cervical cancer. In these cases, your health care provider may recommend more frequent screening and Pap tests.  The HPV test is an additional test that may be used for cervical cancer screening. The HPV test looks for the virus that can cause the cell changes on the cervix. The cells collected during the Pap test can be  tested for HPV. The HPV test could be used to screen women aged 30 years and older, and should be used in women of any age who have unclear Pap test results. After the age of 30, women should have HPV testing at the same frequency as a Pap test.  Colorectal cancer can be detected and often prevented. Most routine colorectal cancer screening begins at the age of 50 years and continues through age 75 years. However, your health care provider may recommend screening at an earlier age if you have risk factors for colon cancer. On a yearly basis, your health care provider may provide home test kits to check for hidden blood in the stool. Use of a small camera at the end of a tube, to directly examine the colon (sigmoidoscopy or colonoscopy), can detect the earliest forms of colorectal cancer. Talk to your health care provider about this at age 50, when routine screening begins. Direct  exam of the colon should be repeated every 5-10 years through age 75 years, unless early forms of pre-cancerous polyps or small growths are found.  People who are at an increased risk for hepatitis B should be screened for this virus. You are considered at high risk for hepatitis B if:  You were born in a country where hepatitis B occurs often. Talk with your health care provider about which countries are considered high risk.  Your parents were born in a high-risk country and you have not received a shot to protect against hepatitis B (hepatitis B vaccine).  You have HIV or AIDS.  You use needles to inject street drugs.  You live with, or have sex with, someone who has hepatitis B.  You get hemodialysis treatment.  You take certain medicines for conditions like cancer, organ transplantation, and autoimmune conditions.  Hepatitis C blood testing is recommended for all people born from 1945 through 1965 and any individual with known risks for hepatitis C.  Practice safe sex. Use condoms and avoid high-risk sexual practices to reduce the spread of sexually transmitted infections (STIs). STIs include gonorrhea, chlamydia, syphilis, trichomonas, herpes, HPV, and human immunodeficiency virus (HIV). Herpes, HIV, and HPV are viral illnesses that have no cure. They can result in disability, cancer, and death.  You should be screened for sexually transmitted illnesses (STIs) including gonorrhea and chlamydia if:  You are sexually active and are younger than 24 years.  You are older than 24 years and your health care provider tells you that you are at risk for this type of infection.  Your sexual activity has changed since you were last screened and you are at an increased risk for chlamydia or gonorrhea. Ask your health care provider if you are at risk.  If you are at risk of being infected with HIV, it is recommended that you take a prescription medicine daily to prevent HIV infection. This is  called preexposure prophylaxis (PrEP). You are considered at risk if:  You are a heterosexual woman, are sexually active, and are at increased risk for HIV infection.  You take drugs by injection.  You are sexually active with a partner who has HIV.  Talk with your health care provider about whether you are at high risk of being infected with HIV. If you choose to begin PrEP, you should first be tested for HIV. You should then be tested every 3 months for as long as you are taking PrEP.  Osteoporosis is a disease in which the bones lose minerals and strength   with aging. This can result in serious bone fractures or breaks. The risk of osteoporosis can be identified using a bone density scan. Women ages 65 years and over and women at risk for fractures or osteoporosis should discuss screening with their health care providers. Ask your health care provider whether you should take a calcium supplement or vitamin D to reduce the rate of osteoporosis.  Menopause can be associated with physical symptoms and risks. Hormone replacement therapy is available to decrease symptoms and risks. You should talk to your health care provider about whether hormone replacement therapy is right for you.  Use sunscreen. Apply sunscreen liberally and repeatedly throughout the day. You should seek shade when your shadow is shorter than you. Protect yourself by wearing long sleeves, pants, a wide-brimmed hat, and sunglasses year round, whenever you are outdoors.  Once a month, do a whole body skin exam, using a mirror to look at the skin on your back. Tell your health care provider of new moles, moles that have irregular borders, moles that are larger than a pencil eraser, or moles that have changed in shape or color.  Stay current with required vaccines (immunizations).  Influenza vaccine. All adults should be immunized every year.  Tetanus, diphtheria, and acellular pertussis (Td, Tdap) vaccine. Pregnant women should  receive 1 dose of Tdap vaccine during each pregnancy. The dose should be obtained regardless of the length of time since the last dose. Immunization is preferred during the 27th-36th week of gestation. An adult who has not previously received Tdap or who does not know her vaccine status should receive 1 dose of Tdap. This initial dose should be followed by tetanus and diphtheria toxoids (Td) booster doses every 10 years. Adults with an unknown or incomplete history of completing a 3-dose immunization series with Td-containing vaccines should begin or complete a primary immunization series including a Tdap dose. Adults should receive a Td booster every 10 years.  Varicella vaccine. An adult without evidence of immunity to varicella should receive 2 doses or a second dose if she has previously received 1 dose. Pregnant females who do not have evidence of immunity should receive the first dose after pregnancy. This first dose should be obtained before leaving the health care facility. The second dose should be obtained 4-8 weeks after the first dose.  Human papillomavirus (HPV) vaccine. Females aged 13-26 years who have not received the vaccine previously should obtain the 3-dose series. The vaccine is not recommended for use in pregnant females. However, pregnancy testing is not needed before receiving a dose. If a female is found to be pregnant after receiving a dose, no treatment is needed. In that case, the remaining doses should be delayed until after the pregnancy. Immunization is recommended for any person with an immunocompromised condition through the age of 26 years if she did not get any or all doses earlier. During the 3-dose series, the second dose should be obtained 4-8 weeks after the first dose. The third dose should be obtained 24 weeks after the first dose and 16 weeks after the second dose.  Zoster vaccine. One dose is recommended for adults aged 60 years or older unless certain conditions are  present.  Measles, mumps, and rubella (MMR) vaccine. Adults born before 1957 generally are considered immune to measles and mumps. Adults born in 1957 or later should have 1 or more doses of MMR vaccine unless there is a contraindication to the vaccine or there is laboratory evidence of immunity to   each of the three diseases. A routine second dose of MMR vaccine should be obtained at least 28 days after the first dose for students attending postsecondary schools, health care workers, or international travelers. People who received inactivated measles vaccine or an unknown type of measles vaccine during 1963-1967 should receive 2 doses of MMR vaccine. People who received inactivated mumps vaccine or an unknown type of mumps vaccine before 1979 and are at high risk for mumps infection should consider immunization with 2 doses of MMR vaccine. For females of childbearing age, rubella immunity should be determined. If there is no evidence of immunity, females who are not pregnant should be vaccinated. If there is no evidence of immunity, females who are pregnant should delay immunization until after pregnancy. Unvaccinated health care workers born before 1957 who lack laboratory evidence of measles, mumps, or rubella immunity or laboratory confirmation of disease should consider measles and mumps immunization with 2 doses of MMR vaccine or rubella immunization with 1 dose of MMR vaccine.  Pneumococcal 13-valent conjugate (PCV13) vaccine. When indicated, a person who is uncertain of her immunization history and has no record of immunization should receive the PCV13 vaccine. An adult aged 19 years or older who has certain medical conditions and has not been previously immunized should receive 1 dose of PCV13 vaccine. This PCV13 should be followed with a dose of pneumococcal polysaccharide (PPSV23) vaccine. The PPSV23 vaccine dose should be obtained at least 8 weeks after the dose of PCV13 vaccine. An adult aged 19  years or older who has certain medical conditions and previously received 1 or more doses of PPSV23 vaccine should receive 1 dose of PCV13. The PCV13 vaccine dose should be obtained 1 or more years after the last PPSV23 vaccine dose.  Pneumococcal polysaccharide (PPSV23) vaccine. When PCV13 is also indicated, PCV13 should be obtained first. All adults aged 65 years and older should be immunized. An adult younger than age 65 years who has certain medical conditions should be immunized. Any person who resides in a nursing home or long-term care facility should be immunized. An adult smoker should be immunized. People with an immunocompromised condition and certain other conditions should receive both PCV13 and PPSV23 vaccines. People with human immunodeficiency virus (HIV) infection should be immunized as soon as possible after diagnosis. Immunization during chemotherapy or radiation therapy should be avoided. Routine use of PPSV23 vaccine is not recommended for American Indians, [place] Natives, or people younger than 65 years unless there are medical conditions that require PPSV23 vaccine. When indicated, people who have unknown immunization and have no record of immunization should receive PPSV23 vaccine. One-time revaccination 5 years after the first dose of PPSV23 is recommended for people aged 19-64 years who have chronic kidney failure, nephrotic syndrome, asplenia, or immunocompromised conditions. People who received 1-2 doses of PPSV23 before age 65 years should receive another dose of PPSV23 vaccine at age 65 years or later if at least 5 years have passed since the previous dose. Doses of PPSV23 are not needed for people immunized with PPSV23 at or after age 65 years.  Meningococcal vaccine. Adults with asplenia or persistent complement component deficiencies should receive 2 doses of quadrivalent meningococcal conjugate (MenACWY-D) vaccine. The doses should be obtained at least 2 months apart.  Microbiologists working with certain meningococcal bacteria, military recruits, people at risk during an outbreak, and people who travel to or live in countries with a high rate of meningitis should be immunized. A first-year college student up through age   21 years who is living in a residence hall should receive a dose if she did not receive a dose on or after her 16th birthday. Adults who have certain high-risk conditions should receive one or more doses of vaccine.  Hepatitis A vaccine. Adults who wish to be protected from this disease, have certain high-risk conditions, work with hepatitis A-infected animals, work in hepatitis A research labs, or travel to or work in countries with a high rate of hepatitis A should be immunized. Adults who were previously unvaccinated and who anticipate close contact with an international adoptee during the first 60 days after arrival in the [place] from a country with a high rate of hepatitis A should be immunized.  Hepatitis B vaccine. Adults who wish to be protected from this disease, have certain high-risk conditions, may be exposed to blood or other infectious body fluids, are household contacts or sex partners of hepatitis B positive people, are clients or workers in certain care facilities, or travel to or work in countries with a high rate of hepatitis B should be immunized.  Haemophilus influenzae type b (Hib) vaccine. A previously unvaccinated person with asplenia or sickle cell disease or having a scheduled splenectomy should receive 1 dose of Hib vaccine. Regardless of previous immunization, a recipient of a hematopoietic stem cell transplant should receive a 3-dose series 6-12 months after her successful transplant. Hib vaccine is not recommended for adults with HIV infection. Preventive Services / Frequency Ages 64 to 68 years  Blood pressure check.** / Every 1 to 2 years.  Lipid and cholesterol check.** / Every 5 years beginning at age  22.  Clinical breast exam.** / Every 3 years for women in their 88s and 53s.  BRCA-related cancer risk assessment.** / For women who have family members with a BRCA-related cancer (breast, ovarian, tubal, or peritoneal cancers).  Pap test.** / Every 2 years from ages 90 through 51. Every 3 years starting at age 21 through age 56 or 3 with a history of 3 consecutive normal Pap tests.  HPV screening.** / Every 3 years from ages 24 through ages 1 to 46 with a history of 3 consecutive normal Pap tests.  Hepatitis C blood test.** / For any individual with known risks for hepatitis C.  Skin self-exam. / Monthly.  Influenza vaccine. / Every year.  Tetanus, diphtheria, and acellular pertussis (Tdap, Td) vaccine.** / Consult your health care provider. Pregnant women should receive 1 dose of Tdap vaccine during each pregnancy. 1 dose of Td every 10 years.  Varicella vaccine.** / Consult your health care provider. Pregnant females who do not have evidence of immunity should receive the first dose after pregnancy.  HPV vaccine. / 3 doses over 6 months, if 72 and younger. The vaccine is not recommended for use in pregnant females. However, pregnancy testing is not needed before receiving a dose.  Measles, mumps, rubella (MMR) vaccine.** / You need at least 1 dose of MMR if you were born in 1957 or later. You may also need a 2nd dose. For females of childbearing age, rubella immunity should be determined. If there is no evidence of immunity, females who are not pregnant should be vaccinated. If there is no evidence of immunity, females who are pregnant should delay immunization until after pregnancy.  Pneumococcal 13-valent conjugate (PCV13) vaccine.** / Consult your health care provider.  Pneumococcal polysaccharide (PPSV23) vaccine.** / 1 to 2 doses if you smoke cigarettes or if you have certain conditions.  Meningococcal vaccine.** /  1 dose if you are age 19 to 21 years and a first-year college  student living in a residence hall, or have one of several medical conditions, you need to get vaccinated against meningococcal disease. You may also need additional booster doses.  Hepatitis A vaccine.** / Consult your health care provider.  Hepatitis B vaccine.** / Consult your health care provider.  Haemophilus influenzae type b (Hib) vaccine.** / Consult your health care provider. Ages 40 to 64 years  Blood pressure check.** / Every 1 to 2 years.  Lipid and cholesterol check.** / Every 5 years beginning at age 20 years.  Lung cancer screening. / Every year if you are aged 55-80 years and have a 30-pack-year history of smoking and currently smoke or have quit within the past 15 years. Yearly screening is stopped once you have quit smoking for at least 15 years or develop a health problem that would prevent you from having lung cancer treatment.  Clinical breast exam.** / Every year after age 40 years.  BRCA-related cancer risk assessment.** / For women who have family members with a BRCA-related cancer (breast, ovarian, tubal, or peritoneal cancers).  Mammogram.** / Every year beginning at age 40 years and continuing for as long as you are in good health. Consult with your health care provider.  Pap test.** / Every 3 years starting at age 30 years through age 65 or 70 years with a history of 3 consecutive normal Pap tests.  HPV screening.** / Every 3 years from ages 30 years through ages 65 to 70 years with a history of 3 consecutive normal Pap tests.  Fecal occult blood test (FOBT) of stool. / Every year beginning at age 50 years and continuing until age 75 years. You may not need to do this test if you get a colonoscopy every 10 years.  Flexible sigmoidoscopy or colonoscopy.** / Every 5 years for a flexible sigmoidoscopy or every 10 years for a colonoscopy beginning at age 50 years and continuing until age 75 years.  Hepatitis C blood test.** / For all people born from 1945 through  1965 and any individual with known risks for hepatitis C.  Skin self-exam. / Monthly.  Influenza vaccine. / Every year.  Tetanus, diphtheria, and acellular pertussis (Tdap/Td) vaccine.** / Consult your health care provider. Pregnant women should receive 1 dose of Tdap vaccine during each pregnancy. 1 dose of Td every 10 years.  Varicella vaccine.** / Consult your health care provider. Pregnant females who do not have evidence of immunity should receive the first dose after pregnancy.  Zoster vaccine.** / 1 dose for adults aged 60 years or older.  Measles, mumps, rubella (MMR) vaccine.** / You need at least 1 dose of MMR if you were born in 1957 or later. You may also need a 2nd dose. For females of childbearing age, rubella immunity should be determined. If there is no evidence of immunity, females who are not pregnant should be vaccinated. If there is no evidence of immunity, females who are pregnant should delay immunization until after pregnancy.  Pneumococcal 13-valent conjugate (PCV13) vaccine.** / Consult your health care provider.  Pneumococcal polysaccharide (PPSV23) vaccine.** / 1 to 2 doses if you smoke cigarettes or if you have certain conditions.  Meningococcal vaccine.** / Consult your health care provider.  Hepatitis A vaccine.** / Consult your health care provider.  Hepatitis B vaccine.** / Consult your health care provider.  Haemophilus influenzae type b (Hib) vaccine.** / Consult your health care provider. Ages 65   years and over  Blood pressure check.** / Every 1 to 2 years.  Lipid and cholesterol check.** / Every 5 years beginning at age 22 years.  Lung cancer screening. / Every year if you are aged 73-80 years and have a 30-pack-year history of smoking and currently smoke or have quit within the past 15 years. Yearly screening is stopped once you have quit smoking for at least 15 years or develop a health problem that would prevent you from having lung cancer  treatment.  Clinical breast exam.** / Every year after age 4 years.  BRCA-related cancer risk assessment.** / For women who have family members with a BRCA-related cancer (breast, ovarian, tubal, or peritoneal cancers).  Mammogram.** / Every year beginning at age 40 years and continuing for as long as you are in good health. Consult with your health care provider.  Pap test.** / Every 3 years starting at age 9 years through age 34 or 91 years with 3 consecutive normal Pap tests. Testing can be stopped between 65 and 70 years with 3 consecutive normal Pap tests and no abnormal Pap or HPV tests in the past 10 years.  HPV screening.** / Every 3 years from ages 57 years through ages 64 or 45 years with a history of 3 consecutive normal Pap tests. Testing can be stopped between 65 and 70 years with 3 consecutive normal Pap tests and no abnormal Pap or HPV tests in the past 10 years.  Fecal occult blood test (FOBT) of stool. / Every year beginning at age 15 years and continuing until age 17 years. You may not need to do this test if you get a colonoscopy every 10 years.  Flexible sigmoidoscopy or colonoscopy.** / Every 5 years for a flexible sigmoidoscopy or every 10 years for a colonoscopy beginning at age 86 years and continuing until age 71 years.  Hepatitis C blood test.** / For all people born from [birth year] through 1966 and any individual with known risks for hepatitis C.  Osteoporosis screening.** / A one-time screening for women ages 83 years and over and women at risk for fractures or osteoporosis.  Skin self-exam. / Monthly.  Influenza vaccine. / Every year.  Tetanus, diphtheria, and acellular pertussis (Tdap/Td) vaccine.** / 1 dose of Td every 10 years.  Varicella vaccine.** / Consult your health care provider.  Zoster vaccine.** / 1 dose for adults aged 61 years or older.  Pneumococcal 13-valent conjugate (PCV13) vaccine.** / Consult your health care provider.  Pneumococcal  polysaccharide (PPSV23) vaccine.** / 1 dose for all adults aged 28 years and older.  Meningococcal vaccine.** / Consult your health care provider.  Hepatitis A vaccine.** / Consult your health care provider.  Hepatitis B vaccine.** / Consult your health care provider.  Haemophilus influenzae type b (Hib) vaccine.** / Consult your health care provider. ** Family history and personal history of risk and conditions may change your health care provider's recommendations. Document Released: 05/19/2001 Document Revised: 08/07/2013 Document Reviewed: 08/18/2010 [facility] Patient Information 2015 [place], [place]. This information is not intended to replace advice given to you by your health care provider. Make sure you discuss any questions you have with your health care provider.

## 2013-12-05 LAB — HEPATIC FUNCTION PANEL
ALBUMIN: 3.8 g/dL (ref 3.5–5.2)
ALT: 16 U/L (ref 0–35)
AST: 21 U/L (ref 0–37)
Alkaline Phosphatase: 68 U/L (ref 39–117)
Bilirubin, Direct: 0 mg/dL (ref 0.0–0.3)
TOTAL PROTEIN: 7.6 g/dL (ref 6.0–8.3)
Total Bilirubin: 0.4 mg/dL (ref 0.2–1.2)

## 2013-12-05 LAB — CBC WITH DIFFERENTIAL/PLATELET
BASOS PCT: 1.5 % (ref 0.0–3.0)
Basophils Absolute: 0.1 10*3/uL (ref 0.0–0.1)
EOS ABS: 0.1 10*3/uL (ref 0.0–0.7)
EOS PCT: 0.9 % (ref 0.0–5.0)
HEMATOCRIT: 45.3 % (ref 36.0–46.0)
HEMOGLOBIN: 14.8 g/dL (ref 12.0–15.0)
LYMPHS ABS: 5.4 10*3/uL — AB (ref 0.7–4.0)
Lymphocytes Relative: 53.2 % — ABNORMAL HIGH (ref 12.0–46.0)
MCHC: 32.6 g/dL (ref 30.0–36.0)
MCV: 93.4 fl (ref 78.0–100.0)
MONO ABS: 0.4 10*3/uL (ref 0.1–1.0)
Monocytes Relative: 3.9 % (ref 3.0–12.0)
NEUTROS ABS: 4.1 10*3/uL (ref 1.4–7.7)
NEUTROS PCT: 40.5 % — AB (ref 43.0–77.0)
Platelets: 302 10*3/uL (ref 150.0–400.0)
RBC: 4.84 Mil/uL (ref 3.87–5.11)
RDW: 14.4 % (ref 11.5–15.5)
WBC: 10.1 10*3/uL (ref 4.0–10.5)

## 2013-12-05 LAB — LIPID PANEL
CHOL/HDL RATIO: 4
CHOLESTEROL: 159 mg/dL (ref 0–200)
HDL: 40.4 mg/dL (ref >39.00)
LDL Cholesterol: 100 mg/dL — ABNORMAL HIGH (ref 0–99)
NonHDL: 118.6
TRIGLYCERIDES: 93 mg/dL (ref 0.0–149.0)
VLDL: 18.6 mg/dL (ref 0.0–40.0)

## 2013-12-05 LAB — BASIC METABOLIC PANEL
BUN: 11 mg/dL (ref 6–23)
CHLORIDE: 104 meq/L (ref 96–112)
CO2: 29 meq/L (ref 19–32)
CREATININE: 0.9 mg/dL (ref 0.4–1.2)
Calcium: 9.3 mg/dL (ref 8.4–10.5)
GFR: 81.91 mL/min (ref >60.00)
Glucose, Bld: 88 mg/dL (ref 70–99)
POTASSIUM: 4.5 meq/L (ref 3.5–5.1)
Sodium: 139 mEq/L (ref 135–145)

## 2013-12-05 LAB — POCT URINALYSIS DIPSTICK
BILIRUBIN UA: NEGATIVE
GLUCOSE UA: NEGATIVE
Ketones, UA: NEGATIVE
Leukocytes, UA: NEGATIVE
NITRITE UA: NEGATIVE
Protein, UA: NEGATIVE
RBC UA: NEGATIVE
SPEC GRAV UA: 1.025
UROBILINOGEN UA: 0.2
pH, UA: 5

## 2013-12-05 LAB — TSH: TSH: 1.06 u[IU]/mL (ref 0.35–4.50)

## 2013-12-06 ENCOUNTER — Other Ambulatory Visit: Payer: Self-pay | Admitting: Family Medicine

## 2013-12-06 NOTE — Telephone Encounter (Signed)
Last OV:  12/04/13 Labs: 12/04/13  Medication refilled x 6 months per Prescription Refill Protocol.

## 2013-12-15 ENCOUNTER — Ambulatory Visit (HOSPITAL_BASED_OUTPATIENT_CLINIC_OR_DEPARTMENT_OTHER): Payer: Federal, State, Local not specified - PPO

## 2013-12-18 ENCOUNTER — Ambulatory Visit (HOSPITAL_BASED_OUTPATIENT_CLINIC_OR_DEPARTMENT_OTHER)
Admission: RE | Admit: 2013-12-18 | Discharge: 2013-12-18 | Disposition: A | Discharge status: Home / Self Care | Payer: Federal, State, Local not specified - PPO | Source: Ambulatory Visit | Attending: Family Medicine | Admitting: Family Medicine

## 2013-12-18 DIAGNOSIS — Z1231 Encounter for screening mammogram for malignant neoplasm of breast: Secondary | ICD-10-CM | POA: Diagnosis not present

## 2013-12-18 DIAGNOSIS — Z1239 Encounter for other screening for malignant neoplasm of breast: Secondary | ICD-10-CM

## 2014-10-15 ENCOUNTER — Other Ambulatory Visit: Payer: Self-pay | Admitting: Family Medicine

## 2014-10-15 ENCOUNTER — Telehealth: Payer: Self-pay | Admitting: Family Medicine

## 2014-10-15 NOTE — Telephone Encounter (Signed)
Caller name: Rifka Relation to pt: self Call back number: [phone] Pharmacy:  Reason for call:   Requesting a callback. This is reqarding compensation case.

## 2014-10-16 NOTE — Telephone Encounter (Signed)
Called and lm for pt to please return call. I need more information as to exactly what pt needs. JG//CMA

## 2015-01-13 ENCOUNTER — Other Ambulatory Visit: Payer: Self-pay | Admitting: Family Medicine

## 2015-03-03 ENCOUNTER — Other Ambulatory Visit: Payer: Self-pay | Admitting: Family Medicine

## 2015-03-06 ENCOUNTER — Other Ambulatory Visit: Payer: Self-pay | Admitting: Family Medicine

## 2015-03-07 NOTE — Telephone Encounter (Signed)
Advised patient two medications she needed refills on required office visit first. Left message on answering machine.

## 2015-03-18 ENCOUNTER — Encounter: Payer: Self-pay | Admitting: Family Medicine

## 2015-03-18 ENCOUNTER — Ambulatory Visit (INDEPENDENT_AMBULATORY_CARE_PROVIDER_SITE_OTHER): Payer: Federal, State, Local not specified - PPO | Admitting: Family Medicine

## 2015-03-18 VITALS — BP 132/82 | HR 91 | Temp 98.6°F | Wt 241.8 lb

## 2015-03-18 DIAGNOSIS — M549 Dorsalgia, unspecified: Secondary | ICD-10-CM | POA: Diagnosis not present

## 2015-03-18 DIAGNOSIS — G8929 Other chronic pain: Secondary | ICD-10-CM

## 2015-03-18 DIAGNOSIS — J208 Acute bronchitis due to other specified organisms: Secondary | ICD-10-CM | POA: Diagnosis not present

## 2015-03-18 MED ORDER — HYDROCOD POLST-CPM POLST ER 10-8 MG/5ML PO SUER: 5.0000 mL | Freq: Two times a day (BID) | ORAL | Status: DC | PRN

## 2015-03-18 MED ORDER — DICLOFENAC SODIUM 1 % TD GEL: 2.0000 g | Freq: Four times a day (QID) | TRANSDERMAL | Status: DC

## 2015-03-18 MED ORDER — ALBUTEROL SULFATE 108 (90 BASE) MCG/ACT IN AEPB: 1.0000 | INHALATION_SPRAY | Freq: Four times a day (QID) | RESPIRATORY_TRACT | Status: DC | PRN

## 2015-03-18 MED ORDER — TRAMADOL HCL 50 MG PO TABS: 50.0000 mg | ORAL_TABLET | Freq: Four times a day (QID) | ORAL | Status: DC | PRN

## 2015-03-18 MED ORDER — AZITHROMYCIN 250 MG PO TABS: ORAL_TABLET | ORAL | Status: DC

## 2015-03-18 NOTE — Patient Instructions (Signed)

## 2015-03-18 NOTE — Progress Notes (Signed)
Pre visit review using our clinic review tool, if applicable. No additional management support is needed unless otherwise documented below in the visit note. 

## 2015-03-18 NOTE — Progress Notes (Signed)
SAntibiotics per medication orders. Antitussives per medication orders. Avoid exposure to tobacco smoke and fumes. B-agonist inhaler. Call if shortness of breath worsens, blood in sputum, change in character of cough, development of fever or chills, inability to maintain nutrition and hydration. Avoid exposure to tobacco smoke and fumes. subjective:     Ebony Curtis is a 54 y.o. female here for evaluation of a cough. Onset of symptoms was 1 week ago. Symptoms have been gradually worsening since that time. The cough is barky and productive and is aggravated by exercise and reclining position. Associated symptoms include: chills, fever, postnasal drip, shortness of breath, sputum production and wheezing. Patient does have a history of asthma. Patient does have a history of environmental allergens. Patient has not traveled recently. Patient does have a history of smoking. Patient has not had a previous chest x-ray. Patient has not had a PPD done.  The following portions of the patient's history were reviewed and updated as appropriate:  She  has a past medical history of GERD (gastroesophageal reflux disease); Hyperlipidemia; Hypertension; Migraines; and Chronic lower back pain. She  does not have any pertinent problems on file. She  has past surgical history that includes Tonsillectomy. Her family history includes Arthritis in her mother; Coronary artery disease in an other family member; Diabetes in her sister; Heart disease in her father, mother, and sister. She  reports that she has been smoking Cigarettes.  She has been smoking about 1.00 pack per day. She does not have any smokeless tobacco history on file. She reports that she does not drink alcohol or use illicit drugs. She has a current medication list which includes the following prescription(s): albuterol, aspirin, diazepam, diclofenac sodium, esomeprazole, fish oil-omega-3 fatty acids, multivitamin, simvastatin, and tramadol. Current  Outpatient Prescriptions on File Prior to Visit  Medication Sig Dispense Refill  . albuterol (PROAIR HFA) 108 (90 BASE) MCG/ACT inhaler Inhale 2 puffs into the lungs every 6 (six) hours as needed for wheezing or shortness of breath. 1 Inhaler 2  . aspirin 81 MG tablet Take 81 mg by mouth daily.    . diazepam (VALIUM) 5 MG tablet Take 1 tablet (5 mg total) by mouth every 6 (six) hours as needed. 30 tablet 1  . diclofenac sodium (VOLTAREN) 1 % GEL Apply 2 g topically 4 (four) times daily. Prn back pain 100 g 5  . esomeprazole (NEXIUM) 40 MG capsule Take 1 capsule (40 mg total) by mouth daily before breakfast. 90 capsule 3  . fish oil-omega-3 fatty acids 1000 MG capsule Take 1 g by mouth daily.      . Multiple Vitamin (MULTIVITAMIN) tablet Take 1 tablet by mouth daily.      . simvastatin (ZOCOR) 40 MG tablet TAKE 1 TABLET BY MOUTH DAILY 90 tablet 1  . traMADol (ULTRAM) 50 MG tablet Take 1 tablet (50 mg total) by mouth every 6 (six) hours as needed. 30 tablet 2   No current facility-administered medications on file prior to visit.   She has No Known Allergies..  Review of Systems Pertinent items are noted in HPI.    Objective:    Oxygen saturation 97% on room air BP 132/82 mmHg  Pulse 91  Temp(Src) 98.6 F (37 C) (Oral)  Wt 241 lb 12.8 oz (109.68 kg)  SpO2 97% General appearance: alert, cooperative, appears stated age and no distress Ears: normal TM's and external ear canals both ears Nose: Nares normal. Septum midline. Mucosa normal. No drainage or sinus tenderness. Throat: lips, mucosa,  and tongue normal; teeth and gums normal Neck: no adenopathy, supple, symmetrical, trachea midline and thyroid not enlarged, symmetric, no tenderness/mass/nodules Lungs: diminished breath sounds bilaterally and wheezes bilaterally Heart: S1, S2 normal    Assessment:    Acute Bronchitis    Plan:   rto in 3-4 days for cxr if no betterAntibiotics per medication orders. Antitussives per medication  orders. Avoid exposure to tobacco smoke and fumes. B-agonist inhaler. Call if shortness of breath worsens, blood in sputum, change in character of cough, development of fever or chills, inability to maintain nutrition and hydration. Avoid exposure to tobacco smoke and fumes. rto in 3-4 days for cxr if no better    1. Back pain, chronic Do not take ultram with tussionex - diclofenac sodium (VOLTAREN) 1 % GEL; Apply 2 g topically 4 (four) times daily. Prn back pain  Dispense: 100 g; Refill: 5 - Albuterol Sulfate (PROAIR RESPICLICK) 123XX123 (90 BASE) MCG/ACT AEPB; Inhale 1 Inhaler into the lungs every 6 (six) hours as needed.  Dispense: 1 each; Refill: 1 - traMADol (ULTRAM) 50 MG tablet; Take 1 tablet (50 mg total) by mouth every 6 (six) hours as needed.  Dispense: 30 tablet; Refill: 2  2. Acute bronchitis due to other specified organisms   - Albuterol Sulfate (PROAIR RESPICLICK) 123XX123 (90 BASE) MCG/ACT AEPB; Inhale 1 Inhaler into the lungs every 6 (six) hours as needed.  Dispense: 1 each; Refill: 1 - azithromycin (ZITHROMAX Z-PAK) 250 MG tablet; As directed  Dispense: 6 each; Refill: 0 - chlorpheniramine-HYDROcodone (TUSSIONEX PENNKINETIC ER) 10-8 MG/5ML SUER; Take 5 mLs by mouth every 12 (twelve) hours as needed for cough.  Dispense: 140 mL; Refill: 0

## 2015-03-18 NOTE — Addendum Note (Signed)
Addended by: Rosalita Chessman on: 03/18/2015 03:01 PM   Modules accepted: Orders

## 2015-03-25 ENCOUNTER — Other Ambulatory Visit: Payer: Self-pay | Admitting: Family Medicine

## 2015-03-25 NOTE — Telephone Encounter (Signed)
Last seen 03/18/15 and filled 12/04/13 #30 with 1   Please advise    KP

## 2015-03-29 ENCOUNTER — Telehealth: Payer: Self-pay | Admitting: Behavioral Health

## 2015-03-29 NOTE — Telephone Encounter (Signed)
Unable to reach patient at time of Pre-Visit Call.  Left message for patient to return call when available.    

## 2015-04-02 ENCOUNTER — Other Ambulatory Visit (HOSPITAL_COMMUNITY)
Admission: RE | Admit: 2015-04-02 | Discharge: 2015-04-02 | Disposition: A | Discharge status: Home / Self Care | Payer: Federal, State, Local not specified - PPO | Source: Ambulatory Visit | Attending: Family Medicine | Admitting: Family Medicine

## 2015-04-02 ENCOUNTER — Ambulatory Visit (INDEPENDENT_AMBULATORY_CARE_PROVIDER_SITE_OTHER): Payer: Federal, State, Local not specified - PPO | Admitting: Family Medicine

## 2015-04-02 ENCOUNTER — Encounter: Payer: Self-pay | Admitting: Family Medicine

## 2015-04-02 VITALS — BP 147/80 | HR 77 | Temp 97.7°F | Ht 61.0 in | Wt 241.4 lb

## 2015-04-02 DIAGNOSIS — R82998 Other abnormal findings in urine: Secondary | ICD-10-CM

## 2015-04-02 DIAGNOSIS — Z124 Encounter for screening for malignant neoplasm of cervix: Secondary | ICD-10-CM

## 2015-04-02 DIAGNOSIS — Z1151 Encounter for screening for human papillomavirus (HPV): Secondary | ICD-10-CM | POA: Diagnosis not present

## 2015-04-02 DIAGNOSIS — E785 Hyperlipidemia, unspecified: Secondary | ICD-10-CM

## 2015-04-02 DIAGNOSIS — R8299 Other abnormal findings in urine: Secondary | ICD-10-CM

## 2015-04-02 DIAGNOSIS — Z114 Encounter for screening for human immunodeficiency virus [HIV]: Secondary | ICD-10-CM

## 2015-04-02 DIAGNOSIS — M5441 Lumbago with sciatica, right side: Secondary | ICD-10-CM | POA: Diagnosis not present

## 2015-04-02 DIAGNOSIS — Z01411 Encounter for gynecological examination (general) (routine) with abnormal findings: Secondary | ICD-10-CM | POA: Insufficient documentation

## 2015-04-02 DIAGNOSIS — Z0000 Encounter for general adult medical examination without abnormal findings: Secondary | ICD-10-CM | POA: Diagnosis not present

## 2015-04-02 DIAGNOSIS — G8929 Other chronic pain: Secondary | ICD-10-CM

## 2015-04-02 DIAGNOSIS — Z1159 Encounter for screening for other viral diseases: Secondary | ICD-10-CM | POA: Diagnosis not present

## 2015-04-02 DIAGNOSIS — I10 Essential (primary) hypertension: Secondary | ICD-10-CM | POA: Diagnosis not present

## 2015-04-02 DIAGNOSIS — M549 Dorsalgia, unspecified: Secondary | ICD-10-CM

## 2015-04-02 LAB — COMPREHENSIVE METABOLIC PANEL
ALBUMIN: 3.8 g/dL (ref 3.5–5.2)
ALK PHOS: 61 U/L (ref 39–117)
ALT: 13 U/L (ref 0–35)
AST: 14 U/L (ref 0–37)
BUN: 9 mg/dL (ref 6–23)
CO2: 27 mEq/L (ref 19–32)
Calcium: 9.2 mg/dL (ref 8.4–10.5)
Chloride: 107 mEq/L (ref 96–112)
Creatinine, Ser: 0.81 mg/dL (ref 0.40–1.20)
GFR: 94.41 mL/min (ref >60.00)
Glucose, Bld: 110 mg/dL — ABNORMAL HIGH (ref 70–99)
POTASSIUM: 4.1 meq/L (ref 3.5–5.1)
SODIUM: 141 meq/L (ref 135–145)
TOTAL PROTEIN: 7.3 g/dL (ref 6.0–8.3)
Total Bilirubin: 0.2 mg/dL (ref 0.2–1.2)

## 2015-04-02 LAB — CBC WITH DIFFERENTIAL/PLATELET
BASOS PCT: 0.6 % (ref 0.0–3.0)
Basophils Absolute: 0.1 10*3/uL (ref 0.0–0.1)
EOS PCT: 1.4 % (ref 0.0–5.0)
Eosinophils Absolute: 0.1 10*3/uL (ref 0.0–0.7)
HEMATOCRIT: 42.2 % (ref 36.0–46.0)
Hemoglobin: 13.8 g/dL (ref 12.0–15.0)
LYMPHS PCT: 48 % — AB (ref 12.0–46.0)
Lymphs Abs: 4.3 10*3/uL — ABNORMAL HIGH (ref 0.7–4.0)
MCHC: 32.8 g/dL (ref 30.0–36.0)
MCV: 92.6 fl (ref 78.0–100.0)
MONOS PCT: 5.4 % (ref 3.0–12.0)
Monocytes Absolute: 0.5 10*3/uL (ref 0.1–1.0)
NEUTROS ABS: 4 10*3/uL (ref 1.4–7.7)
Neutrophils Relative %: 44.6 % (ref 43.0–77.0)
PLATELETS: 312 10*3/uL (ref 150.0–400.0)
RBC: 4.56 Mil/uL (ref 3.87–5.11)
RDW: 14.4 % (ref 11.5–15.5)
WBC: 8.9 10*3/uL (ref 4.0–10.5)

## 2015-04-02 LAB — LIPID PANEL
CHOLESTEROL: 171 mg/dL (ref 0–200)
HDL: 40.3 mg/dL (ref >39.00)
LDL Cholesterol: 105 mg/dL — ABNORMAL HIGH (ref 0–99)
NonHDL: 130.24
TRIGLYCERIDES: 124 mg/dL (ref 0.0–149.0)
Total CHOL/HDL Ratio: 4
VLDL: 24.8 mg/dL (ref 0.0–40.0)

## 2015-04-02 LAB — POCT URINALYSIS DIPSTICK
BILIRUBIN UA: NEGATIVE
GLUCOSE UA: NEGATIVE
KETONES UA: NEGATIVE
Leukocytes, UA: NEGATIVE
NITRITE UA: NEGATIVE
Urobilinogen, UA: 0.2
pH, UA: 6

## 2015-04-02 LAB — TSH: TSH: 1.08 u[IU]/mL (ref 0.35–4.50)

## 2015-04-02 LAB — HIV ANTIBODY (ROUTINE TESTING W REFLEX): HIV: NONREACTIVE

## 2015-04-02 LAB — HEPATITIS C ANTIBODY: HCV Ab: NEGATIVE

## 2015-04-02 MED ORDER — SIMVASTATIN 40 MG PO TABS: 40.0000 mg | ORAL_TABLET | Freq: Every day | ORAL | Status: DC

## 2015-04-02 MED ORDER — TRAMADOL HCL 50 MG PO TABS: 50.0000 mg | ORAL_TABLET | Freq: Four times a day (QID) | ORAL | Status: DC | PRN

## 2015-04-02 NOTE — Progress Notes (Signed)
Subjective:     Ebony Curtis is a 54 y.o. female and is here for a comprehensive physical exam. The patient reports problems - low back pain cont-- pt does not want imaging yet-- agreed to pt.  Social History   Social History  . Marital Status: Single    Spouse Name: N/A  . Number of Children: N/A  . Years of Education: N/A   Occupational History  . [employer]    disability/ retired   Social History Main Topics  . Smoking status: Current Every Day Smoker -- 1.00 packs/day    Types: Cigarettes  . Smokeless tobacco: Not on file  . Alcohol Use: No  . Drug Use: No  . Sexual Activity:    Partners: Male   Other Topics Concern  . Not on file   Social History Narrative   Exercise-- walk   Health Maintenance  Topic Date Due  . Hepatitis C Screening  09-24-1960  . HIV Screening  03/30/1975  . COLONOSCOPY  03/29/2010  . PAP SMEAR  08/07/2014  . INFLUENZA VACCINE  03/17/2016 (Originally 11/05/2014)  . MAMMOGRAM  12/19/2015  . TETANUS/TDAP  08/27/2019    The following portions of the patient's history were reviewed and updated as appropriate:  She  has a past medical history of GERD (gastroesophageal reflux disease); Hyperlipidemia; Hypertension; Migraines; and Chronic lower back pain. She  does not have any pertinent problems on file. She  has past surgical history that includes Tonsillectomy. Her family history includes Arthritis in her mother; Diabetes in her sister; Heart disease in her father, mother, and sister. She  reports that she has been smoking Cigarettes.  She has been smoking about 1.00 pack per day. She does not have any smokeless tobacco history on file. She reports that she does not drink alcohol or use illicit drugs. She has a current medication list which includes the following prescription(s): albuterol sulfate, aspirin, chlorpheniramine-hydrocodone, diazepam, diclofenac sodium, esomeprazole, fish oil-omega-3 fatty acids, multivitamin,  simvastatin, and tramadol. Current Outpatient Prescriptions on File Prior to Visit  Medication Sig Dispense Refill  . Albuterol Sulfate (PROAIR RESPICLICK) 124 (90 BASE) MCG/ACT AEPB Inhale 1 Inhaler into the lungs every 6 (six) hours as needed. 1 each 1  . aspirin 81 MG tablet Take 81 mg by mouth daily.    . chlorpheniramine-HYDROcodone (TUSSIONEX PENNKINETIC ER) 10-8 MG/5ML SUER Take 5 mLs by mouth every 12 (twelve) hours as needed for cough. 140 mL 0  . diazepam (VALIUM) 5 MG tablet TAKE 1 TABLET BY MOUTH EVERY 6 HOURS AS NEEDED 30 tablet 0  . diclofenac sodium (VOLTAREN) 1 % GEL Apply 2 g topically 4 (four) times daily. Prn back pain 100 g 5  . esomeprazole (NEXIUM) 40 MG capsule Take 1 capsule (40 mg total) by mouth daily before breakfast. 90 capsule 3  . fish oil-omega-3 fatty acids 1000 MG capsule Take 1 g by mouth daily.      . Multiple Vitamin (MULTIVITAMIN) tablet Take 1 tablet by mouth daily.       No current facility-administered medications on file prior to visit.   She has No Known Allergies..  Review of Systems Review of Systems  Constitutional: Negative for activity change, appetite change and fatigue.  HENT: Negative for hearing loss, congestion, tinnitus and ear discharge.  dentist=----dentures Eyes: Negative for visual disturbance (see optho q1y -- vision corrected to 20/20 with glasses).  Respiratory: Negative for cough, chest tightness and shortness of breath.   Cardiovascular:  Negative for chest pain, palpitations and leg swelling.  Gastrointestinal: Negative for abdominal pain, diarrhea, constipation and abdominal distention.  Genitourinary: Negative for urgency, frequency, decreased urine volume and difficulty urinating.  Musculoskeletal: Negative for back pain, arthralgias and gait problem.  Skin: Negative for color change, pallor and rash.  Neurological: Negative for dizziness, light-headedness, numbness and headaches.  Hematological: Negative for adenopathy.  Does not bruise/bleed easily.  Psychiatric/Behavioral: Negative for suicidal ideas, confusion, sleep disturbance, self-injury, dysphoric mood, decreased concentration and agitation.       Objective:    BP 147/80 mmHg  Pulse 77  Temp(Src) 97.7 F (36.5 C) (Oral)  Ht '5\' 1"'  (1.549 m)  Wt 241 lb 6.4 oz (109.498 kg)  BMI 45.64 kg/m2  SpO2 98% General appearance: alert, cooperative, appears stated age and no distress Head: Normocephalic, without obvious abnormality, atraumatic Eyes: `+ Ears: normal TM's and external ear canals both ears Nose: Nares normal. Septum midline. Mucosa normal. No drainage or sinus tenderness. Throat: lips, mucosa, and tongue normal; teeth and gums normal Neck: no adenopathy, no carotid bruit, no JVD, supple, symmetrical, trachea midline and thyroid not enlarged, symmetric, no tenderness/mass/nodules Back: symmetric, no curvature. ROM normal. No CVA tenderness. Lungs: clear to auscultation bilaterally Breasts: normal appearance, no masses or tenderness Heart: regular rate and rhythm, S1, S2 normal, no murmur, click, rub or gallop Abdomen: soft, non-tender; bowel sounds normal; no masses,  no organomegaly Pelvic: cervix normal in appearance, external genitalia normal, no adnexal masses or tenderness, no cervical motion tenderness, rectovaginal septum normal, uterus normal size, shape, and consistency, vagina normal without discharge and pap done, rectal heme neg brown stool Extremities: extremities normal, atraumatic, no cyanosis or edema Pulses: 2+ and symmetric Skin: Skin color, texture, turgor normal. No rashes or lesions Lymph nodes: Cervical, supraclavicular, and axillary nodes normal. Neurologic: Alert and oriented X 3, normal strength and tone. Normal symmetric reflexes. Normal coordination and gait Psych- no depression, no anxiety      Assessment:    Healthy female exam.      Plan:    ghm -- needs colon, flu refused Check labs See After Visit  Summary for Counseling Recommendations    1. Low back pain with radiation, right Pt wants to hold off on imaging at this time - Ambulatory referral to Physical Therapy  2. Need for hepatitis C screening test   - Hepatitis C antibody  3. Screening for HIV (human immunodeficiency virus)   - HIV antibody  4. Essential hypertension stable - CBC with Differential/Platelet - POCT urinalysis dipstick - TSH - Comp Met (CMET)  5. Hyperlipidemia Check labs - Lipid panel - TSH - Comp Met (CMET)  6. Preventative health care  - Ambulatory referral to Gastroenterology  7. Screening for cervical cancer  - Cytology - PAP

## 2015-04-02 NOTE — Patient Instructions (Signed)
Preventive Care for Adults, Female A healthy lifestyle and preventive care can promote health and wellness. Preventive health guidelines for women include the following key practices.  A routine yearly physical is a good way to check with your health care provider about your health and preventive screening. It is a chance to share any concerns and updates on your health and to receive a thorough exam.  Visit your dentist for a routine exam and preventive care every 6 months. Brush your teeth twice a day and floss once a day. Good oral hygiene prevents tooth decay and gum disease.  The frequency of eye exams is based on your age, health, family medical history, use of contact lenses, and other factors. Follow your health care provider's recommendations for frequency of eye exams.  Eat a healthy diet. Foods like vegetables, fruits, whole grains, low-fat dairy products, and lean protein foods contain the nutrients you need without too many calories. Decrease your intake of foods high in solid fats, added sugars, and salt. Eat the right amount of calories for you.Get information about a proper diet from your health care provider, if necessary.  Regular physical exercise is one of the most important things you can do for your health. Most adults should get at least 150 minutes of moderate-intensity exercise (any activity that increases your heart rate and causes you to sweat) each week. In addition, most adults need muscle-strengthening exercises on 2 or more days a week.  Maintain a healthy weight. The body mass index (BMI) is a screening tool to identify possible weight problems. It provides an estimate of body fat based on height and weight. Your health care provider can find your BMI and can help you achieve or maintain a healthy weight.For adults 20 years and older:  A BMI below 18.5 is considered underweight.  A BMI of 18.5 to 24.9 is normal.  A BMI of 25 to 29.9 is considered overweight.  A  BMI of 30 and above is considered obese.  Maintain normal blood lipids and cholesterol levels by exercising and minimizing your intake of saturated fat. Eat a balanced diet with plenty of fruit and vegetables. Blood tests for lipids and cholesterol should begin at age 45 and be repeated every 5 years. If your lipid or cholesterol levels are high, you are over 50, or you are at high risk for heart disease, you may need your cholesterol levels checked more frequently.Ongoing high lipid and cholesterol levels should be treated with medicines if diet and exercise are not working.  If you smoke, find out from your health care provider how to quit. If you do not use tobacco, do not start.  Lung cancer screening is recommended for adults aged 45-80 years who are at high risk for developing lung cancer because of a history of smoking. A yearly low-dose CT scan of the lungs is recommended for people who have at least a 30-pack-year history of smoking and are a current smoker or have quit within the past 15 years. A pack year of smoking is smoking an average of 1 pack of cigarettes a day for 1 year (for example: 1 pack a day for 30 years or 2 packs a day for 15 years). Yearly screening should continue until the smoker has stopped smoking for at least 15 years. Yearly screening should be stopped for people who develop a health problem that would prevent them from having lung cancer treatment.  If you are pregnant, do not drink alcohol. If you are  breastfeeding, be very cautious about drinking alcohol. If you are not pregnant and choose to drink alcohol, do not have more than 1 drink per day. One drink is considered to be 12 ounces (355 mL) of beer, 5 ounces (148 mL) of wine, or 1.5 ounces (44 mL) of liquor.  Avoid use of street drugs. Do not share needles with anyone. Ask for help if you need support or instructions about stopping the use of drugs.  High blood pressure causes heart disease and increases the risk  of stroke. Your blood pressure should be checked at least every 1 to 2 years. Ongoing high blood pressure should be treated with medicines if weight loss and exercise do not work.  If you are 55-79 years old, ask your health care provider if you should take aspirin to prevent strokes.  Diabetes screening is done by taking a blood sample to check your blood glucose level after you have not eaten for a certain period of time (fasting). If you are not overweight and you do not have risk factors for diabetes, you should be screened once every 3 years starting at age 45. If you are overweight or obese and you are 40-70 years of age, you should be screened for diabetes every year as part of your cardiovascular risk assessment.  Breast cancer screening is essential preventive care for women. You should practice "breast self-awareness." This means understanding the normal appearance and feel of your breasts and may include breast self-examination. Any changes detected, no matter how small, should be reported to a health care provider. Women in their 20s and 30s should have a clinical breast exam (CBE) by a health care provider as part of a regular health exam every 1 to 3 years. After age 40, women should have a CBE every year. Starting at age 40, women should consider having a mammogram (breast X-ray test) every year. Women who have a family history of breast cancer should talk to their health care provider about genetic screening. Women at a high risk of breast cancer should talk to their health care providers about having an MRI and a mammogram every year.  Breast cancer gene (BRCA)-related cancer risk assessment is recommended for women who have family members with BRCA-related cancers. BRCA-related cancers include breast, ovarian, tubal, and peritoneal cancers. Having family members with these cancers may be associated with an increased risk for harmful changes (mutations) in the breast cancer genes BRCA1 and  BRCA2. Results of the assessment will determine the need for genetic counseling and BRCA1 and BRCA2 testing.  Your health care provider may recommend that you be screened regularly for cancer of the pelvic organs (ovaries, uterus, and vagina). This screening involves a pelvic examination, including checking for microscopic changes to the surface of your cervix (Pap test). You may be encouraged to have this screening done every 3 years, beginning at age 21.  For women ages 30-65, health care providers may recommend pelvic exams and Pap testing every 3 years, or they may recommend the Pap and pelvic exam, combined with testing for human papilloma virus (HPV), every 5 years. Some types of HPV increase your risk of cervical cancer. Testing for HPV may also be done on women of any age with unclear Pap test results.  Other health care providers may not recommend any screening for nonpregnant women who are considered low risk for pelvic cancer and who do not have symptoms. Ask your health care provider if a screening pelvic exam is right for   you.  If you have had past treatment for cervical cancer or a condition that could lead to cancer, you need Pap tests and screening for cancer for at least 20 years after your treatment. If Pap tests have been discontinued, your risk factors (such as having a new sexual partner) need to be reassessed to determine if screening should resume. Some women have medical problems that increase the chance of getting cervical cancer. In these cases, your health care provider may recommend more frequent screening and Pap tests.  Colorectal cancer can be detected and often prevented. Most routine colorectal cancer screening begins at the age of 50 years and continues through age 75 years. However, your health care provider may recommend screening at an earlier age if you have risk factors for colon cancer. On a yearly basis, your health care provider may provide home test kits to check  for hidden blood in the stool. Use of a small camera at the end of a tube, to directly examine the colon (sigmoidoscopy or colonoscopy), can detect the earliest forms of colorectal cancer. Talk to your health care provider about this at age 50, when routine screening begins. Direct exam of the colon should be repeated every 5-10 years through age 75 years, unless early forms of precancerous polyps or small growths are found.  People who are at an increased risk for hepatitis B should be screened for this virus. You are considered at high risk for hepatitis B if:  You were born in a country where hepatitis B occurs often. Talk with your health care provider about which countries are considered high risk.  Your parents were born in a high-risk country and you have not received a shot to protect against hepatitis B (hepatitis B vaccine).  You have HIV or AIDS.  You use needles to inject street drugs.  You live with, or have sex with, someone who has hepatitis B.  You get hemodialysis treatment.  You take certain medicines for conditions like cancer, organ transplantation, and autoimmune conditions.  Hepatitis C blood testing is recommended for all people born from 1945 through 1965 and any individual with known risks for hepatitis C.  Practice safe sex. Use condoms and avoid high-risk sexual practices to reduce the spread of sexually transmitted infections (STIs). STIs include gonorrhea, chlamydia, syphilis, trichomonas, herpes, HPV, and human immunodeficiency virus (HIV). Herpes, HIV, and HPV are viral illnesses that have no cure. They can result in disability, cancer, and death.  You should be screened for sexually transmitted illnesses (STIs) including gonorrhea and chlamydia if:  You are sexually active and are younger than 24 years.  You are older than 24 years and your health care provider tells you that you are at risk for this type of infection.  Your sexual activity has changed  since you were last screened and you are at an increased risk for chlamydia or gonorrhea. Ask your health care provider if you are at risk.  If you are at risk of being infected with HIV, it is recommended that you take a prescription medicine daily to prevent HIV infection. This is called preexposure prophylaxis (PrEP). You are considered at risk if:  You are sexually active and do not regularly use condoms or know the HIV status of your partner(s).  You take drugs by injection.  You are sexually active with a partner who has HIV.  Talk with your health care provider about whether you are at high risk of being infected with HIV. If   you choose to begin PrEP, you should first be tested for HIV. You should then be tested every 3 months for as long as you are taking PrEP.  Osteoporosis is a disease in which the bones lose minerals and strength with aging. This can result in serious bone fractures or breaks. The risk of osteoporosis can be identified using a bone density scan. Women ages 67 years and over and women at risk for fractures or osteoporosis should discuss screening with their health care providers. Ask your health care provider whether you should take a calcium supplement or vitamin D to reduce the rate of osteoporosis.  Menopause can be associated with physical symptoms and risks. Hormone replacement therapy is available to decrease symptoms and risks. You should talk to your health care provider about whether hormone replacement therapy is right for you.  Use sunscreen. Apply sunscreen liberally and repeatedly throughout the day. You should seek shade when your shadow is shorter than you. Protect yourself by wearing long sleeves, pants, a wide-brimmed hat, and sunglasses year round, whenever you are outdoors.  Once a month, do a whole body skin exam, using a mirror to look at the skin on your back. Tell your health care provider of new moles, moles that have irregular borders, moles that  are larger than a pencil eraser, or moles that have changed in shape or color.  Stay current with required vaccines (immunizations).  Influenza vaccine. All adults should be immunized every year.  Tetanus, diphtheria, and acellular pertussis (Td, Tdap) vaccine. Pregnant women should receive 1 dose of Tdap vaccine during each pregnancy. The dose should be obtained regardless of the length of time since the last dose. Immunization is preferred during the 27th-36th week of gestation. An adult who has not previously received Tdap or who does not know her vaccine status should receive 1 dose of Tdap. This initial dose should be followed by tetanus and diphtheria toxoids (Td) booster doses every 10 years. Adults with an unknown or incomplete history of completing a 3-dose immunization series with Td-containing vaccines should begin or complete a primary immunization series including a Tdap dose. Adults should receive a Td booster every 10 years.  Varicella vaccine. An adult without evidence of immunity to varicella should receive 2 doses or a second dose if she has previously received 1 dose. Pregnant females who do not have evidence of immunity should receive the first dose after pregnancy. This first dose should be obtained before leaving the health care facility. The second dose should be obtained 4-8 weeks after the first dose.  Human papillomavirus (HPV) vaccine. Females aged 13-26 years who have not received the vaccine previously should obtain the 3-dose series. The vaccine is not recommended for use in pregnant females. However, pregnancy testing is not needed before receiving a dose. If a female is found to be pregnant after receiving a dose, no treatment is needed. In that case, the remaining doses should be delayed until after the pregnancy. Immunization is recommended for any person with an immunocompromised condition through the age of 61 years if she did not get any or all doses earlier. During the  3-dose series, the second dose should be obtained 4-8 weeks after the first dose. The third dose should be obtained 24 weeks after the first dose and 16 weeks after the second dose.  Zoster vaccine. One dose is recommended for adults aged 30 years or older unless certain conditions are present.  Measles, mumps, and rubella (MMR) vaccine. Adults born  before 1957 generally are considered immune to measles and mumps. Adults born in 1957 or later should have 1 or more doses of MMR vaccine unless there is a contraindication to the vaccine or there is laboratory evidence of immunity to each of the three diseases. A routine second dose of MMR vaccine should be obtained at least 28 days after the first dose for students attending postsecondary schools, health care workers, or international travelers. People who received inactivated measles vaccine or an unknown type of measles vaccine during 1963-1967 should receive 2 doses of MMR vaccine. People who received inactivated mumps vaccine or an unknown type of mumps vaccine before 1979 and are at high risk for mumps infection should consider immunization with 2 doses of MMR vaccine. For females of childbearing age, rubella immunity should be determined. If there is no evidence of immunity, females who are not pregnant should be vaccinated. If there is no evidence of immunity, females who are pregnant should delay immunization until after pregnancy. Unvaccinated health care workers born before 1957 who lack laboratory evidence of measles, mumps, or rubella immunity or laboratory confirmation of disease should consider measles and mumps immunization with 2 doses of MMR vaccine or rubella immunization with 1 dose of MMR vaccine.  Pneumococcal 13-valent conjugate (PCV13) vaccine. When indicated, a person who is uncertain of his immunization history and has no record of immunization should receive the PCV13 vaccine. All adults 65 years of age and older should receive this  vaccine. An adult aged 19 years or older who has certain medical conditions and has not been previously immunized should receive 1 dose of PCV13 vaccine. This PCV13 should be followed with a dose of pneumococcal polysaccharide (PPSV23) vaccine. Adults who are at high risk for pneumococcal disease should obtain the PPSV23 vaccine at least 8 weeks after the dose of PCV13 vaccine. Adults older than 55 years of age who have normal immune system function should obtain the PPSV23 vaccine dose at least 1 year after the dose of PCV13 vaccine.  Pneumococcal polysaccharide (PPSV23) vaccine. When PCV13 is also indicated, PCV13 should be obtained first. All adults aged 65 years and older should be immunized. An adult younger than age 65 years who has certain medical conditions should be immunized. Any person who resides in a nursing home or long-term care facility should be immunized. An adult smoker should be immunized. People with an immunocompromised condition and certain other conditions should receive both PCV13 and PPSV23 vaccines. People with human immunodeficiency virus (HIV) infection should be immunized as soon as possible after diagnosis. Immunization during chemotherapy or radiation therapy should be avoided. Routine use of PPSV23 vaccine is not recommended for American Indians, [place] Natives, or people younger than 65 years unless there are medical conditions that require PPSV23 vaccine. When indicated, people who have unknown immunization and have no record of immunization should receive PPSV23 vaccine. One-time revaccination 5 years after the first dose of PPSV23 is recommended for people aged 19-64 years who have chronic kidney failure, nephrotic syndrome, asplenia, or immunocompromised conditions. People who received 1-2 doses of PPSV23 before age 65 years should receive another dose of PPSV23 vaccine at age 65 years or later if at least 5 years have passed since the previous dose. Doses of PPSV23 are not  needed for people immunized with PPSV23 at or after age 65 years.  Meningococcal vaccine. Adults with asplenia or persistent complement component deficiencies should receive 2 doses of quadrivalent meningococcal conjugate (MenACWY-D) vaccine. The doses should be obtained   at least 2 months apart. Microbiologists working with certain meningococcal bacteria, [place] recruits, people at risk during an outbreak, and people who travel to or live in countries with a high rate of meningitis should be immunized. A first-year college student up through age 34 years who is living in a residence hall should receive a dose if she did not receive a dose on or after her 16th birthday. Adults who have certain high-risk conditions should receive one or more doses of vaccine.  Hepatitis A vaccine. Adults who wish to be protected from this disease, have certain high-risk conditions, work with hepatitis A-infected animals, work in hepatitis A research labs, or travel to or work in countries with a high rate of hepatitis A should be immunized. Adults who were previously unvaccinated and who anticipate close contact with an international adoptee during the first 60 days after arrival in the [place] from a country with a high rate of hepatitis A should be immunized.  Hepatitis B vaccine. Adults who wish to be protected from this disease, have certain high-risk conditions, may be exposed to blood or other infectious body fluids, are household contacts or sex partners of hepatitis B positive people, are clients or workers in certain care facilities, or travel to or work in countries with a high rate of hepatitis B should be immunized.  Haemophilus influenzae type b (Hib) vaccine. A previously unvaccinated person with asplenia or sickle cell disease or having a scheduled splenectomy should receive 1 dose of Hib vaccine. Regardless of previous immunization, a recipient of a hematopoietic stem cell transplant should receive a  3-dose series 6-12 months after her successful transplant. Hib vaccine is not recommended for adults with HIV infection. Preventive Services / Frequency Ages 35 to 4 years  Blood pressure check.** / Every 3-5 years.  Lipid and cholesterol check.** / Every 5 years beginning at age 60.  Clinical breast exam.** / Every 3 years for women in their 71s and 10s.  BRCA-related cancer risk assessment.** / For women who have family members with a BRCA-related cancer (breast, ovarian, tubal, or peritoneal cancers).  Pap test.** / Every 2 years from ages 76 through 26. Every 3 years starting at age 61 through age 76 or 93 with a history of 3 consecutive normal Pap tests.  HPV screening.** / Every 3 years from ages 37 through ages 60 to 51 with a history of 3 consecutive normal Pap tests.  Hepatitis C blood test.** / For any individual with known risks for hepatitis C.  Skin self-exam. / Monthly.  Influenza vaccine. / Every year.  Tetanus, diphtheria, and acellular pertussis (Tdap, Td) vaccine.** / Consult your health care provider. Pregnant women should receive 1 dose of Tdap vaccine during each pregnancy. 1 dose of Td every 10 years.  Varicella vaccine.** / Consult your health care provider. Pregnant females who do not have evidence of immunity should receive the first dose after pregnancy.  HPV vaccine. / 3 doses over 6 months, if 93 and younger. The vaccine is not recommended for use in pregnant females. However, pregnancy testing is not needed before receiving a dose.  Measles, mumps, rubella (MMR) vaccine.** / You need at least 1 dose of MMR if you were born in 1957 or later. You may also need a 2nd dose. For females of childbearing age, rubella immunity should be determined. If there is no evidence of immunity, females who are not pregnant should be vaccinated. If there is no evidence of immunity, females who are  pregnant should delay immunization until after pregnancy.  Pneumococcal  13-valent conjugate (PCV13) vaccine.** / Consult your health care provider.  Pneumococcal polysaccharide (PPSV23) vaccine.** / 1 to 2 doses if you smoke cigarettes or if you have certain conditions.  Meningococcal vaccine.** / 1 dose if you are age 68 to 8 years and a Market researcher living in a residence hall, or have one of several medical conditions, you need to get vaccinated against meningococcal disease. You may also need additional booster doses.  Hepatitis A vaccine.** / Consult your health care provider.  Hepatitis B vaccine.** / Consult your health care provider.  Haemophilus influenzae type b (Hib) vaccine.** / Consult your health care provider. Ages 7 to 53 years  Blood pressure check.** / Every year.  Lipid and cholesterol check.** / Every 5 years beginning at age 25 years.  Lung cancer screening. / Every year if you are aged 11-80 years and have a 30-pack-year history of smoking and currently smoke or have quit within the past 15 years. Yearly screening is stopped once you have quit smoking for at least 15 years or develop a health problem that would prevent you from having lung cancer treatment.  Clinical breast exam.** / Every year after age 48 years.  BRCA-related cancer risk assessment.** / For women who have family members with a BRCA-related cancer (breast, ovarian, tubal, or peritoneal cancers).  Mammogram.** / Every year beginning at age 41 years and continuing for as long as you are in good health. Consult with your health care provider.  Pap test.** / Every 3 years starting at age 65 years through age 37 or 70 years with a history of 3 consecutive normal Pap tests.  HPV screening.** / Every 3 years from ages 72 years through ages 60 to 40 years with a history of 3 consecutive normal Pap tests.  Fecal occult blood test (FOBT) of stool. / Every year beginning at age 21 years and continuing until age 5 years. You may not need to do this test if you get  a colonoscopy every 10 years.  Flexible sigmoidoscopy or colonoscopy.** / Every 5 years for a flexible sigmoidoscopy or every 10 years for a colonoscopy beginning at age 35 years and continuing until age 48 years.  Hepatitis C blood test.** / For all people born from [birth year] through 1965 and any individual with known risks for hepatitis C.  Skin self-exam. / Monthly.  Influenza vaccine. / Every year.  Tetanus, diphtheria, and acellular pertussis (Tdap/Td) vaccine.** / Consult your health care provider. Pregnant women should receive 1 dose of Tdap vaccine during each pregnancy. 1 dose of Td every 10 years.  Varicella vaccine.** / Consult your health care provider. Pregnant females who do not have evidence of immunity should receive the first dose after pregnancy.  Zoster vaccine.** / 1 dose for adults aged 30 years or older.  Measles, mumps, rubella (MMR) vaccine.** / You need at least 1 dose of MMR if you were born in 1957 or later. You may also need a second dose. For females of childbearing age, rubella immunity should be determined. If there is no evidence of immunity, females who are not pregnant should be vaccinated. If there is no evidence of immunity, females who are pregnant should delay immunization until after pregnancy.  Pneumococcal 13-valent conjugate (PCV13) vaccine.** / Consult your health care provider.  Pneumococcal polysaccharide (PPSV23) vaccine.** / 1 to 2 doses if you smoke cigarettes or if you have certain conditions.  Meningococcal vaccine.** /  Consult your health care provider.  Hepatitis A vaccine.** / Consult your health care provider.  Hepatitis B vaccine.** / Consult your health care provider.  Haemophilus influenzae type b (Hib) vaccine.** / Consult your health care provider. Ages 64 years and over  Blood pressure check.** / Every year.  Lipid and cholesterol check.** / Every 5 years beginning at age 23 years.  Lung cancer screening. / Every year if you  are aged 16-80 years and have a 30-pack-year history of smoking and currently smoke or have quit within the past 15 years. Yearly screening is stopped once you have quit smoking for at least 15 years or develop a health problem that would prevent you from having lung cancer treatment.  Clinical breast exam.** / Every year after age 74 years.  BRCA-related cancer risk assessment.** / For women who have family members with a BRCA-related cancer (breast, ovarian, tubal, or peritoneal cancers).  Mammogram.** / Every year beginning at age 44 years and continuing for as long as you are in good health. Consult with your health care provider.  Pap test.** / Every 3 years starting at age 58 years through age 22 or 39 years with 3 consecutive normal Pap tests. Testing can be stopped between 65 and 70 years with 3 consecutive normal Pap tests and no abnormal Pap or HPV tests in the past 10 years.  HPV screening.** / Every 3 years from ages 64 years through ages 70 or 61 years with a history of 3 consecutive normal Pap tests. Testing can be stopped between 65 and 70 years with 3 consecutive normal Pap tests and no abnormal Pap or HPV tests in the past 10 years.  Fecal occult blood test (FOBT) of stool. / Every year beginning at age 40 years and continuing until age 27 years. You may not need to do this test if you get a colonoscopy every 10 years.  Flexible sigmoidoscopy or colonoscopy.** / Every 5 years for a flexible sigmoidoscopy or every 10 years for a colonoscopy beginning at age 7 years and continuing until age 32 years.  Hepatitis C blood test.** / For all people born from [birth year] through 1966 and any individual with known risks for hepatitis C.  Osteoporosis screening.** / A one-time screening for women ages 30 years and over and women at risk for fractures or osteoporosis.  Skin self-exam. / Monthly.  Influenza vaccine. / Every year.  Tetanus, diphtheria, and acellular pertussis (Tdap/Td)  vaccine.** / 1 dose of Td every 10 years.  Varicella vaccine.** / Consult your health care provider.  Zoster vaccine.** / 1 dose for adults aged 35 years or older.  Pneumococcal 13-valent conjugate (PCV13) vaccine.** / Consult your health care provider.  Pneumococcal polysaccharide (PPSV23) vaccine.** / 1 dose for all adults aged 46 years and older.  Meningococcal vaccine.** / Consult your health care provider.  Hepatitis A vaccine.** / Consult your health care provider.  Hepatitis B vaccine.** / Consult your health care provider.  Haemophilus influenzae type b (Hib) vaccine.** / Consult your health care provider. ** Family history and personal history of risk and conditions may change your health care provider's recommendations.   This information is not intended to replace advice given to you by your health care provider. Make sure you discuss any questions you have with your health care provider.   Document Released: 05/19/2001 Document Revised: 04/13/2014 Document Reviewed: 08/18/2010 Elsevier Interactive Patient Education [employer].

## 2015-04-02 NOTE — Addendum Note (Signed)
Addended by: Caffie Pinto on: 04/02/2015 02:14 PM   Modules accepted: Orders

## 2015-04-02 NOTE — Progress Notes (Signed)
Pre visit review using our clinic review tool, if applicable. No additional management support is needed unless otherwise documented below in the visit note. 

## 2015-04-04 LAB — URINE CULTURE

## 2015-04-04 LAB — CYTOLOGY - PAP

## 2015-04-10 ENCOUNTER — Other Ambulatory Visit: Payer: Self-pay

## 2015-04-10 MED ORDER — METRONIDAZOLE 500 MG PO TABS: 500.0000 mg | ORAL_TABLET | Freq: Two times a day (BID) | ORAL | Status: DC

## 2015-04-13 ENCOUNTER — Other Ambulatory Visit: Payer: Self-pay | Admitting: Family Medicine

## 2015-04-13 DIAGNOSIS — N390 Urinary tract infection, site not specified: Secondary | ICD-10-CM

## 2015-04-13 MED ORDER — CIPROFLOXACIN HCL 250 MG PO TABS: 250.0000 mg | ORAL_TABLET | Freq: Two times a day (BID) | ORAL | Status: DC

## 2015-04-25 ENCOUNTER — Telehealth: Payer: Self-pay | Admitting: Family Medicine

## 2015-04-25 DIAGNOSIS — D229 Melanocytic nevi, unspecified: Secondary | ICD-10-CM

## 2015-04-25 NOTE — Telephone Encounter (Signed)
Message left to call the office.    KP 

## 2015-04-25 NOTE — Telephone Encounter (Signed)
Caller name: Self  Can be reached: [phone]   Reason for call: Patient is requesting referral to a Dermatologist because she has moles on her face and skin blotches. ALSO, the Rehab that she was referred to need more information from Dr. Etter Sjogren. States she will explain to [place] when she calls her

## 2015-04-26 NOTE — Telephone Encounter (Signed)
She is requesting a ref to Dermatology for the moles on her face, she would like to go somewhere close. The place that we referred her to for PT needs the approval for PT from Workers comp before they will do PT.  Please advise   KP

## 2015-04-26 NOTE — Telephone Encounter (Signed)
Pt states to call her back today at [phone] for more information.

## 2015-04-26 NOTE — Telephone Encounter (Signed)
Referral faxed to [facility], awaiting appt

## 2015-04-26 NOTE — Telephone Encounter (Signed)
Ok to refer to [place]

## 2015-07-11 ENCOUNTER — Telehealth: Payer: Self-pay | Admitting: Family Medicine

## 2015-07-11 DIAGNOSIS — M5441 Lumbago with sciatica, right side: Secondary | ICD-10-CM

## 2015-07-11 NOTE — Telephone Encounter (Signed)
Requesting to re-submit her referral for PT. Ref has been placed.    KP

## 2015-07-11 NOTE — Telephone Encounter (Signed)
Self Pt called in to request a call back from [facility]   Phone: [phone]

## 2015-09-27 ENCOUNTER — Other Ambulatory Visit: Payer: Self-pay | Admitting: Family Medicine

## 2015-09-27 NOTE — Telephone Encounter (Signed)
Last seen and filled   Tramadol 04/02/15 #30 with 2 ref Valium 03/25/15 #30  Please advise   KP

## 2015-09-30 MED ORDER — TRAMADOL HCL 50 MG PO TABS: 50.0000 mg | ORAL_TABLET | Freq: Four times a day (QID) | ORAL | Status: DC | PRN

## 2015-09-30 MED ORDER — DIAZEPAM 5 MG PO TABS: 5.0000 mg | ORAL_TABLET | Freq: Four times a day (QID) | ORAL | Status: DC | PRN

## 2015-09-30 NOTE — Telephone Encounter (Signed)
Rx faxed.    KP 

## 2015-09-30 NOTE — Addendum Note (Signed)
Addended by: Ewing Schlein on: 09/30/2015 08:05 AM   Modules accepted: Orders

## 2015-10-27 ENCOUNTER — Other Ambulatory Visit: Payer: Self-pay | Admitting: Family Medicine

## 2016-04-16 ENCOUNTER — Other Ambulatory Visit: Payer: Self-pay | Admitting: Family Medicine

## 2016-04-16 NOTE — Telephone Encounter (Signed)
Per Dr. Carollee Herter, the Patient will need to be seen in the Office. Was last seen 04/02/2015.

## 2016-04-20 ENCOUNTER — Other Ambulatory Visit: Payer: Self-pay | Admitting: Family Medicine

## 2016-04-27 ENCOUNTER — Other Ambulatory Visit: Payer: Self-pay | Admitting: Family Medicine

## 2016-04-29 NOTE — Telephone Encounter (Signed)
Attempted to contact patient @ [phone]. No answer and no voicemail.

## 2016-05-14 ENCOUNTER — Other Ambulatory Visit: Payer: Self-pay | Admitting: Family Medicine

## 2016-05-19 ENCOUNTER — Telehealth: Payer: Self-pay | Admitting: Family Medicine

## 2016-05-19 NOTE — Telephone Encounter (Signed)
Relation to [id number] Call back number:[phone] (M)   Reason for call:  Patient requesting a refill traMADol (ULTRAM) 50 MG tablet

## 2016-05-19 NOTE — Telephone Encounter (Signed)
I called this patient and informed her that she has not been seen by PCP since 04/02/2015 and does need an appointment to get refills.  She did not understand at first and just stated she needed her refill. I did inform her the need to schedule appointment and I would forward this request to PCP.  She did agree to-scheduled this Thursday 05/21/2016 at 11 AM LAST REFILL ON 09/30/2015 LAST OFFICE VISIT 03/23/2015---NEXT SCHEDULED APPT. IS 05/21/2016 NO UDS/NO CONTRACT

## 2016-05-19 NOTE — Telephone Encounter (Signed)
WILL REFILL ON THURSDAY

## 2016-05-21 ENCOUNTER — Ambulatory Visit: Payer: Federal, State, Local not specified - PPO | Admitting: Family Medicine

## 2016-06-01 ENCOUNTER — Observation Stay (HOSPITAL_BASED_OUTPATIENT_CLINIC_OR_DEPARTMENT_OTHER)
Admission: EM | Admit: 2016-06-01 | Discharge: 2016-06-02 | Disposition: A | Discharge status: Home / Self Care | Payer: Federal, State, Local not specified - PPO | Attending: Internal Medicine | Admitting: Internal Medicine

## 2016-06-01 ENCOUNTER — Emergency Department (HOSPITAL_BASED_OUTPATIENT_CLINIC_OR_DEPARTMENT_OTHER): Payer: Federal, State, Local not specified - PPO

## 2016-06-01 ENCOUNTER — Encounter (HOSPITAL_BASED_OUTPATIENT_CLINIC_OR_DEPARTMENT_OTHER): Payer: Self-pay | Admitting: *Deleted

## 2016-06-01 DIAGNOSIS — F419 Anxiety disorder, unspecified: Secondary | ICD-10-CM | POA: Diagnosis not present

## 2016-06-01 DIAGNOSIS — K219 Gastro-esophageal reflux disease without esophagitis: Secondary | ICD-10-CM | POA: Diagnosis not present

## 2016-06-01 DIAGNOSIS — Z86718 Personal history of other venous thrombosis and embolism: Secondary | ICD-10-CM | POA: Insufficient documentation

## 2016-06-01 DIAGNOSIS — R05 Cough: Secondary | ICD-10-CM | POA: Diagnosis not present

## 2016-06-01 DIAGNOSIS — R0789 Other chest pain: Secondary | ICD-10-CM | POA: Diagnosis present

## 2016-06-01 DIAGNOSIS — R062 Wheezing: Secondary | ICD-10-CM | POA: Diagnosis not present

## 2016-06-01 DIAGNOSIS — F418 Other specified anxiety disorders: Secondary | ICD-10-CM | POA: Diagnosis present

## 2016-06-01 DIAGNOSIS — I10 Essential (primary) hypertension: Secondary | ICD-10-CM | POA: Diagnosis not present

## 2016-06-01 DIAGNOSIS — E785 Hyperlipidemia, unspecified: Secondary | ICD-10-CM | POA: Diagnosis present

## 2016-06-01 DIAGNOSIS — F17210 Nicotine dependence, cigarettes, uncomplicated: Secondary | ICD-10-CM | POA: Insufficient documentation

## 2016-06-01 DIAGNOSIS — R079 Chest pain, unspecified: Secondary | ICD-10-CM | POA: Diagnosis present

## 2016-06-01 DIAGNOSIS — E669 Obesity, unspecified: Secondary | ICD-10-CM | POA: Insufficient documentation

## 2016-06-01 DIAGNOSIS — E1169 Type 2 diabetes mellitus with other specified complication: Secondary | ICD-10-CM | POA: Diagnosis present

## 2016-06-01 HISTORY — DX: Cardiac murmur, unspecified: R01.1

## 2016-06-01 HISTORY — DX: Acute embolism and thrombosis of unspecified deep veins of unspecified lower extremity: I82.409

## 2016-06-01 HISTORY — DX: Family history of other specified conditions: Z84.89

## 2016-06-01 LAB — CBC
HCT: 43.2 % (ref 36.0–46.0)
HEMOGLOBIN: 14.6 g/dL (ref 12.0–15.0)
MCH: 30.8 pg (ref 26.0–34.0)
MCHC: 33.8 g/dL (ref 30.0–36.0)
MCV: 91.1 fL (ref 78.0–100.0)
Platelets: 283 10*3/uL (ref 150–400)
RBC: 4.74 MIL/uL (ref 3.87–5.11)
RDW: 14.5 % (ref 11.5–15.5)
WBC: 9.4 10*3/uL (ref 4.0–10.5)

## 2016-06-01 LAB — BASIC METABOLIC PANEL
ANION GAP: 7 (ref 5–15)
BUN: 9 mg/dL (ref 6–20)
CALCIUM: 8.9 mg/dL (ref 8.9–10.3)
CHLORIDE: 107 mmol/L (ref 101–111)
CO2: 25 mmol/L (ref 22–32)
Creatinine, Ser: 0.89 mg/dL (ref 0.44–1.00)
GFR calc non Af Amer: >60 mL/min (ref >60)
Glucose, Bld: 102 mg/dL — ABNORMAL HIGH (ref 65–99)
Potassium: 4.4 mmol/L (ref 3.5–5.1)
Sodium: 139 mmol/L (ref 135–145)

## 2016-06-01 LAB — BRAIN NATRIURETIC PEPTIDE: B Natriuretic Peptide: 5.8 pg/mL (ref 0.0–100.0)

## 2016-06-01 LAB — TROPONIN I

## 2016-06-01 MED ORDER — LABETALOL HCL 5 MG/ML IV SOLN: 5.0000 mg | INTRAVENOUS | Status: DC | PRN

## 2016-06-01 MED ORDER — BENZONATATE 100 MG PO CAPS: 100.0000 mg | ORAL_CAPSULE | Freq: Three times a day (TID) | ORAL | Status: DC | PRN

## 2016-06-01 MED ORDER — NITROGLYCERIN 0.4 MG SL SUBL
0.4000 mg | SUBLINGUAL_TABLET | SUBLINGUAL | Status: DC | PRN
  Administered 2016-06-01 (×3): 0.4 mg via SUBLINGUAL
  Filled 2016-06-01: qty 1

## 2016-06-01 MED ORDER — NITROGLYCERIN 0.4 MG SL SUBL: 0.4000 mg | SUBLINGUAL_TABLET | SUBLINGUAL | Status: DC | PRN

## 2016-06-01 MED ORDER — OMEGA-3 FATTY ACIDS 1000 MG PO CAPS: 1.0000 g | ORAL_CAPSULE | Freq: Every day | ORAL | Status: DC

## 2016-06-01 MED ORDER — ASPIRIN 81 MG PO CHEW
324.0000 mg | CHEWABLE_TABLET | Freq: Once | ORAL | Status: AC
  Administered 2016-06-02: 324 mg via ORAL
  Filled 2016-06-01: qty 4

## 2016-06-01 MED ORDER — SODIUM CHLORIDE 0.9% FLUSH: 3.0000 mL | Freq: Two times a day (BID) | INTRAVENOUS | Status: DC

## 2016-06-01 MED ORDER — ENOXAPARIN SODIUM 40 MG/0.4ML ~~LOC~~ SOLN
40.0000 mg | Freq: Every day | SUBCUTANEOUS | Status: DC
  Administered 2016-06-02: 40 mg via SUBCUTANEOUS
  Filled 2016-06-01: qty 0.4

## 2016-06-01 MED ORDER — OMEGA-3-ACID ETHYL ESTERS 1 G PO CAPS
1.0000 g | ORAL_CAPSULE | Freq: Every day | ORAL | Status: DC
  Administered 2016-06-02: 1 g via ORAL
  Filled 2016-06-01: qty 1

## 2016-06-01 MED ORDER — GI COCKTAIL ~~LOC~~: 30.0000 mL | Freq: Three times a day (TID) | ORAL | Status: DC | PRN

## 2016-06-01 MED ORDER — ACETAMINOPHEN 325 MG PO TABS: 650.0000 mg | ORAL_TABLET | ORAL | Status: DC | PRN

## 2016-06-01 MED ORDER — SODIUM CHLORIDE 0.9 % IV SOLN: 250.0000 mL | INTRAVENOUS | Status: DC | PRN

## 2016-06-01 MED ORDER — DIAZEPAM 5 MG PO TABS: 5.0000 mg | ORAL_TABLET | Freq: Four times a day (QID) | ORAL | Status: DC | PRN

## 2016-06-01 MED ORDER — DICLOFENAC SODIUM 1 % TD GEL
2.0000 g | Freq: Four times a day (QID) | TRANSDERMAL | Status: DC
  Filled 2016-06-01: qty 100

## 2016-06-01 MED ORDER — ONDANSETRON HCL 4 MG/2ML IJ SOLN: 4.0000 mg | Freq: Four times a day (QID) | INTRAMUSCULAR | Status: DC | PRN

## 2016-06-01 MED ORDER — SIMVASTATIN 40 MG PO TABS: 40.0000 mg | ORAL_TABLET | Freq: Every day | ORAL | Status: DC

## 2016-06-01 MED ORDER — IPRATROPIUM-ALBUTEROL 0.5-2.5 (3) MG/3ML IN SOLN
3.0000 mL | RESPIRATORY_TRACT | Status: DC | PRN
Start: 2016-06-01 — End: 2016-06-02

## 2016-06-01 MED ORDER — PANTOPRAZOLE SODIUM 40 MG PO TBEC
40.0000 mg | DELAYED_RELEASE_TABLET | Freq: Every day | ORAL | Status: DC
Start: 2016-06-02 — End: 2016-06-02
  Administered 2016-06-02: 40 mg via ORAL
  Filled 2016-06-01: qty 1

## 2016-06-01 MED ORDER — ASPIRIN 81 MG PO CHEW
81.0000 mg | CHEWABLE_TABLET | Freq: Every day | ORAL | Status: DC
  Filled 2016-06-01: qty 1

## 2016-06-01 MED ORDER — SODIUM CHLORIDE 0.9% FLUSH: 3.0000 mL | INTRAVENOUS | Status: DC | PRN

## 2016-06-01 MED ORDER — ADULT MULTIVITAMIN W/MINERALS CH
1.0000 | ORAL_TABLET | Freq: Every day | ORAL | Status: DC
  Administered 2016-06-02: 1 via ORAL
  Filled 2016-06-01: qty 1

## 2016-06-01 MED ORDER — MORPHINE SULFATE (PF) 2 MG/ML IV SOLN: 1.0000 mg | INTRAVENOUS | Status: DC | PRN

## 2016-06-01 MED ORDER — TRAMADOL HCL 50 MG PO TABS: 50.0000 mg | ORAL_TABLET | Freq: Four times a day (QID) | ORAL | Status: DC | PRN

## 2016-06-01 NOTE — H&P (Signed)
History and Physical    Ebony Curtis [id number] DOB: 06-28-1960 DOA: 06/01/2016  PCP: Ann Held, DO   Patient coming from: Home, by way of [facility]  Chief Complaint: Chest pain   HPI: Ebony Curtis is a 56 y.o. female with medical history significant for hypertension, hyperlipidemia, obesity, tobacco abuse, GERD, and anxiety who presents in transfer from [facility] for evaluation of chest pain. Patient reports that she had been in her usual state of health until approximately one week ago when she developed rhinorrhea, sore throat, fevers and chills, and general malaise. She developed a cough around that same time which has persisted. The upper respiratory and systemic symptoms have essentially resolved, but the cough has persisted and is nonproductive. While at rest today, the patient noted a vague discomfort in her central chest described as a "tightness" and "pressure." Patient did not notice any change in her symptoms while moving about, but believes there may have been some positional component. She denies any associated shortness of breath, nausea, or diaphoresis. She had never experienced similar pain previously. She took some Delsym for her cough earlier today, but did not attempt any other interventions prior to coming in. Patient endorses a strong family history of early heart disease.   [facility] ED Course: Upon arrival to the [facility] ED, patient is found to be afebrile, saturating well on room air, mildly hypertensive, and with vitals otherwise stable. EKG features a normal sinus rhythm with incomplete right bundle branch block and a nonspecific T-wave abnormality inferiorly, with no prior for comparison. Chest x-ray is negative for acute cardiopulmonary disease, chemistry panel was unremarkable, CBC is within normal limits, and troponin is undetectable. Patient was given a dose of sublingual nitroglycerin which improved the chest discomfort. Chest discomfort  continued to improve in the emergency department and has resolved. Given the patient's ACS risk factors, she has been transferred to [facility] and will be observed on the telemetry unit for ongoing evaluation and management of chest pain, suspected secondary to pericarditis, musculoskeletal from coughing, or possibly ACS.  Review of Systems:  All other systems reviewed and apart from HPI, are negative.  Past Medical History:  Diagnosis Date  . Chronic lower back pain   . DVT (deep venous thrombosis) ([place]) "years ago"   RUE  . Family history of adverse reaction to anesthesia    "sister's heart went out doing routine OR; she never woke up" (06/01/2016)  . GERD (gastroesophageal reflux disease)   . Heart murmur    "when I was young"  . Hyperlipidemia   . Hypertension   . Migraines    "none in years" (06/01/2016)    Past Surgical History:  Procedure Laterality Date  . TONSILLECTOMY  1970s     reports that she has been smoking Cigarettes.  She has a 43.00 pack-year smoking history. She has never used smokeless tobacco. She reports that she drinks alcohol. She reports that she does not use drugs.  No Known Allergies  Family History  Problem Relation Age of Onset  . Diabetes Sister   . Heart disease Sister     HEART STOPPED  . Heart disease Father   . Arthritis Mother   . Heart disease Mother     CABG, PACER  . Coronary artery disease       Prior to Admission medications   Medication Sig Start Date End Date Taking? Authorizing Provider  Albuterol Sulfate (PROAIR RESPICLICK) [id number] (90 BASE) MCG/ACT AEPB Inhale 1  Inhaler into the lungs every 6 (six) hours as needed. 03/18/15   Rosalita Chessman Chase, DO  aspirin 81 MG tablet Take 81 mg by mouth daily.    Historical Provider, MD  diazepam (VALIUM) 5 MG tablet Take 1 tablet (5 mg total) by mouth every 6 (six) hours as needed. 09/30/15   Rosalita Chessman Chase, DO  diclofenac sodium (VOLTAREN) 1 % GEL Apply 2 g topically 4 (four)  times daily. Prn back pain 03/18/15   Rosalita Chessman Chase, DO  esomeprazole (NEXIUM) 40 MG capsule Take 1 capsule (40 mg total) by mouth daily before breakfast. 06/30/10   Rosalita Chessman Chase, DO  fish oil-omega-3 fatty acids 1000 MG capsule Take 1 g by mouth daily.      Historical Provider, MD  Multiple Vitamin (MULTIVITAMIN) tablet Take 1 tablet by mouth daily.      Historical Provider, MD  simvastatin (ZOCOR) 40 MG tablet TAKE 1 TABLET BY MOUTH DAILY 04/20/16   Rosalita Chessman Chase, DO  traMADol (ULTRAM) 50 MG tablet Take 1 tablet (50 mg total) by mouth every 6 (six) hours as needed. 09/30/15   Ann Held, DO    Physical Exam: Vitals:   06/01/16 1729 06/01/16 1749 06/01/16 1808 06/01/16 1941  BP: 141/83 128/70 132/99 (!) 147/65  Pulse: 67 71 80 64  Resp: 15 18 19    Temp:  98.1 F (36.7 C)  98.8 F (37.1 C)  TempSrc:  Oral  Oral  SpO2: 93% 91% 97% 95%  Weight:    112.7 kg (248 lb 6.4 oz)  Height:    5\' 5"  (1.651 m)      Constitutional: NAD, calm, comfortable. Obese Eyes: PERTLA, lids and conjunctivae normal ENMT: Mucous membranes are moist. Posterior pharynx clear of any exudate or lesions.   Neck: normal, supple, no masses, no thyromegaly Respiratory: clear to auscultation bilaterally, occasional expiratory wheeze. Normal respiratory effort.    Cardiovascular: S1 & S2 heard, regular rate and rhythm. No significant JVD. Abdomen: No distension, no tenderness, no masses palpated. Bowel sounds normal.  Musculoskeletal: no clubbing / cyanosis. No joint deformity upper and lower extremities. Normal muscle tone.  Skin: no significant rashes, lesions, ulcers. Warm, dry, well-perfused. Neurologic: CN 2-12 grossly intact. Sensation intact, DTR normal. Strength 5/5 in all 4 limbs.  Psychiatric: Normal judgment and insight. Alert and oriented x 3. Normal mood and affect.     Labs on Admission: I have personally reviewed following labs and imaging studies  CBC:  Recent  Labs Lab 06/01/16 1541  WBC 9.4  HGB 14.6  HCT 43.2  MCV 91.1  PLT [id number]   Basic Metabolic Panel:  Recent Labs Lab 06/01/16 1541  NA 139  K 4.4  CL 107  CO2 25  GLUCOSE 102*  BUN 9  CREATININE 0.89  CALCIUM 8.9   GFR: Estimated Creatinine Clearance: 88.4 mL/min (by C-G formula based on SCr of 0.89 mg/dL). Liver Function Tests: No results for input(s): AST, ALT, ALKPHOS, BILITOT, PROT, ALBUMIN in the last 168 hours. No results for input(s): LIPASE, AMYLASE in the last 168 hours. No results for input(s): AMMONIA in the last 168 hours. Coagulation Profile: No results for input(s): INR, PROTIME in the last 168 hours. Cardiac Enzymes:  Recent Labs Lab 06/01/16 1541  TROPONINI <0.03   BNP (last 3 results) No results for input(s): PROBNP in the last 8760 hours. HbA1C: No results for input(s): HGBA1C in the last 72 hours. CBG: No results for  input(s): GLUCAP in the last 168 hours. Lipid Profile: No results for input(s): CHOL, HDL, LDLCALC, TRIG, CHOLHDL, LDLDIRECT in the last 72 hours. Thyroid Function Tests: No results for input(s): TSH, T4TOTAL, FREET4, T3FREE, THYROIDAB in the last 72 hours. Anemia Panel: No results for input(s): VITAMINB12, FOLATE, FERRITIN, TIBC, IRON, RETICCTPCT in the last 72 hours. Urine analysis:    Component Value Date/Time   COLORURINE straw 04/23/2010 1111   APPEARANCEUR Cloudy 04/23/2010 1111   LABSPEC 1.015 04/23/2010 1111   PHURINE 6.0 04/23/2010 1111   GLUCOSEU NEGATIVE 11/05/2008 1240   HGBUR trace-lysed 04/23/2010 1111   BILIRUBINUR neg 04/02/2015 1406   KETONESUR 15 (A) 11/05/2008 1240   PROTEINUR trace 04/02/2015 1406   PROTEINUR NEGATIVE 11/05/2008 1240   UROBILINOGEN 0.2 04/02/2015 1406   UROBILINOGEN 0.2 04/23/2010 1111   NITRITE neg 04/02/2015 1406   NITRITE Negative 06/30/2010 1048   NITRITE negative 04/23/2010 1111   LEUKOCYTESUR Negative 04/02/2015 1406   Sepsis  Labs: @LABRCNTIP (procalcitonin:4,lacticidven:4) )No results found for this or any previous visit (from the past 240 hour(s)).   Radiological Exams on Admission: Dg Chest 2 View  Result Date: 06/01/2016 CLINICAL DATA:  Chest pain EXAM: CHEST  2 VIEW COMPARISON:  12/08/2011 FINDINGS: Heart and mediastinal contours are within normal limits. No focal opacities or effusions. No acute bony abnormality. IMPRESSION: No active cardiopulmonary disease. Electronically Signed   By: Rolm Baptise M.D.   On: 06/01/2016 16:01    EKG: Independently reviewed. Normal sinus rhythm, incomplete RBBB, non-specific T-wave abnormality inferiorly.   Assessment/Plan  1. Chest pain - Presents to [facility] with ~1hr central chest "tightness" and "pressure" - She reported improvement with SL NTG at the [facility] ED - Cardiac risk-factors include HLD, smoking, obesity, HTN, and FHx  - Initial EKG with incomplete RBBB and borderline T-wave abnormality inferiorly with no prior available  - Initial troponin is undetectable  - She was treated with ASA 324 mg on admission  - Plan to monitor on telemetry for ischemic changes, obtain serial cardiac biomarkers, repeat EKG's, check inflammatory markers for ?pericarditis, obtain TTE for ?pericarditis or WMA  - Continue daily ASA 81 and Zocor, beta-blocker prn HTN   2. URI  - Aside from dry cough, has nearly resolved  - Supportive care with APAP, prn nebs, Tessalon    3. Hyperlipidemia  - LDL 105, HDL 40 in December 2016  - Continue Zocor and Lovaza   4. Hypertension  - BP mildly elevated, not taking any antihypertensive at home - Treat with labetalol IVP's prn   5. GERD - No EGD report on file - Managed with daily Prilosec qD at home, will continue PPI therapy     6. Anxiety  - Appears to be stable - Continue current management with Valium prn    DVT prophylaxis: sq Lovenox  Code Status: Full  Family Communication: Discussed with patient Disposition Plan: Observe on  telemetry Consults called: None Admission status: Observation    Vianne Bulls, MD [facility] Pager [phone]  If 7PM-7AM, please contact night-coverage [email or web address] Password [facility]  06/01/2016, 10:33 PM

## 2016-06-01 NOTE — ED Triage Notes (Signed)
Chest tightness x 1 hour. She thought she had the flu last week. Cough last night.

## 2016-06-01 NOTE — ED Provider Notes (Signed)
[facility] DEPT [facility] Provider Note   CSN: [id number] Arrival date & time: 06/01/16  1516  By signing my name below, I, Dora Sims, attest that this documentation has been prepared under the direction and in the presence of physician practitioner, Merrily Pew, MD. Electronically Signed: Dora Sims, Scribe. 06/01/2016. 4:25 PM.  History   Chief Complaint Chief Complaint  Patient presents with  . Chest Pain    The history is provided by the patient. No language interpreter was used.     HPI Comments: Ebony Curtis is a 56 y.o. female with PMHx including HTN and HLD who presents to the Emergency Department complaining of sudden onset, constant, gradually improving, chest pain beginning a couple of hours ago. She states "it feels like someone is sitting on my chest." Patient reports she had flu-like symptoms last week including cough and nasal congestion; she notes she had associated pain in her ribs secondary to coughing and states her current chest pain is different. She notes she still has a cough which is improving. No h/o blood clot. No personal h/o DM. She has a FMHx of DM and heart disease and states her brother died of heart disease at age 53. She notes her father died of a heart attack and her mother wears a pacemaker. Pt is a regular smoker. She denies leg swelling, SOB, diaphoresis, fever, chills, nausea, vomiting, or any other associated symptoms.  Past Medical History:  Diagnosis Date  . Chronic lower back pain   . GERD (gastroesophageal reflux disease)   . Hyperlipidemia   . Hypertension   . Migraines     Patient Active Problem List   Diagnosis Date Noted  . Female incontinence 06/30/2010  . BACK PAIN, LUMBAR, WITH RADICULOPATHY 04/23/2010  . DYSURIA 04/23/2010  . DEPRESSION, MILD 11/12/2009  . ONYCHOMYCOSIS, BILATERAL 08/05/2009  . BACK PAIN, THORACIC REGION 07/22/2009  . BENIGN POSITIONAL VERTIGO 05/24/2009  . HEMATURIA, MICROSCOPIC, HX OF 05/24/2009    . LUMBAR STRAIN, ACUTE 09/20/2008  . ROTATOR CUFF INJURY, RIGHT SHOULDER 07/20/2008  . SHOULDER INJURY, RIGHT 07/20/2008  . HYPERTENSION 06/25/2008  . UNSPECIFIED VITAMIN D DEFICIENCY 09/26/2007  . HYPERLIPIDEMIA 07/28/2007  . TOBACCO USE 07/28/2007  . MIGRAINE 07/28/2007  . GERD 07/28/2007  . ELEVATED BLOOD PRESSURE 07/28/2007    Past Surgical History:  Procedure Laterality Date  . TONSILLECTOMY      OB History    No data available       Home Medications    Prior to Admission medications   Medication Sig Start Date End Date Taking? Authorizing Provider  Albuterol Sulfate (PROAIR RESPICLICK) [id number] (90 BASE) MCG/ACT AEPB Inhale 1 Inhaler into the lungs every 6 (six) hours as needed. 03/18/15   Rosalita Chessman Chase, DO  aspirin 81 MG tablet Take 81 mg by mouth daily.    Historical Provider, MD  chlorpheniramine-HYDROcodone (TUSSIONEX PENNKINETIC ER) 10-8 MG/5ML SUER Take 5 mLs by mouth every 12 (twelve) hours as needed for cough. 03/18/15   Rosalita Chessman Chase, DO  ciprofloxacin (CIPRO) 250 MG tablet Take 1 tablet (250 mg total) by mouth 2 (two) times daily. 04/13/15   Rosalita Chessman Chase, DO  diazepam (VALIUM) 5 MG tablet Take 1 tablet (5 mg total) by mouth every 6 (six) hours as needed. 09/30/15   Rosalita Chessman Chase, DO  diclofenac sodium (VOLTAREN) 1 % GEL Apply 2 g topically 4 (four) times daily. Prn back pain 03/18/15   Rosalita Chessman Chase, DO  esomeprazole ([place])  40 MG capsule Take 1 capsule (40 mg total) by mouth daily before breakfast. 06/30/10   Rosalita Chessman Chase, DO  fish oil-omega-3 fatty acids 1000 MG capsule Take 1 g by mouth daily.      Historical Provider, MD  metroNIDAZOLE (FLAGYL) 500 MG tablet Take 1 tablet (500 mg total) by mouth 2 (two) times daily. 04/10/15   Rosalita Chessman Chase, DO  Multiple Vitamin (MULTIVITAMIN) tablet Take 1 tablet by mouth daily.      Historical Provider, MD  simvastatin (ZOCOR) 40 MG tablet Take 1 tablet (40 mg total) by mouth daily.  04/02/15   Rosalita Chessman Chase, DO  simvastatin (ZOCOR) 40 MG tablet Take 1 tablet (40 mg total) by mouth daily. Due for a visit. 10/28/15   Rosalita Chessman Chase, DO  simvastatin (ZOCOR) 40 MG tablet TAKE 1 TABLET BY MOUTH DAILY 04/20/16   Rosalita Chessman Chase, DO  traMADol (ULTRAM) 50 MG tablet Take 1 tablet (50 mg total) by mouth every 6 (six) hours as needed. 09/30/15   Ann Held, DO    Family History Family History  Problem Relation Age of Onset  . Diabetes Sister   . Heart disease Sister     HEART STOPPED  . Heart disease Father   . Arthritis Mother   . Heart disease Mother     CABG, PACER  . Coronary artery disease      Social History Social History  Substance Use Topics  . Smoking status: Current Every Day Smoker    Packs/day: 1.00    Types: Cigarettes  . Smokeless tobacco: Never Used  . Alcohol use No     Allergies   Patient has no known allergies.   Review of Systems Review of Systems  Constitutional: Negative for chills, diaphoresis and fever.  HENT: Positive for congestion (resolved).   Respiratory: Positive for cough. Negative for shortness of breath.   Cardiovascular: Positive for chest pain. Negative for leg swelling.  Gastrointestinal: Negative for nausea and vomiting.  All other systems reviewed and are negative.    Physical Exam Updated Vital Signs BP 156/83 (BP Location: Right Arm)   Pulse 68   Temp 98.5 F (36.9 C) (Oral)   Resp 12   SpO2 96%   Physical Exam  Constitutional: She is oriented to person, place, and time. She appears well-developed and well-nourished. No distress.  HENT:  Head: Normocephalic and atraumatic.  Eyes: Conjunctivae and EOM are normal.  Neck: Neck supple. No tracheal deviation present.  Cardiovascular: Normal rate, regular rhythm, normal heart sounds and intact distal pulses.  Exam reveals no gallop and no friction rub.   No murmur heard. Pulmonary/Chest: Effort normal. No tachypnea. No respiratory  distress. She has decreased breath sounds. She has wheezes. She has no rales. She exhibits no tenderness.  Diffuse expiratory wheezes. Decreased breath sounds. No crackles or rales. No tachypnea or respiratory distress.  Abdominal: Soft. Bowel sounds are normal. There is no tenderness.  Musculoskeletal: Normal range of motion. She exhibits no edema or tenderness.  No leg swelling. No calf tenderness. No pain with dorsiflexion or plantar flexion bilaterally.  Neurological: She is alert and oriented to person, place, and time.  Skin: Skin is warm and dry. No rash noted.  Psychiatric: She has a normal mood and affect. Her behavior is normal.  Nursing note and vitals reviewed.   ED Treatments / Results  Labs (all labs ordered are listed, but only abnormal results are displayed)  Labs Reviewed  BASIC METABOLIC PANEL - Abnormal; Notable for the following:       Result Value   Glucose, Bld 102 (*)    All other components within normal limits  CBC  TROPONIN I    EKG  EKG Interpretation None       Radiology Dg Chest 2 View  Result Date: 06/01/2016 CLINICAL DATA:  Chest pain EXAM: CHEST  2 VIEW COMPARISON:  12/08/2011 FINDINGS: Heart and mediastinal contours are within normal limits. No focal opacities or effusions. No acute bony abnormality. IMPRESSION: No active cardiopulmonary disease. Electronically Signed   By: Rolm Baptise M.D.   On: 06/01/2016 16:01    Procedures Procedures (including critical care time)  DIAGNOSTIC STUDIES: Oxygen Saturation is 96% on RA, adequate by my interpretation.    COORDINATION OF CARE: 4:33 PM Discussed treatment plan with pt at bedside and pt agreed to plan.  Medications Ordered in ED Medications - No data to display   Initial Impression / Assessment and Plan / ED Course  I have reviewed the triage vital signs and the nursing notes.  Pertinent labs & imaging results that were available during my care of the patient were reviewed by me and  considered in my medical decision making (see chart for details).     Pericarditis vers ACS. Symptoms improved with NTG. High HEART 2/2 risk factors, pain description. Plan for admission to medicine for ACS rule out, possible echo to eval for pericarditis.   Final Clinical Impressions(s) / ED Diagnoses   Final diagnoses:  Chest pain, unspecified type    New Prescriptions New Prescriptions   No medications on file   I personally performed the services described in this documentation, which was scribed in my presence. The recorded information has been reviewed and is accurate.   Merrily Pew, MD 06/01/16 [phone]

## 2016-06-02 ENCOUNTER — Other Ambulatory Visit: Payer: Self-pay | Admitting: Student

## 2016-06-02 ENCOUNTER — Encounter (HOSPITAL_COMMUNITY): Payer: Self-pay | Admitting: Student

## 2016-06-02 ENCOUNTER — Ambulatory Visit (HOSPITAL_COMMUNITY): Payer: Federal, State, Local not specified - PPO

## 2016-06-02 DIAGNOSIS — R072 Precordial pain: Secondary | ICD-10-CM

## 2016-06-02 DIAGNOSIS — R079 Chest pain, unspecified: Secondary | ICD-10-CM

## 2016-06-02 DIAGNOSIS — E785 Hyperlipidemia, unspecified: Secondary | ICD-10-CM

## 2016-06-02 DIAGNOSIS — I10 Essential (primary) hypertension: Secondary | ICD-10-CM

## 2016-06-02 LAB — BASIC METABOLIC PANEL
Anion gap: 8 (ref 5–15)
BUN: 10 mg/dL (ref 6–20)
CALCIUM: 8.8 mg/dL — AB (ref 8.9–10.3)
CO2: 26 mmol/L (ref 22–32)
CREATININE: 0.94 mg/dL (ref 0.44–1.00)
Chloride: 106 mmol/L (ref 101–111)
GFR calc Af Amer: >60 mL/min (ref >60)
Glucose, Bld: 122 mg/dL — ABNORMAL HIGH (ref 65–99)
Potassium: 4.1 mmol/L (ref 3.5–5.1)
SODIUM: 140 mmol/L (ref 135–145)

## 2016-06-02 LAB — TROPONIN I

## 2016-06-02 LAB — HIV ANTIBODY (ROUTINE TESTING W REFLEX): HIV SCREEN 4TH GENERATION: NONREACTIVE

## 2016-06-02 MED ORDER — BENZONATATE 100 MG PO CAPS: 100.0000 mg | ORAL_CAPSULE | Freq: Three times a day (TID) | ORAL | 0 refills | Status: DC | PRN

## 2016-06-02 NOTE — Consult Note (Signed)
Cardiology Consult    Patient ID: Ebony Curtis MRN: [id number], DOB/AGE: Jan 03, 1961   Admit date: 06/01/2016 Date of Consult: 06/02/2016  Primary Physician: Ann Held, DO Reason for Consult: Chest Pain Primary Cardiologist: [facility] to [facility] - Dr. Percival Spanish Requesting Provider: Dr. Eliseo Squires   History of Present Illness    Ebony Curtis is a 56 y.o. female with past medical history of HLD, prior DVT, and family history of premature CAD who presented to [facility] on 06/02/2016 for evaluation of chest discomfort and was transferred to [facility] for further evaluation.   In talking with the patient today, she reports being diagnosed with the "flu" last week. Says she was not officially diagnosed and managed her symptoms with OTC medications. She had a fever, chills, and productive cough with this.   Her cough persisted and yesterday she noticed chest pressure with this coughing. Says her pressure was exacerbated by coughing. She went to [facility] to "have a CXR to make sure she did not have PNA". While there, she was given SL NTG which slightly improved her chest pressure. Pain lasted for over 3 hours. Denies any associated dyspnea with exertion, nausea, vomiting, or radiating pain.   She denies any recent exertional chest pain or dyspnea with exertion. She does not exercise regularly secondary to chronic back pain. Says she is unable to walk around the grocery store due to her back pain.   She denies any prior cardiac history. Does have a significant family history of CAD with father having a massive MI in his 26's and siblings having known CAD (diagnosed in their 30's). Reports smoking 1.0 ppd for 35+ years.   While admitted, labs have shown a WBC of 9.4, Hgb 14.6, and platelets 283. K+ 4.4. Creatinine 0.89. Cyclic troponin values have been negative. BNP 5.8. Sed rate and CRP are pending. EKG shows NSR, HR 74, with incomplete RBBB. CXR with no active cardiopulmonary disease.  She denies any  recurrent pain this admission. Is anxious to go home.    Past Medical History   Past Medical History:  Diagnosis Date  . Chronic lower back pain   . DVT (deep venous thrombosis) ([place]) "years ago"   RUE  . Family history of adverse reaction to anesthesia    "sister's heart went out doing routine OR; she never woke up" (06/01/2016)  . GERD (gastroesophageal reflux disease)   . Heart murmur    "when I was young"  . Hyperlipidemia   . Migraines    "none in years" (06/01/2016)    Past Surgical History:  Procedure Laterality Date  . TONSILLECTOMY  1970s     Allergies  No Known Allergies  Inpatient Medications    . aspirin  81 mg Oral Daily  . diclofenac sodium  2 g Topical QID  . enoxaparin (LOVENOX) injection  40 mg Subcutaneous QHS  . multivitamin with minerals  1 tablet Oral Daily  . omega-3 acid ethyl esters  1 g Oral Daily  . pantoprazole  40 mg Oral Daily  . simvastatin  40 mg Oral q1800  . sodium chloride flush  3 mL Intravenous Q12H    Family History    Family History  Problem Relation Age of Onset  . Diabetes Sister 63  . Heart disease Sister   . Heart disease Father 35  . Arthritis Mother   . Heart disease Mother     CABG, PACER  . Coronary artery disease      Social History  Social History   Social History  . Marital status: Married    Spouse name: N/A  . Number of children: N/A  . Years of education: N/A   Occupational History  . [employer]    disability/ retired   Social History Main Topics  . Smoking status: Current Every Day Smoker    Packs/day: 1.00    Years: 35.00    Types: Cigarettes  . Smokeless tobacco: Never Used  . Alcohol use Yes     Comment: 06/01/2016 "last drink was 10/2015"  . Drug use: No  . Sexual activity: Yes    Partners: Male   Other Topics Concern  . Not on file   Social History Narrative   Exercise-- walk     Review of Systems    General:  No chills, fever, night sweats or weight  changes.  Cardiovascular:  No dyspnea on exertion, edema, orthopnea, palpitations, paroxysmal nocturnal dyspnea. Positive for chest pressure.  Dermatological: No rash, lesions/masses Respiratory: Positive for dry cough Urologic: No hematuria, dysuria Abdominal:   No nausea, vomiting, diarrhea, bright red blood per rectum, melena, or hematemesis Neurologic:  No visual changes, wkns, changes in mental status. All other systems reviewed and are otherwise negative except as noted above.  Physical Exam    Blood pressure (!) 158/78, pulse 76, temperature 98.6 F (37 C), temperature source Oral, resp. rate 19, height 5\' 5"  (1.651 m), weight 246 lb 12.8 oz (111.9 kg), SpO2 96 %.  General: Pleasant, overweight African American female appearing in NAD Psych: Normal affect. Neuro: Alert and oriented X 3. Moves all extremities spontaneously. HEENT: Normal  Neck: Supple without bruits or JVD. Lungs:  Resp regular and unlabored, CTA without wheezing or rales. Heart: RRR no s3, s4, or murmurs. Abdomen: Soft, non-tender, non-distended, BS + x 4.  Extremities: No clubbing, cyanosis or edema. DP/PT/Radials 2+ and equal bilaterally.  Labs    Troponin (Point of Care Test) No results for input(s): TROPIPOC in the last 72 hours.  Recent Labs  06/01/16 1541 06/01/16 2241 06/02/16 0425  TROPONINI <0.03 <0.03 <0.03   Lab Results  Component Value Date   WBC 9.4 06/01/2016   HGB 14.6 06/01/2016   HCT 43.2 06/01/2016   MCV 91.1 06/01/2016   PLT 283 06/01/2016     Recent Labs Lab 06/02/16 0425  NA 140  K 4.1  CL 106  CO2 26  BUN 10  CREATININE 0.94  CALCIUM 8.8*  GLUCOSE 122*   Lab Results  Component Value Date   CHOL 171 04/02/2015   HDL 40.30 04/02/2015   LDLCALC 105 (H) 04/02/2015   TRIG 124.0 04/02/2015   No results found for: [facility]   Radiology Studies    Dg Chest 2 View  Result Date: 06/01/2016 CLINICAL DATA:  Chest pain EXAM: CHEST  2 VIEW COMPARISON:  12/08/2011  FINDINGS: Heart and mediastinal contours are within normal limits. No focal opacities or effusions. No acute bony abnormality. IMPRESSION: No active cardiopulmonary disease. Electronically Signed   By: Rolm Baptise M.D.   On: 06/01/2016 16:01    EKG & Cardiac Imaging    EKG: NSR, HR 74, with incomplete RBBB. - Personally Reviewed  Echocardiogram: Pending  Assessment & Plan    1. Atypical Chest Pain - reports having the "flu" one week ago and developing a cough. Her cough persisted and on 2/26, she developed chest pressure associated with her cough. Pain sounds pleuritic in etiology. Denies any associated dyspnea with  exertion, nausea, vomiting, or radiating pain. Pain improved with SL NTG.  - cyclic troponin values have been negative. EKG shows NSR, HR 74, with incomplete RBBB. CXR with no active cardiopulmonary disease. - she has no known history of CAD but does have multiple risk factors including HLD, tobacco use, and extensive family history of CAD (father with massive MI at age 8, brothers and sisters with known CAD).  - while her recent episode of chest pain sounds atypical for a cardiac etiology, she would benefit from an ischemic evaluation given her risk factors. She already consumed breakfast this AM. Echo already ordered by admitting team. Would recommend [facility] as an outpatient if echo without significant abnormalities. Unable to walk on a treadmill due to significant back pain with ambulation.   2. HLD - on Simvastatin 40mg  daily PTA.   3. Tobacco Use - cessation advised.   Signed, Erma Heritage, PA-C 06/02/2016, 10:27 AM Pager: [phone]  History and all data above reviewed.  Patient examined.  I agree with the findings as above.  Chest pain has happened with coughing.  No objective evidence of ischemia.  No prior history. The patient exam reveals COR:RRR  ,  Lungs: Clear  ,  Abd: Positive bowel sounds, no rebound no guarding, Ext No edema  .  All  available labs, radiology testing, previous records reviewed. Agree with documented assessment and plan. Chest pain is atypical:  However, she has risk factors.  OK for out patient stress test.  She would not be able to walk on a treadmill and will have a Lexiscan Myoview.  Jeneen Rinks Dirk Vanaman  11:02 AM  06/02/2016

## 2016-06-02 NOTE — Discharge Instructions (Addendum)
Chest Wall Pain Chest wall pain is pain in or around the bones and muscles of your chest. Sometimes, an injury causes this pain. Sometimes, the cause may not be known. This pain may take several weeks or longer to get better. Follow these instructions at home: Pay attention to any changes in your symptoms. Take these actions to help with your pain:  Rest as told by your doctor.  Avoid activities that cause pain. Try not to use your chest, belly (abdominal), or side muscles to lift heavy things.  If directed, apply ice to the painful area:  Put ice in a plastic bag.  Place a towel between your skin and the bag.  Leave the ice on for 20 minutes, 2-3 times per day.  Take over-the-counter and prescription medicines only as told by your doctor.  Do not use tobacco products, including cigarettes, chewing tobacco, and e-cigarettes. If you need help quitting, ask your doctor.  Keep all follow-up visits as told by your doctor. This is important. Contact a doctor if:  You have a fever.  Your chest pain gets worse.  You have new symptoms. Get help right away if:  You feel sick to your stomach (nauseous) or you throw up (vomit).  You feel sweaty or light-headed.  You have a cough with phlegm (sputum) or you cough up blood.  You are short of breath. This information is not intended to replace advice given to you by your health care provider. Make sure you discuss any questions you have with your health care provider. Document Released: 09/09/2007 Document Revised: 08/29/2015 Document Reviewed: 06/18/2014 Elsevier Interactive Patient Education  2017 [place] have a Stress Test scheduled at [facility]. Your doctor has ordered this test to check the blood flow in your heart arteries.   Please arrive 15 minutes early for paperwork. The whole test will take several hours. You may want to bring reading material to remain occupied while undergoing  different parts of the test.  Instructions:  No food/drink after midnight the night before.  It is OK to take your morning meds with a sip of water EXCEPT for those types of medicines listed below or otherwise instructed.  No caffeine/decaf products 24 hours before, including medicines such as Excedrin or Goody Powders. Call if there are any questions.   Wear comfortable clothes and shoes.   Special Medication Instructions:  Beta blockers such as metoprolol (Lopressor/Toprol XL), atenolol (Tenormin), carvedilol (Coreg), nebivolol (Bystolic), bisoprolol (Zebeta), propranolol (Inderal) should not be taken for 24 hours before the test.  Calcium channel blockers such as diltiazem (Cardizem) or verapmil (Calan) should not be taken for 24 hours before the test.  Remove nitroglycerin patches and do not take nitrate preparations such as Imdur/isosorbide the day of your test.  No Persantine/Theophylline or Aggrenox medicines should be used within 24 hours of the test.   If you are diabetic, please ask which medications to hold the day of the test.  What To Expect: When you arrive in the lab, the technician will inject a small amount of radioactive tracer into your arm through an IV while you are resting quietly. This helps [place] to form pictures of your heart. You will likely only feel a sting from the IV. After a waiting period, resting pictures will be obtained under a big camera. These are the "before" pictures.  Next, you will be prepped for the stress portion of the test. This may include either walking  on a treadmill or receiving a medicine that helps to dilate blood vessels in your heart to simulate the effect of exercise on your heart. If you are walking on a treadmill, you will walk at different paces to try to get your heart rate to a goal number that is based on your age. If your doctor has chosen the pharmacologic test, then you will receive a medicine through your IV that may cause  temporary nausea, flushing, shortness of breath and sometimes chest discomfort or vomiting. This is typically short-lived and usually resolves quickly. If you experience symptoms, that does not automatically mean the test is abnormal. Some patients do not experience any symptoms at all. Your blood pressure and heart rate will be monitored, and we will be watching your EKG on a computer screen for any changes. During this portion of the test, the radiologist will inject another small amount of radioactive tracer into your IV. After a waiting period, you will undergo a second set of pictures. These are the "after" pictures.  The doctor reading the test will compare the before-and-after images to look for evidence of heart blockages or heart weakness. The test usually takes 1 day to complete, but in certain instances (for example, if a patient is over a certain weight limit), the test may be done over the span of 2 days.

## 2016-06-02 NOTE — Progress Notes (Signed)
Dr. Percival Spanish states patient echo can be cancelled.  Echo not showing on the active orders list.  I called down to echo department and notified them.  Dr. Eliseo Squires text paged and notified.  Sanda Linger

## 2016-06-02 NOTE — Discharge Summary (Signed)
Physician Discharge Summary  Ebony Curtis [id number] DOB: Dec 25, 1960 DOA: 06/01/2016  PCP: Ann Held, DO  Admit date: 06/01/2016 Discharge date: 06/02/2016   Recommendations for Outpatient Follow-Up:   1. Smoking cessation 2. Outpatient stress test   Discharge Diagnosis:   Principal Problem:   Chest pain Active Problems:   HLD (hyperlipidemia)   Depression with anxiety   Essential hypertension   GERD   Discharge disposition:  Home.  Discharge Condition: Improved.  Diet recommendation: Low sodium, heart healthy  Wound care: None.   History of Present Illness:   Ebony Curtis is a 56 y.o. female with medical history significant for hypertension, hyperlipidemia, obesity, tobacco abuse, GERD, and anxiety who presents in transfer from [facility] for evaluation of chest pain. Patient reports that she had been in her usual state of health until approximately one week ago when she developed rhinorrhea, sore throat, fevers and chills, and general malaise. She developed a cough around that same time which has persisted. The upper respiratory and systemic symptoms have essentially resolved, but the cough has persisted and is nonproductive. While at rest today, the patient noted a vague discomfort in her central chest described as a "tightness" and "pressure." Patient did not notice any change in her symptoms while moving about, but believes there may have been some positional component. She denies any associated shortness of breath, nausea, or diaphoresis. She had never experienced similar pain previously. She took some Delsym for her cough earlier today, but did not attempt any other interventions prior to coming in. Patient endorses a strong family history of early heart disease.    Hospital Course by Problem:   Chest pain -CE negative -seen by cards: outpatient stress test  URI -resolving  HLD -continue zocor  HTN Resume home  meds -titration as outpatient  Tobacco abuse -encourage cessation   Medical Consultants:    cards   Discharge Exam:   Vitals:   06/01/16 1941 06/02/16 0500  BP: (!) 147/65 (!) 158/78  Pulse: 64 76  Resp:    Temp: 98.8 F (37.1 C) 98.6 F (37 C)   Vitals:   06/01/16 1749 06/01/16 1808 06/01/16 1941 06/02/16 0500  BP: 128/70 132/99 (!) 147/65 (!) 158/78  Pulse: 71 80 64 76  Resp: 18 19    Temp: 98.1 F (36.7 C)  98.8 F (37.1 C) 98.6 F (37 C)  TempSrc: Oral  Oral Oral  SpO2: 91% 97% 95% 96%  Weight:   112.7 kg (248 lb 6.4 oz) 111.9 kg (246 lb 12.8 oz)  Height:   5\' 5"  (1.651 m)     Gen:  NAD    The results of significant diagnostics from this hospitalization (including imaging, microbiology, ancillary and laboratory) are listed below for reference.     Procedures and Diagnostic Studies:   Dg Chest 2 View  Result Date: 06/01/2016 CLINICAL DATA:  Chest pain EXAM: CHEST  2 VIEW COMPARISON:  12/08/2011 FINDINGS: Heart and mediastinal contours are within normal limits. No focal opacities or effusions. No acute bony abnormality. IMPRESSION: No active cardiopulmonary disease. Electronically Signed   By: Rolm Baptise M.D.   On: 06/01/2016 16:01     Labs:   Basic Metabolic Panel:  Recent Labs Lab 06/01/16 1541 06/02/16 0425  NA 139 140  K 4.4 4.1  CL 107 106  CO2 25 26  GLUCOSE 102* 122*  BUN 9 10  CREATININE 0.89 0.94  CALCIUM 8.9 8.8*   GFR Estimated Creatinine  Clearance: 83.3 mL/min (by C-G formula based on SCr of 0.94 mg/dL). Liver Function Tests: No results for input(s): AST, ALT, ALKPHOS, BILITOT, PROT, ALBUMIN in the last 168 hours. No results for input(s): LIPASE, AMYLASE in the last 168 hours. No results for input(s): AMMONIA in the last 168 hours. Coagulation profile No results for input(s): INR, PROTIME in the last 168 hours.  CBC:  Recent Labs Lab 06/01/16 1541  WBC 9.4  HGB 14.6  HCT 43.2  MCV 91.1  PLT 283   Cardiac  Enzymes:  Recent Labs Lab 06/01/16 1541 06/01/16 2241 06/02/16 0425  TROPONINI <0.03 <0.03 <0.03   BNP: Invalid input(s): POCBNP CBG: No results for input(s): GLUCAP in the last 168 hours. D-Dimer No results for input(s): DDIMER in the last 72 hours. Hgb A1c No results for input(s): HGBA1C in the last 72 hours. Lipid Profile No results for input(s): CHOL, HDL, LDLCALC, TRIG, CHOLHDL, LDLDIRECT in the last 72 hours. Thyroid function studies No results for input(s): TSH, T4TOTAL, T3FREE, THYROIDAB in the last 72 hours.  Invalid input(s): FREET3 Anemia work up No results for input(s): VITAMINB12, FOLATE, FERRITIN, TIBC, IRON, RETICCTPCT in the last 72 hours. Microbiology No results found for this or any previous visit (from the past 240 hour(s)).   Discharge Instructions:   Discharge Instructions    Diet - low sodium heart healthy    Complete by:  As directed    Discharge instructions    Complete by:  As directed    Outpatient stress test   Increase activity slowly    Complete by:  As directed      Allergies as of 06/02/2016   No Known Allergies     Medication List    TAKE these medications   Albuterol Sulfate 108 (90 Base) MCG/ACT Aepb Commonly known as:  PROAIR RESPICLICK Inhale 1 Inhaler into the lungs every 6 (six) hours as needed.   aspirin 81 MG tablet Take 81 mg by mouth daily.   benzonatate 100 MG capsule Commonly known as:  TESSALON Take 1 capsule (100 mg total) by mouth 3 (three) times daily as needed for cough.   CENTRUM SILVER 50+WOMEN Tabs Take 1 tablet by mouth daily.   cetirizine 10 MG tablet Commonly known as:  ZYRTEC Take 10 mg by mouth daily as needed for allergies.   diazepam 5 MG tablet Commonly known as:  VALIUM Take 1 tablet (5 mg total) by mouth every 6 (six) hours as needed. What changed:  reasons to take this   diclofenac sodium 1 % Gel Commonly known as:  VOLTAREN Apply 2 g topically 4 (four) times daily. Prn back  pain What changed:  when to take this  reasons to take this  additional instructions   esomeprazole 40 MG capsule Commonly known as:  NEXIUM Take 1 capsule (40 mg total) by mouth daily before breakfast.   GARLIC PO Take 1 capsule by mouth daily.   guaiFENesin 100 MG/5ML Soln Commonly known as:  ROBITUSSIN Take 10 mLs by mouth every 4 (four) hours as needed for cough or to loosen phlegm.   ibuprofen 200 MG tablet Commonly known as:  ADVIL,MOTRIN Take 400 mg by mouth every 6 (six) hours as needed (for back pain).   simvastatin 40 MG tablet Commonly known as:  ZOCOR TAKE 1 TABLET BY MOUTH DAILY   traMADol 50 MG tablet Commonly known as:  ULTRAM Take 1 tablet (50 mg total) by mouth every 6 (six) hours as needed. What changed:  reasons to take this   vitamin E 400 UNIT capsule Generic drug:  vitamin E Take 400 Units by mouth daily.         Time coordinating discharge: 31 min  Signed:  Brysen Shankman U Lon Klippel   [facility] 06/02/2016, 11:15 AM

## 2016-06-03 ENCOUNTER — Telehealth (HOSPITAL_COMMUNITY): Payer: Self-pay

## 2016-06-03 NOTE — Telephone Encounter (Signed)
Encounter complete. 

## 2016-06-04 ENCOUNTER — Ambulatory Visit (HOSPITAL_COMMUNITY)
Admission: RE | Admit: 2016-06-04 | Discharge: 2016-06-04 | Disposition: A | Discharge status: Home / Self Care | Payer: Federal, State, Local not specified - PPO | Source: Ambulatory Visit | Attending: Cardiology | Admitting: Cardiology

## 2016-06-04 DIAGNOSIS — R079 Chest pain, unspecified: Secondary | ICD-10-CM | POA: Diagnosis not present

## 2016-06-04 MED ORDER — AMINOPHYLLINE 25 MG/ML IV SOLN
100.0000 mg | Freq: Once | INTRAVENOUS | Status: AC
  Administered 2016-06-04: 100 mg via INTRAVENOUS

## 2016-06-04 MED ORDER — REGADENOSON 0.4 MG/5ML IV SOLN
0.4000 mg | Freq: Once | INTRAVENOUS | Status: AC
  Administered 2016-06-04: 0.4 mg via INTRAVENOUS

## 2016-06-04 MED ORDER — TECHNETIUM TC 99M TETROFOSMIN IV KIT
29.2000 | PACK | Freq: Once | INTRAVENOUS | Status: AC | PRN
  Administered 2016-06-04: 29.2 via INTRAVENOUS
  Filled 2016-06-04: qty 30

## 2016-06-05 ENCOUNTER — Encounter: Payer: Self-pay | Admitting: Cardiology

## 2016-06-05 ENCOUNTER — Ambulatory Visit (HOSPITAL_COMMUNITY)
Admit: 2016-06-05 | Discharge: 2016-06-05 | Disposition: A | Discharge status: Home / Self Care | Payer: Federal, State, Local not specified - PPO | Attending: Cardiovascular Disease | Admitting: Cardiovascular Disease

## 2016-06-05 LAB — MYOCARDIAL PERFUSION IMAGING
CHL CUP RESTING HR STRESS: 71 {beats}/min
LV dias vol: 73 mL (ref 46–106)
LVSYSVOL: 26 mL
Peak HR: 103 {beats}/min
SDS: 2
SRS: 1
SSS: 2
TID: 0.91

## 2016-06-05 MED ORDER — TECHNETIUM TC 99M TETROFOSMIN IV KIT
30.1000 | PACK | Freq: Once | INTRAVENOUS | Status: AC | PRN
  Administered 2016-06-05: 30.1 via INTRAVENOUS

## 2016-06-08 ENCOUNTER — Telehealth: Payer: Self-pay | Admitting: Cardiology

## 2016-06-08 NOTE — Telephone Encounter (Signed)
New message     Patient calling back stating someone called her today did not know the nature of call.

## 2016-06-08 NOTE — Telephone Encounter (Signed)
Returned call and advised on low-risk stress test. Also confirmed upcoming OV. Pt voiced understanding of findings and thanks for call. No further needs identified at this time.

## 2016-06-15 ENCOUNTER — Ambulatory Visit: Payer: Federal, State, Local not specified - PPO | Admitting: Cardiology

## 2016-07-14 ENCOUNTER — Other Ambulatory Visit: Payer: Self-pay | Admitting: Family Medicine

## 2016-07-16 ENCOUNTER — Encounter: Payer: Self-pay | Admitting: Family Medicine

## 2016-07-16 ENCOUNTER — Ambulatory Visit (INDEPENDENT_AMBULATORY_CARE_PROVIDER_SITE_OTHER): Payer: Federal, State, Local not specified - PPO | Admitting: Family Medicine

## 2016-07-16 VITALS — BP 138/80 | HR 78 | Temp 98.7°F | Ht 61.0 in | Wt 252.4 lb

## 2016-07-16 DIAGNOSIS — J209 Acute bronchitis, unspecified: Secondary | ICD-10-CM

## 2016-07-16 MED ORDER — FLUTICASONE PROPIONATE 50 MCG/ACT NA SUSP: 2.0000 | Freq: Every day | NASAL | 1 refills | Status: DC

## 2016-07-16 MED ORDER — BENZONATATE 100 MG PO CAPS: 100.0000 mg | ORAL_CAPSULE | Freq: Three times a day (TID) | ORAL | 0 refills | Status: DC | PRN

## 2016-07-16 MED ORDER — SIMVASTATIN 40 MG PO TABS: 40.0000 mg | ORAL_TABLET | Freq: Every day | ORAL | 1 refills | Status: DC

## 2016-07-16 NOTE — Progress Notes (Signed)
Pre visit review using our clinic review tool, if applicable. No additional management support is needed unless otherwise documented below in the visit note. 

## 2016-07-16 NOTE — Patient Instructions (Signed)
Continue to push fluids, practice good hand hygiene, and cover your mouth if you cough.  If you start having fevers, shaking or shortness of breath, seek immediate care.  This can last between 1-3 weeks. Be diligent!

## 2016-07-16 NOTE — Progress Notes (Signed)
Chief Complaint  Patient presents with  . Cough    x 1 week-dry and product-clear and thick  . Medication Refill    Zocor    Ebony Curtis here for URI complaints.  Duration: 1 week  Associated symptoms: sinus congestion, rhinorrhea, initial ST, cough-sometimes productive Denies: sinus pain, itchy watery eyes, ear pain, ear drainage, shortness of breath, myalgia and fevers/rigors Treatment to date: Zyrtec, Delsum, Alka-selzter cold plus, robitussum DM Sick contacts: No  ROS:  Const: Denies fevers HEENT: As noted in HPI Lungs: No SOB  Past Medical History:  Diagnosis Date  . Chronic lower back pain   . DVT (deep venous thrombosis) ([place]) "years ago"   RUE  . Family history of adverse reaction to anesthesia    "sister's heart went out doing routine OR; she never woke up" (06/01/2016)  . GERD (gastroesophageal reflux disease)   . Heart murmur    "when I was young"  . Hyperlipidemia   . Migraines    "none in years" (06/01/2016)   Family History  Problem Relation Age of Onset  . Diabetes Sister 44  . Heart disease Sister   . Heart disease Father 71  . Arthritis Mother   . Heart disease Mother     CABG, PACER  . Coronary artery disease      BP 138/80 (BP Location: Right Arm, Patient Position: Sitting, Cuff Size: Large)   Pulse 78   Temp 98.7 F (37.1 C) (Oral)   Ht 5\' 1"  (1.549 m)   Wt 252 lb 6.4 oz (114.5 kg)   SpO2 97%   BMI 47.69 kg/m  General: Awake, alert, appears stated age HEENT: AT, [place], ears patent b/l and TM's neg, nares patent w/o discharge, pharynx pink and without exudates, MMM Neck: No masses or asymmetry Heart: RRR, no murmurs, no bruits Lungs: CTAB, no accessory muscle use Psych: Age appropriate judgment and insight, normal mood and affect  Acute bronchitis, unspecified organism - Plan: benzonatate (TESSALON) 100 MG capsule, fluticasone (FLONASE) 50 MCG/ACT nasal spray  Orders as above. Discussed how vast majority of cases of bronchitis are  viral in nature given her lack of fevers/rigors/abnormal breath sounds. Cont Zyrtec . Continue to push fluids, practice good hand hygiene, cover mouth when coughing. F/u with Dr Carollee Herter for cholesterol visit at earliest convenience. If starting to experience fevers, shaking, or shortness of breath, seek immediate care. Pt voiced understanding and agreement to the plan.  Keller, DO 07/16/16 10:34 AM

## 2016-07-28 ENCOUNTER — Ambulatory Visit (INDEPENDENT_AMBULATORY_CARE_PROVIDER_SITE_OTHER): Payer: Federal, State, Local not specified - PPO | Admitting: Family Medicine

## 2016-07-28 ENCOUNTER — Encounter: Payer: Self-pay | Admitting: Family Medicine

## 2016-07-28 VITALS — BP 142/78 | HR 88 | Temp 98.1°F | Resp 16 | Ht 61.0 in | Wt 254.6 lb

## 2016-07-28 DIAGNOSIS — E782 Mixed hyperlipidemia: Secondary | ICD-10-CM

## 2016-07-28 DIAGNOSIS — I10 Essential (primary) hypertension: Secondary | ICD-10-CM | POA: Diagnosis not present

## 2016-07-28 LAB — COMPREHENSIVE METABOLIC PANEL
ALK PHOS: 66 U/L (ref 39–117)
ALT: 17 U/L (ref 0–35)
AST: 17 U/L (ref 0–37)
Albumin: 4.1 g/dL (ref 3.5–5.2)
BILIRUBIN TOTAL: 0.3 mg/dL (ref 0.2–1.2)
BUN: 9 mg/dL (ref 6–23)
CALCIUM: 9.7 mg/dL (ref 8.4–10.5)
CO2: 30 mEq/L (ref 19–32)
Chloride: 103 mEq/L (ref 96–112)
Creatinine, Ser: 0.79 mg/dL (ref 0.40–1.20)
GFR: 96.7 mL/min (ref >60.00)
Glucose, Bld: 99 mg/dL (ref 70–99)
Potassium: 4.3 mEq/L (ref 3.5–5.1)
Sodium: 138 mEq/L (ref 135–145)
TOTAL PROTEIN: 7.8 g/dL (ref 6.0–8.3)

## 2016-07-28 LAB — LIPID PANEL
Cholesterol: 164 mg/dL (ref 0–200)
HDL: 42.2 mg/dL (ref >39.00)
LDL Cholesterol: 99 mg/dL (ref 0–99)
NONHDL: 121.34
Total CHOL/HDL Ratio: 4
Triglycerides: 113 mg/dL (ref 0.0–149.0)
VLDL: 22.6 mg/dL (ref 0.0–40.0)

## 2016-07-28 MED ORDER — DIAZEPAM 5 MG PO TABS: 5.0000 mg | ORAL_TABLET | Freq: Four times a day (QID) | ORAL | 0 refills | Status: DC | PRN

## 2016-07-28 MED ORDER — TRAMADOL HCL 50 MG PO TABS
50.0000 mg | ORAL_TABLET | Freq: Four times a day (QID) | ORAL | 0 refills | Status: DC | PRN
Start: 2016-07-28 — End: 2018-03-07

## 2016-07-28 NOTE — Assessment & Plan Note (Signed)
Well controlled, no changes to meds. Encouraged heart healthy diet such as the DASH diet and exercise as tolerated.  °

## 2016-07-28 NOTE — Assessment & Plan Note (Addendum)
Encouraged heart healthy diet, increase exercise, avoid trans fats, consider a krill oil cap daily 

## 2016-07-28 NOTE — Patient Instructions (Signed)

## 2016-07-28 NOTE — Progress Notes (Signed)
Subjective:  I acted as a Education administrator for Dr. Royden Purl, LPN    Patient ID: Ebony Curtis, female    DOB: 03-06-1961, 57 y.o.   MRN: [id number]  Chief Complaint  Patient presents with  . Follow-up    HPI  Patient is in today for follow up acute bronchitis, and to refill medication. Patient state she has no questions or concerns at this time.   Patient Care Team: Ann Held, DO as PCP - General   Past Medical History:  Diagnosis Date  . Chronic lower back pain   . DVT (deep venous thrombosis) ([place]) "years ago"   RUE  . Family history of adverse reaction to anesthesia    "sister's heart went out doing routine OR; she never woke up" (06/01/2016)  . GERD (gastroesophageal reflux disease)   . Heart murmur    "when I was young"  . Hyperlipidemia   . Migraines    "none in years" (06/01/2016)    Past Surgical History:  Procedure Laterality Date  . TONSILLECTOMY  1970s    Family History  Problem Relation Age of Onset  . Diabetes Sister 12  . Heart disease Sister   . Heart disease Father 59  . Arthritis Mother   . Heart disease Mother     CABG, PACER  . Coronary artery disease      Social History   Social History  . Marital status: Married    Spouse name: N/A  . Number of children: N/A  . Years of education: N/A   Occupational History  . [employer]    disability/ retired   Social History Main Topics  . Smoking status: Current Every Day Smoker    Packs/day: 1.00    Years: 35.00    Types: Cigarettes  . Smokeless tobacco: Never Used  . Alcohol use Yes     Comment: 06/01/2016 "last drink was 10/2015"  . Drug use: No  . Sexual activity: Yes    Partners: Male   Other Topics Concern  . Not on file   Social History Narrative   Exercise-- walk    Outpatient Medications Prior to Visit  Medication Sig Dispense Refill  . Albuterol Sulfate (PROAIR RESPICLICK) 371 (90 BASE) MCG/ACT AEPB Inhale 1 Inhaler into the lungs every 6  (six) hours as needed. 1 each 1  . aspirin 81 MG tablet Take 81 mg by mouth daily.    . benzonatate (TESSALON) 100 MG capsule Take 1 capsule (100 mg total) by mouth 3 (three) times daily as needed. 30 capsule 0  . cetirizine (ZYRTEC) 10 MG tablet Take 10 mg by mouth daily as needed for allergies.    Marland Kitchen diclofenac sodium (VOLTAREN) 1 % GEL Apply 2 g topically 4 (four) times daily. Prn back pain (Patient taking differently: Apply 2 g topically 4 (four) times daily as needed (for back pain). ) 100 g 5  . esomeprazole (NEXIUM) 40 MG capsule Take 1 capsule (40 mg total) by mouth daily before breakfast. 90 capsule 3  . fluticasone (FLONASE) 50 MCG/ACT nasal spray Place 2 sprays into both nostrils daily. 16 g 1  . GARLIC PO Take 1 capsule by mouth daily.    Marland Kitchen guaiFENesin (ROBITUSSIN) 100 MG/5ML SOLN Take 10 mLs by mouth every 4 (four) hours as needed for cough or to loosen phlegm.    Marland Kitchen ibuprofen (ADVIL,MOTRIN) 200 MG tablet Take 400 mg by mouth every 6 (six) hours as needed (for back  pain).    . Multiple Vitamins-Minerals (CENTRUM SILVER 50+WOMEN) TABS Take 1 tablet by mouth daily.    . simvastatin (ZOCOR) 40 MG tablet Take 1 tablet (40 mg total) by mouth daily. 90 tablet 1  . vitamin E (VITAMIN E) 400 UNIT capsule Take 400 Units by mouth daily.    . diazepam (VALIUM) 5 MG tablet Take 1 tablet (5 mg total) by mouth every 6 (six) hours as needed. (Patient taking differently: Take 5 mg by mouth every 6 (six) hours as needed for anxiety. ) 30 tablet 0  . traMADol (ULTRAM) 50 MG tablet Take 1 tablet (50 mg total) by mouth every 6 (six) hours as needed. (Patient taking differently: Take 50 mg by mouth every 6 (six) hours as needed (for back pain). ) 30 tablet 0   No facility-administered medications prior to visit.     No Known Allergies  Review of Systems  Constitutional: Negative for fever.  HENT: Negative for congestion.   Eyes: Negative for blurred vision.  Respiratory: Negative for cough.     Cardiovascular: Negative for chest pain and palpitations.  Gastrointestinal: Negative for vomiting.  Musculoskeletal: Negative for back pain.  Skin: Negative for rash.  Neurological: Negative for loss of consciousness and headaches.       Objective:    Physical Exam  Constitutional: She is oriented to person, place, and time. She appears well-developed and well-nourished. No distress.  HENT:  Head: Normocephalic and atraumatic.  Eyes: Conjunctivae are normal. Pupils are equal, round, and reactive to light.  Neck: Normal range of motion. No thyromegaly present.  Cardiovascular: Normal rate and regular rhythm.   Pulmonary/Chest: Effort normal and breath sounds normal. She has no wheezes.  Abdominal: Soft. Bowel sounds are normal. There is no tenderness.  Musculoskeletal: Normal range of motion. She exhibits no edema or deformity.  Neurological: She is alert and oriented to person, place, and time.  Skin: Skin is warm and dry. She is not diaphoretic.  Psychiatric: She has a normal mood and affect.    BP (!) 142/78 (BP Location: Left Arm, Patient Position: Sitting, Cuff Size: Large)   Pulse 88   Temp 98.1 F (36.7 C) (Oral)   Resp 16   Ht 5\' 1"  (1.549 m)   Wt 254 lb 9.6 oz (115.5 kg)   SpO2 97%   BMI 48.11 kg/m  Wt Readings from Last 3 Encounters:  07/28/16 254 lb 9.6 oz (115.5 kg)  07/16/16 252 lb 6.4 oz (114.5 kg)  06/04/16 246 lb (111.6 kg)   BP Readings from Last 3 Encounters:  07/28/16 (!) 142/78  07/16/16 138/80  06/02/16 (!) 158/78     Immunization History  Administered Date(s) Administered  . Td 06/17/1988, 08/26/2009    Health Maintenance  Topic Date Due  . COLONOSCOPY  03/29/2010  . MAMMOGRAM  12/19/2015  . INFLUENZA VACCINE  11/04/2016  . PAP SMEAR  04/01/2018  . TETANUS/TDAP  08/27/2019  . Hepatitis C Screening  Completed  . HIV Screening  Completed    Lab Results  Component Value Date   WBC 9.4 06/01/2016   HGB 14.6 06/01/2016   HCT 43.2  06/01/2016   PLT 283 06/01/2016   GLUCOSE 99 07/28/2016   CHOL 164 07/28/2016   TRIG 113.0 07/28/2016   HDL 42.20 07/28/2016   LDLDIRECT 153.1 03/15/2012   LDLCALC 99 07/28/2016   ALT 17 07/28/2016   AST 17 07/28/2016   NA 138 07/28/2016   K 4.3 07/28/2016   CL  103 07/28/2016   CREATININE 0.79 07/28/2016   BUN 9 07/28/2016   CO2 30 07/28/2016   TSH 1.08 04/02/2015   HGBA1C 6.4 05/24/2009    Lab Results  Component Value Date   TSH 1.08 04/02/2015   Lab Results  Component Value Date   WBC 9.4 06/01/2016   HGB 14.6 06/01/2016   HCT 43.2 06/01/2016   MCV 91.1 06/01/2016   PLT 283 06/01/2016   Lab Results  Component Value Date   NA 138 07/28/2016   K 4.3 07/28/2016   CO2 30 07/28/2016   GLUCOSE 99 07/28/2016   BUN 9 07/28/2016   CREATININE 0.79 07/28/2016   BILITOT 0.3 07/28/2016   ALKPHOS 66 07/28/2016   AST 17 07/28/2016   ALT 17 07/28/2016   PROT 7.8 07/28/2016   ALBUMIN 4.1 07/28/2016   CALCIUM 9.7 07/28/2016   ANIONGAP 8 06/02/2016   GFR 96.70 07/28/2016   Lab Results  Component Value Date   CHOL 164 07/28/2016   Lab Results  Component Value Date   HDL 42.20 07/28/2016   Lab Results  Component Value Date   LDLCALC 99 07/28/2016   Lab Results  Component Value Date   TRIG 113.0 07/28/2016   Lab Results  Component Value Date   CHOLHDL 4 07/28/2016   Lab Results  Component Value Date   HGBA1C 6.4 05/24/2009         Assessment & Plan:   Problem List Items Addressed This Visit      Unprioritized   Essential hypertension - Primary    Well controlled, no changes to meds. Encouraged heart healthy diet such as the DASH diet and exercise as tolerated.       Relevant Orders   Comprehensive metabolic panel (Completed)   HLD (hyperlipidemia)    Encouraged heart healthy diet, increase exercise, avoid trans fats, consider a krill oil cap daily      Relevant Orders   Lipid panel (Completed)      I am having Ms. Maldonado maintain her  esomeprazole, aspirin, diclofenac sodium, Albuterol Sulfate, ibuprofen, CENTRUM SILVER 31+VQMGQ, GARLIC PO, vitamin E, cetirizine, guaiFENesin, simvastatin, benzonatate, fluticasone, traMADol, and diazepam.  Meds ordered this encounter  Medications  . traMADol (ULTRAM) 50 MG tablet    Sig: Take 1 tablet (50 mg total) by mouth every 6 (six) hours as needed.    Dispense:  30 tablet    Refill:  0  . diazepam (VALIUM) 5 MG tablet    Sig: Take 1 tablet (5 mg total) by mouth every 6 (six) hours as needed.    Dispense:  30 tablet    Refill:  0    CMA served as scribe during this visit. History, Physical and Plan performed by medical provider. Documentation and orders reviewed and attested to.  Ann Held, DO   Patient ID: Ebony Curtis, female   DOB: 11-May-1960, 56 y.o.   MRN: [id number]

## 2016-07-28 NOTE — Progress Notes (Signed)
Pre visit review using our clinic review tool, if applicable. No additional management support is needed unless otherwise documented below in the visit note. 

## 2016-08-04 ENCOUNTER — Other Ambulatory Visit (INDEPENDENT_AMBULATORY_CARE_PROVIDER_SITE_OTHER): Payer: Federal, State, Local not specified - PPO

## 2016-08-04 DIAGNOSIS — I10 Essential (primary) hypertension: Secondary | ICD-10-CM

## 2016-08-04 LAB — COMPREHENSIVE METABOLIC PANEL
ALT: 15 U/L (ref 0–35)
AST: 17 U/L (ref 0–37)
Albumin: 3.9 g/dL (ref 3.5–5.2)
Alkaline Phosphatase: 69 U/L (ref 39–117)
BILIRUBIN TOTAL: 0.2 mg/dL (ref 0.2–1.2)
BUN: 12 mg/dL (ref 6–23)
CHLORIDE: 106 meq/L (ref 96–112)
CO2: 29 mEq/L (ref 19–32)
CREATININE: 0.84 mg/dL (ref 0.40–1.20)
Calcium: 9.5 mg/dL (ref 8.4–10.5)
GFR: 90.08 mL/min (ref >60.00)
Glucose, Bld: 145 mg/dL — ABNORMAL HIGH (ref 70–99)
Potassium: 4.6 mEq/L (ref 3.5–5.1)
SODIUM: 140 meq/L (ref 135–145)
Total Protein: 7.4 g/dL (ref 6.0–8.3)

## 2016-08-04 LAB — LIPID PANEL
CHOL/HDL RATIO: 4
Cholesterol: 149 mg/dL (ref 0–200)
HDL: 39.8 mg/dL (ref >39.00)
LDL CALC: 82 mg/dL (ref 0–99)
NonHDL: 109.07
Triglycerides: 134 mg/dL (ref 0.0–149.0)
VLDL: 26.8 mg/dL (ref 0.0–40.0)

## 2016-08-10 ENCOUNTER — Other Ambulatory Visit: Payer: Self-pay | Admitting: Family Medicine

## 2016-08-10 DIAGNOSIS — R7309 Other abnormal glucose: Secondary | ICD-10-CM

## 2016-08-12 ENCOUNTER — Other Ambulatory Visit (INDEPENDENT_AMBULATORY_CARE_PROVIDER_SITE_OTHER): Payer: Federal, State, Local not specified - PPO

## 2016-08-12 DIAGNOSIS — R7309 Other abnormal glucose: Secondary | ICD-10-CM

## 2016-08-12 LAB — BASIC METABOLIC PANEL
BUN: 12 mg/dL (ref 6–23)
CALCIUM: 9.4 mg/dL (ref 8.4–10.5)
CO2: 26 mEq/L (ref 19–32)
CREATININE: 0.82 mg/dL (ref 0.40–1.20)
Chloride: 106 mEq/L (ref 96–112)
GFR: 92.62 mL/min (ref >60.00)
Glucose, Bld: 129 mg/dL — ABNORMAL HIGH (ref 70–99)
Potassium: 4.2 mEq/L (ref 3.5–5.1)
Sodium: 138 mEq/L (ref 135–145)

## 2016-08-12 LAB — HEMOGLOBIN A1C: HEMOGLOBIN A1C: 7.3 % — AB (ref 4.6–6.5)

## 2016-08-13 ENCOUNTER — Telehealth: Payer: Self-pay | Admitting: Family Medicine

## 2016-08-13 ENCOUNTER — Other Ambulatory Visit: Payer: Self-pay | Admitting: Family Medicine

## 2016-08-13 DIAGNOSIS — E785 Hyperlipidemia, unspecified: Secondary | ICD-10-CM

## 2016-08-13 DIAGNOSIS — E119 Type 2 diabetes mellitus without complications: Secondary | ICD-10-CM

## 2016-08-13 MED ORDER — SITAGLIPTIN PHOSPHATE 100 MG PO TABS: 100.0000 mg | ORAL_TABLET | Freq: Every day | ORAL | 3 refills | Status: DC

## 2016-08-13 MED ORDER — METFORMIN HCL ER 500 MG PO TB24: 500.0000 mg | ORAL_TABLET | Freq: Every day | ORAL | 3 refills | Status: DC

## 2016-08-13 NOTE — Telephone Encounter (Signed)
Advise

## 2016-08-13 NOTE — Telephone Encounter (Signed)
Caller name: Relationship to patient: Self Can be reached: [phone]  Pharmacy: [facility], [place]  Reason for call: Patient states that the Metformin pills are too large for her to swallow and she needs something else.

## 2016-08-13 NOTE — Addendum Note (Signed)
Addended by: Sharon Seller B on: 08/13/2016 04:57 PM   Modules accepted: Orders

## 2016-08-13 NOTE — Telephone Encounter (Signed)
januvia 100 mg #30  1 po qd,   2 refills  

## 2016-08-13 NOTE — Telephone Encounter (Signed)
Updated list and sent in [place] Patient notified.

## 2016-08-14 ENCOUNTER — Ambulatory Visit (INDEPENDENT_AMBULATORY_CARE_PROVIDER_SITE_OTHER): Payer: Federal, State, Local not specified - PPO

## 2016-08-14 ENCOUNTER — Other Ambulatory Visit: Payer: Self-pay

## 2016-08-14 DIAGNOSIS — E119 Type 2 diabetes mellitus without complications: Secondary | ICD-10-CM

## 2016-08-14 MED ORDER — GLUCOSE BLOOD VI STRP: ORAL_STRIP | 12 refills | Status: AC

## 2016-08-14 MED ORDER — ONETOUCH ULTRASOFT LANCETS MISC: 12 refills | Status: DC

## 2016-08-14 NOTE — Progress Notes (Addendum)
Pre visit review using our clinic tool,if applicable. No additional management support is needed unless otherwise documented below in the visit note.   Patient in today per order from Dr. Kendrick Fries from manufacturer [employer] dated  08/12/16 for Glucometer training.   Patient given demonstration on how to use glucometer to check blood sugar level. Patient gave return demonstration accurately. Advised to record Blood sugar readings in log book provided and to bring with her to next OV with her. Patient also given Diabetic diet information.     Patient states Metformin tablet too large for her to take advised Dr. Carollee Herter ordered Celesta Gentile on yesterday. Given discount card from manufacturer to use at pharmacy.  Patient scheduled to return for follow up in 3 months and advised to call office with any problems. Patient agreed.  Agreed-- Ann Held, DO

## 2016-09-07 ENCOUNTER — Ambulatory Visit (INDEPENDENT_AMBULATORY_CARE_PROVIDER_SITE_OTHER): Payer: Federal, State, Local not specified - PPO | Admitting: Family Medicine

## 2016-09-07 ENCOUNTER — Encounter: Payer: Self-pay | Admitting: Family Medicine

## 2016-09-07 VITALS — BP 128/76 | HR 74 | Temp 98.3°F | Resp 16 | Ht 61.0 in | Wt 245.0 lb

## 2016-09-07 DIAGNOSIS — E2839 Other primary ovarian failure: Secondary | ICD-10-CM

## 2016-09-07 DIAGNOSIS — E785 Hyperlipidemia, unspecified: Secondary | ICD-10-CM

## 2016-09-07 DIAGNOSIS — Z0000 Encounter for general adult medical examination without abnormal findings: Secondary | ICD-10-CM | POA: Diagnosis not present

## 2016-09-07 DIAGNOSIS — E118 Type 2 diabetes mellitus with unspecified complications: Secondary | ICD-10-CM

## 2016-09-07 DIAGNOSIS — Z1239 Encounter for other screening for malignant neoplasm of breast: Secondary | ICD-10-CM

## 2016-09-07 DIAGNOSIS — Z1231 Encounter for screening mammogram for malignant neoplasm of breast: Secondary | ICD-10-CM

## 2016-09-07 MED ORDER — ONETOUCH DELICA LANCETS FINE MISC: 2 refills | Status: AC

## 2016-09-07 NOTE — Patient Instructions (Addendum)
Preventive Care 40-64 Years, Female Preventive care refers to lifestyle choices and visits with your health care provider that can promote health and wellness. What does preventive care include?  A yearly physical exam. This is also called an annual well check.  Dental exams once or twice a year.  Routine eye exams. Ask your health care provider how often you should have your eyes checked.  Personal lifestyle choices, including: ? Daily care of your teeth and gums. ? Regular physical activity. ? Eating a healthy diet. ? Avoiding tobacco and drug use. ? Limiting alcohol use. ? Practicing safe sex. ? Taking low-dose aspirin daily starting at age 50. ? Taking vitamin and mineral supplements as recommended by your health care provider. What happens during an annual well check? The services and screenings done by your health care provider during your annual well check will depend on your age, overall health, lifestyle risk factors, and family history of disease. Counseling Your health care provider may ask you questions about your:  Alcohol use.  Tobacco use.  Drug use.  Emotional well-being.  Home and relationship well-being.  Sexual activity.  Eating habits.  Work and work environment.  Method of birth control.  Menstrual cycle.  Pregnancy history.  Screening You may have the following tests or measurements:  Height, weight, and BMI.  Blood pressure.  Lipid and cholesterol levels. These may be checked every 5 years, or more frequently if you are over 50 years old.  Skin check.  Lung cancer screening. You may have this screening every year starting at age 55 if you have a 30-pack-year history of smoking and currently smoke or have quit within the past 15 years.  Fecal occult blood test (FOBT) of the stool. You may have this test every year starting at age 50.  Flexible sigmoidoscopy or colonoscopy. You may have a sigmoidoscopy every 5 years or a colonoscopy  every 10 years starting at age 50.  Hepatitis C blood test.  Hepatitis B blood test.  Sexually transmitted disease (STD) testing.  Diabetes screening. This is done by checking your blood sugar (glucose) after you have not eaten for a while (fasting). You may have this done every 1-3 years.  Mammogram. This may be done every 1-2 years. Talk to your health care provider about when you should start having regular mammograms. This may depend on whether you have a family history of breast cancer.  BRCA-related cancer screening. This may be done if you have a family history of breast, ovarian, tubal, or peritoneal cancers.  Pelvic exam and Pap test. This may be done every 3 years starting at age 21. Starting at age 30, this may be done every 5 years if you have a Pap test in combination with an HPV test.  Bone density scan. This is done to screen for osteoporosis. You may have this scan if you are at high risk for osteoporosis.  Discuss your test results, treatment options, and if necessary, the need for more tests with your health care provider. Vaccines Your health care provider may recommend certain vaccines, such as:  Influenza vaccine. This is recommended every year.  Tetanus, diphtheria, and acellular pertussis (Tdap, Td) vaccine. You may need a Td booster every 10 years.  Varicella vaccine. You may need this if you have not been vaccinated.  Zoster vaccine. You may need this after age 60.  Measles, mumps, and rubella (MMR) vaccine. You may need at least one dose of MMR if you were born in   1958 or later. You may also need a second dose.  Pneumococcal 13-valent conjugate (PCV13) vaccine. You may need this if you have certain conditions and were not previously vaccinated.  Pneumococcal polysaccharide (PPSV23) vaccine. You may need one or two doses if you smoke cigarettes or if you have certain conditions.  Meningococcal vaccine. You may need this if you have certain  conditions.  Hepatitis A vaccine. You may need this if you have certain conditions or if you travel or work in places where you may be exposed to hepatitis A.  Hepatitis B vaccine. You may need this if you have certain conditions or if you travel or work in places where you may be exposed to hepatitis B.  Haemophilus influenzae type b (Hib) vaccine. You may need this if you have certain conditions.  Talk to your health care provider about which screenings and vaccines you need and how often you need them. This information is not intended to replace advice given to you by your health care provider. Make sure you discuss any questions you have with your health care provider. Document Released: 04/19/2015 Document Revised: 12/11/2015 Document Reviewed: 01/22/2015 Elsevier Interactive Patient Education  2017 Elsevier Inc.  

## 2016-09-07 NOTE — Progress Notes (Signed)
Subjective:     Ebony Curtis is a 56 y.o. female and is here for a comprehensive physical exam. The patient reports no problems.  Social History   Social History  . Marital status: Married    Spouse name: N/A  . Number of children: N/A  . Years of education: N/A   Occupational History  . disable [employer]    disability/ retired   Social History Main Topics  . Smoking status: Current Every Day Smoker    Packs/day: 1.00    Years: 35.00    Types: Cigarettes  . Smokeless tobacco: Never Used  . Alcohol use Yes     Comment: 06/01/2016 "last drink was 10/2015"  . Drug use: No  . Sexual activity: Yes    Partners: Male   Other Topics Concern  . Not on file   Social History Narrative   Exercise--goes to the [employer] [facility]  Topic Date Due  . URINE MICROALBUMIN  03/29/1970  . COLONOSCOPY  03/29/2010  . MAMMOGRAM  12/19/2014  . PNEUMOCOCCAL POLYSACCHARIDE VACCINE (1) 09/06/2021 (Originally 03/29/1962)  . INFLUENZA VACCINE  11/04/2016  . HEMOGLOBIN A1C  02/12/2017  . OPHTHALMOLOGY EXAM  09/01/2017  . FOOT EXAM  09/07/2017  . PAP SMEAR  04/01/2018  . TETANUS/TDAP  08/27/2019  . Hepatitis C Screening  Completed  . HIV Screening  Completed    The following portions of the patient's history were reviewed and updated as appropriate:  She  has a past medical history of Chronic lower back pain; DVT (deep venous thrombosis) ([place]) ("years ago"); Family history of adverse reaction to anesthesia; GERD (gastroesophageal reflux disease); Heart murmur; Hyperlipidemia; and Migraines. She  does not have any pertinent problems on file. She  has a past surgical history that includes Tonsillectomy (1970s). Her family history includes Arthritis in her mother; Diabetes (age of onset: 70) in her sister; Heart disease in her mother and sister; Heart disease (age of onset: 71) in her father. She  reports that she has been smoking Cigarettes.  She has a 35.00 pack-year smoking  history. She has never used smokeless tobacco. She reports that she drinks alcohol. She reports that she does not use drugs. She has a current medication list which includes the following prescription(s): albuterol sulfate, aspirin, cetirizine, diazepam, diclofenac sodium, esomeprazole, fluticasone, garlic, glucose blood, ibuprofen, centrum silver 50+women, simvastatin, sitagliptin, tramadol, vitamin e, and onetouch delica lancets fine. Current Outpatient Prescriptions on File Prior to Visit  Medication Sig Dispense Refill  . Albuterol Sulfate (PROAIR RESPICLICK) 371 (90 BASE) MCG/ACT AEPB Inhale 1 Inhaler into the lungs every 6 (six) hours as needed. 1 each 1  . aspirin 81 MG tablet Take 81 mg by mouth daily.    . cetirizine (ZYRTEC) 10 MG tablet Take 10 mg by mouth daily as needed for allergies.    . diazepam (VALIUM) 5 MG tablet Take 1 tablet (5 mg total) by mouth every 6 (six) hours as needed. 30 tablet 0  . diclofenac sodium (VOLTAREN) 1 % GEL Apply 2 g topically 4 (four) times daily. Prn back pain (Patient taking differently: Apply 2 g topically 4 (four) times daily as needed (for back pain). ) 100 g 5  . esomeprazole (NEXIUM) 40 MG capsule Take 1 capsule (40 mg total) by mouth daily before breakfast. 90 capsule 3  . fluticasone (FLONASE) 50 MCG/ACT nasal spray Place 2 sprays into both nostrils daily. 16 g 1  . GARLIC PO Take 1 capsule by mouth  daily.    . glucose blood test strip Use as instructed 100 each 12  . ibuprofen (ADVIL,MOTRIN) 200 MG tablet Take 400 mg by mouth every 6 (six) hours as needed (for back pain).    . Multiple Vitamins-Minerals (CENTRUM SILVER 50+WOMEN) TABS Take 1 tablet by mouth daily.    . simvastatin (ZOCOR) 40 MG tablet Take 1 tablet (40 mg total) by mouth daily. 90 tablet 1  . sitaGLIPtin (JANUVIA) 100 MG tablet Take 1 tablet (100 mg total) by mouth daily. 30 tablet 3  . traMADol (ULTRAM) 50 MG tablet Take 1 tablet (50 mg total) by mouth every 6 (six) hours as  needed. 30 tablet 0  . vitamin E (VITAMIN E) 400 UNIT capsule Take 400 Units by mouth daily.     No current facility-administered medications on file prior to visit.    She has No Known Allergies..  Review of Systems Review of Systems  Constitutional: Negative for activity change, appetite change and fatigue.  HENT: Negative for hearing loss, congestion, tinnitus and ear discharge.  dentist q38m Eyes: Negative for visual disturbance (see optho q1y -- vision corrected to 20/20 with glasses).  Respiratory: Negative for cough, chest tightness and shortness of breath.   Cardiovascular: Negative for chest pain, palpitations and leg swelling.  Gastrointestinal: Negative for abdominal pain, diarrhea, constipation and abdominal distention.  Genitourinary: Negative for urgency, frequency, decreased urine volume and difficulty urinating.  Musculoskeletal: Negative for back pain, arthralgias and gait problem.  Skin: Negative for color change, pallor and rash.  Neurological: Negative for dizziness, light-headedness, numbness and headaches.  Hematological: Negative for adenopathy. Does not bruise/bleed easily.  Psychiatric/Behavioral: Negative for suicidal ideas, confusion, sleep disturbance, self-injury, dysphoric mood, decreased concentration and agitation.       Objective:    BP 128/76 (BP Location: Left Arm, Cuff Size: Normal)   Pulse 74   Temp 98.3 F (36.8 C) (Oral)   Resp 16   Ht 5\' 1"  (1.549 m)   Wt 245 lb (111.1 kg)   SpO2 96%   BMI 46.29 kg/m  General appearance: alert, cooperative, appears stated age and no distress Head: Normocephalic, without obvious abnormality, atraumatic Eyes: conjunctivae/corneas clear. PERRL, EOM's intact. Fundi benign. Ears: normal TM's and external ear canals both ears Nose: Nares normal. Septum midline. Mucosa normal. No drainage or sinus tenderness. Throat: lips, mucosa, and tongue normal; teeth and gums normal Neck: no adenopathy, no carotid  bruit, no JVD, supple, symmetrical, trachea midline and thyroid not enlarged, symmetric, no tenderness/mass/nodules Back: symmetric, no curvature. ROM normal. No CVA tenderness. Lungs: clear to auscultation bilaterally Breasts: normal appearance, no masses or tenderness Heart: regular rate and rhythm, S1, S2 normal, no murmur, click, rub or gallop Abdomen: soft, non-tender; bowel sounds normal; no masses,  no organomegaly Pelvic: deferred Extremities: extremities normal, atraumatic, no cyanosis or edema Pulses: 2+ and symmetric Skin: Skin color, texture, turgor normal. No rashes or lesions Lymph nodes: Cervical, supraclavicular, and axillary nodes normal. Neurologic: Alert and oriented X 3, normal strength and tone. Normal symmetric reflexes. Normal coordination and gait   .monofilament 00  .                            Assessment:    Healthy female exam.    Plan:    ghm utd Check labs Pt refusing all vaccines See After Visit Summary for Counseling Recommendations    1. Preventative health care See above  2. Type  2 diabetes mellitus with complication, without long-term current use of insulin ([place])   - ONETOUCH DELICA LANCETS FINE MISC; Use as directed once a day.  DX: E11.9  Dispense: 100 each; Refill: 2 - Microalbumin / creatinine urine ratio  3. Hyperlipidemia LDL goal <70 Tolerating statin, encouraged heart healthy diet, avoid trans fats, minimize simple carbs and saturated fats. Increase exercise as tolerated  4. Estrogen deficiency   - DG Bone Density; Future  5. Breast cancer screening   - MM SCREENING BREAST TOMO BILATERAL; Future

## 2016-09-08 ENCOUNTER — Telehealth: Payer: Self-pay | Admitting: Family Medicine

## 2016-09-08 ENCOUNTER — Other Ambulatory Visit: Payer: Self-pay | Admitting: Family Medicine

## 2016-09-08 DIAGNOSIS — E119 Type 2 diabetes mellitus without complications: Secondary | ICD-10-CM

## 2016-09-08 LAB — MICROALBUMIN / CREATININE URINE RATIO
CREATININE, URINE: 168 mg/dL (ref 20–320)
MICROALB UR: 0.7 mg/dL
MICROALB/CREAT RATIO: 4 ug/mg{creat} (ref <30)

## 2016-09-08 NOTE — Telephone Encounter (Signed)
Ok to refer --- re diabetes

## 2016-09-08 NOTE — Telephone Encounter (Addendum)
Relation to pt: self Call back number:[phone]   Reason for call:  Patient requesting referral to podiatrist due to toe nails needing to be clipped,due  Patient states she's diabetic

## 2016-09-08 NOTE — Telephone Encounter (Signed)
Referral done

## 2016-09-10 ENCOUNTER — Ambulatory Visit (HOSPITAL_BASED_OUTPATIENT_CLINIC_OR_DEPARTMENT_OTHER)
Admission: RE | Admit: 2016-09-10 | Discharge: 2016-09-10 | Disposition: A | Discharge status: Home / Self Care | Payer: Federal, State, Local not specified - PPO | Source: Ambulatory Visit | Attending: Family Medicine | Admitting: Family Medicine

## 2016-09-10 DIAGNOSIS — Z1239 Encounter for other screening for malignant neoplasm of breast: Secondary | ICD-10-CM

## 2016-09-10 DIAGNOSIS — Z1231 Encounter for screening mammogram for malignant neoplasm of breast: Secondary | ICD-10-CM | POA: Insufficient documentation

## 2016-09-10 DIAGNOSIS — E2839 Other primary ovarian failure: Secondary | ICD-10-CM | POA: Insufficient documentation

## 2016-09-29 ENCOUNTER — Ambulatory Visit: Payer: Self-pay | Admitting: Podiatry

## 2016-11-13 ENCOUNTER — Other Ambulatory Visit: Payer: Federal, State, Local not specified - PPO

## 2016-11-16 ENCOUNTER — Other Ambulatory Visit (INDEPENDENT_AMBULATORY_CARE_PROVIDER_SITE_OTHER): Payer: Federal, State, Local not specified - PPO

## 2016-11-16 DIAGNOSIS — E785 Hyperlipidemia, unspecified: Secondary | ICD-10-CM

## 2016-11-16 DIAGNOSIS — E119 Type 2 diabetes mellitus without complications: Secondary | ICD-10-CM

## 2016-11-16 LAB — COMPREHENSIVE METABOLIC PANEL
ALT: 14 U/L (ref 0–35)
AST: 14 U/L (ref 0–37)
Albumin: 4 g/dL (ref 3.5–5.2)
Alkaline Phosphatase: 53 U/L (ref 39–117)
BILIRUBIN TOTAL: 0.3 mg/dL (ref 0.2–1.2)
BUN: 15 mg/dL (ref 6–23)
CO2: 25 mEq/L (ref 19–32)
Calcium: 9.2 mg/dL (ref 8.4–10.5)
Chloride: 105 mEq/L (ref 96–112)
Creatinine, Ser: 0.78 mg/dL (ref 0.40–1.20)
GFR: 98.03 mL/min (ref >60.00)
GLUCOSE: 104 mg/dL — AB (ref 70–99)
Potassium: 4.1 mEq/L (ref 3.5–5.1)
SODIUM: 138 meq/L (ref 135–145)
TOTAL PROTEIN: 6.9 g/dL (ref 6.0–8.3)

## 2016-11-16 LAB — LIPID PANEL
CHOL/HDL RATIO: 4
Cholesterol: 131 mg/dL (ref 0–200)
HDL: 36.8 mg/dL — AB (ref >39.00)
LDL CALC: 75 mg/dL (ref 0–99)
NONHDL: 94.02
TRIGLYCERIDES: 95 mg/dL (ref 0.0–149.0)
VLDL: 19 mg/dL (ref 0.0–40.0)

## 2016-11-16 LAB — HEMOGLOBIN A1C: Hgb A1c MFr Bld: 6.6 % — ABNORMAL HIGH (ref 4.6–6.5)

## 2016-12-03 ENCOUNTER — Other Ambulatory Visit: Payer: Self-pay | Admitting: Family Medicine

## 2016-12-03 NOTE — Telephone Encounter (Signed)
Faxed Januvia 30d+2/OV due in Dec/thx dmf

## 2017-03-10 ENCOUNTER — Other Ambulatory Visit: Payer: Self-pay | Admitting: Family Medicine

## 2017-05-11 ENCOUNTER — Other Ambulatory Visit: Payer: Self-pay | Admitting: Family Medicine

## 2017-06-11 ENCOUNTER — Other Ambulatory Visit: Payer: Self-pay | Admitting: Family Medicine

## 2017-07-12 ENCOUNTER — Other Ambulatory Visit: Payer: Self-pay | Admitting: Family Medicine

## 2017-08-12 ENCOUNTER — Other Ambulatory Visit: Payer: Self-pay | Admitting: Family Medicine

## 2017-08-26 ENCOUNTER — Ambulatory Visit: Payer: Federal, State, Local not specified - PPO | Admitting: Family Medicine

## 2017-08-26 ENCOUNTER — Other Ambulatory Visit: Payer: Self-pay | Admitting: Family Medicine

## 2017-08-26 ENCOUNTER — Encounter: Payer: Self-pay | Admitting: Family Medicine

## 2017-08-26 VITALS — BP 138/80 | HR 74 | Temp 98.4°F | Resp 16 | Ht 61.0 in | Wt 233.6 lb

## 2017-08-26 DIAGNOSIS — E785 Hyperlipidemia, unspecified: Secondary | ICD-10-CM | POA: Diagnosis not present

## 2017-08-26 DIAGNOSIS — J302 Other seasonal allergic rhinitis: Secondary | ICD-10-CM | POA: Diagnosis not present

## 2017-08-26 DIAGNOSIS — E1165 Type 2 diabetes mellitus with hyperglycemia: Secondary | ICD-10-CM | POA: Diagnosis not present

## 2017-08-26 DIAGNOSIS — I10 Essential (primary) hypertension: Secondary | ICD-10-CM

## 2017-08-26 LAB — COMPREHENSIVE METABOLIC PANEL
ALT: 14 U/L (ref 0–35)
AST: 11 U/L (ref 0–37)
Albumin: 4.1 g/dL (ref 3.5–5.2)
Alkaline Phosphatase: 53 U/L (ref 39–117)
BUN: 14 mg/dL (ref 6–23)
CHLORIDE: 106 meq/L (ref 96–112)
CO2: 29 meq/L (ref 19–32)
Calcium: 9.9 mg/dL (ref 8.4–10.5)
Creatinine, Ser: 0.84 mg/dL (ref 0.40–1.20)
GFR: 89.74 mL/min (ref >60.00)
Glucose, Bld: 101 mg/dL — ABNORMAL HIGH (ref 70–99)
POTASSIUM: 4.7 meq/L (ref 3.5–5.1)
SODIUM: 140 meq/L (ref 135–145)
Total Bilirubin: 0.3 mg/dL (ref 0.2–1.2)
Total Protein: 7.2 g/dL (ref 6.0–8.3)

## 2017-08-26 LAB — MICROALBUMIN / CREATININE URINE RATIO
CREATININE, U: 111.6 mg/dL
Microalb Creat Ratio: 0.8 mg/g (ref 0.0–30.0)
Microalb, Ur: 0.9 mg/dL (ref 0.0–1.9)

## 2017-08-26 LAB — HEMOGLOBIN A1C: Hgb A1c MFr Bld: 6.5 % (ref 4.6–6.5)

## 2017-08-26 LAB — LIPID PANEL
CHOL/HDL RATIO: 4
Cholesterol: 152 mg/dL (ref 0–200)
HDL: 40.3 mg/dL (ref >39.00)
LDL CALC: 96 mg/dL (ref 0–99)
NONHDL: 111.82
Triglycerides: 80 mg/dL (ref 0.0–149.0)
VLDL: 16 mg/dL (ref 0.0–40.0)

## 2017-08-26 MED ORDER — SITAGLIPTIN PHOSPHATE 100 MG PO TABS: 100.0000 mg | ORAL_TABLET | Freq: Every day | ORAL | 1 refills | Status: DC

## 2017-08-26 MED ORDER — CETIRIZINE HCL 10 MG PO TABS: 10.0000 mg | ORAL_TABLET | Freq: Every day | ORAL | 11 refills | Status: DC | PRN

## 2017-08-26 NOTE — Progress Notes (Signed)
Patient ID: Ebony Curtis, female   DOB: 1960-10-19, 57 y.o.   MRN: [id number]       Subjective:  I acted as a Education administrator for Dr. Carollee Herter.  Guerry Bruin, [place]   Patient ID: Ebony Curtis, female    DOB: 08-Aug-1960, 57 y.o.   MRN: [id number]  Chief Complaint  Patient presents with  . Hypertension  . Hyperlipidemia  . Diabetes    HPI  Patient is in today for follow up blood pressure, diabetes, and cholesterol.  HPI HYPERTENSION   Blood pressure range-not checking   Chest pain- no      Dyspnea- no Lightheadedness- no   Edema- no  Other side effects - no   Medication compliance: good Low salt diet- yes    DIABETES    Blood Sugar ranges-good per pt  Polyuria- no New Visual problems- no  Hypoglycemic symptoms- no  Other side effects-no Medication compliance - good Last eye exam- due Foot exam- today   HYPERLIPIDEMIA  Medication compliance- good RUQ pain- no  Muscle aches- no Other side effects-no    Patient Care Team: Ann Held, DO as PCP - General   Past Medical History:  Diagnosis Date  . Chronic lower back pain   . DVT (deep venous thrombosis) ([place]) "years ago"   RUE  . Family history of adverse reaction to anesthesia    "sister's heart went out doing routine OR; she never woke up" (06/01/2016)  . GERD (gastroesophageal reflux disease)   . Heart murmur    "when I was young"  . Hyperlipidemia   . Migraines    "none in years" (06/01/2016)    Past Surgical History:  Procedure Laterality Date  . TONSILLECTOMY  1970s    Family History  Problem Relation Age of Onset  . Diabetes Sister 23  . Heart disease Sister   . Heart disease Father 60  . Arthritis Mother   . Heart disease Mother        CABG, PACER  . Coronary artery disease Unknown     Social History   Socioeconomic History  . Marital status: Married    Spouse name: Not on file  . Number of children: Not on file  . Years of education: Not on file  . Highest education level:  Not on file  Occupational History  . Occupation: disable    Fish farm manager: [employer]    Comment: disability/ retired  Scientific laboratory technician  . Financial resource strain: Not on file  . Food insecurity:    Worry: Not on file    Inability: Not on file  . Transportation needs:    Medical: Not on file    Non-medical: Not on file  Tobacco Use  . Smoking status: Current Every Day Smoker    Packs/day: 1.00    Years: 35.00    Pack years: 35.00    Types: Cigarettes  . Smokeless tobacco: Never Used  Substance and Sexual Activity  . Alcohol use: Yes    Comment: 06/01/2016 "last drink was 10/2015"  . Drug use: No  . Sexual activity: Yes    Partners: Male  Lifestyle  . Physical activity:    Days per week: Not on file    Minutes per session: Not on file  . Stress: Not on file  Relationships  . Social connections:    Talks on phone: Not on file    Gets together: Not on file    Attends religious service: Not on file  Active member of club or organization: Not on file    Attends meetings of clubs or organizations: Not on file    Relationship status: Not on file  . Intimate partner violence:    Fear of current or ex partner: Not on file    Emotionally abused: Not on file    Physically abused: Not on file    Forced sexual activity: Not on file  Other Topics Concern  . Not on file  Social History Narrative   Exercise--goes [facility] the [facility]    Outpatient Medications Prior to Visit  Medication Sig Dispense Refill  . Albuterol Sulfate (PROAIR RESPICLICK) 784 (90 BASE) MCG/ACT AEPB Inhale 1 Inhaler into the lungs every 6 (six) hours as needed. 1 each 1  . aspirin 81 MG tablet Take 81 mg by mouth daily.    . diazepam (VALIUM) 5 MG tablet Take 1 tablet (5 mg total) by mouth every 6 (six) hours as needed. 30 tablet 0  . diclofenac sodium (VOLTAREN) 1 % GEL Apply 2 g topically 4 (four) times daily. Prn back pain (Patient taking differently: Apply 2 g topically 4 (four) times daily as needed (for back  pain). ) 100 g 5  . esomeprazole (NEXIUM) 40 MG capsule Take 1 capsule (40 mg total) by mouth daily before breakfast. 90 capsule 3  . fluticasone (FLONASE) 50 MCG/ACT nasal spray Place 2 sprays into both nostrils daily. 16 g 1  . GARLIC PO Take 1 capsule by mouth daily.    Marland Kitchen glucose blood test strip Use as instructed 100 each 12  . ibuprofen (ADVIL,MOTRIN) 200 MG tablet Take 400 mg by mouth every 6 (six) hours as needed (for back pain).    . Multiple Vitamins-Minerals (CENTRUM SILVER 50+WOMEN) TABS Take 1 tablet by mouth daily.    Glory Rosebush DELICA LANCETS FINE MISC Use as directed once a day.  DX: E11.9 100 each 2  . simvastatin (ZOCOR) 40 MG tablet TAKE 1 TABLET BY MOUTH DAILY 90 tablet 0  . traMADol (ULTRAM) 50 MG tablet Take 1 tablet (50 mg total) by mouth every 6 (six) hours as needed. 30 tablet 0  . vitamin E (VITAMIN E) 400 UNIT capsule Take 400 Units by mouth daily.    . cetirizine (ZYRTEC) 10 MG tablet Take 10 mg by mouth daily as needed for allergies.    Marland Kitchen sitaGLIPtin (JANUVIA) 100 MG tablet Take 1 tablet (100 mg total) by mouth daily. Needs ov 15 tablet 0   No facility-administered medications prior to visit.     No Known Allergies  Review of Systems  Constitutional: Negative for fever and malaise/fatigue.  HENT: Negative for congestion.   Eyes: Negative for blurred vision.  Respiratory: Negative for cough and shortness of breath.   Cardiovascular: Negative for chest pain, palpitations and leg swelling.  Gastrointestinal: Negative for vomiting.  Musculoskeletal: Negative for back pain.  Skin: Negative for rash.  Neurological: Negative for loss of consciousness and headaches.       Objective:    Physical Exam  Constitutional: She is oriented to person, place, and time. She appears well-developed and well-nourished.  HENT:  Head: Normocephalic and atraumatic.  Eyes: Conjunctivae and EOM are normal.  Neck: Normal range of motion. Neck supple. No JVD present. Carotid  bruit is not present. No thyromegaly present.  Cardiovascular: Normal rate, regular rhythm and normal heart sounds.  No murmur heard. Pulmonary/Chest: Effort normal and breath sounds normal. No respiratory distress. She has no wheezes. She has no  rales. She exhibits no tenderness.  Musculoskeletal: She exhibits no edema.  Neurological: She is alert and oriented to person, place, and time.  Psychiatric: She has a normal mood and affect.   Diabetic Foot Exam - Simple   Simple Foot Form Diabetic Foot exam was performed with the following findings:  Yes 08/26/2017  4:46 PM  Visual Inspection No deformities, no ulcerations, no other skin breakdown bilaterally:  Yes Sensation Testing Intact to touch and monofilament testing bilaterally:  Yes Pulse Check Posterior Tibialis and Dorsalis pulse intact bilaterally:  Yes Comments      BP 138/80 (BP Location: Right Arm, Cuff Size: Large)   Pulse 74   Temp 98.4 F (36.9 C) (Oral)   Resp 16   Ht 5\' 1"  (1.549 m)   Wt 233 lb 9.6 oz (106 kg)   SpO2 97%   BMI 44.14 kg/m  Wt Readings from Last 3 Encounters:  08/26/17 233 lb 9.6 oz (106 kg)  09/07/16 245 lb (111.1 kg)  07/28/16 254 lb 9.6 oz (115.5 kg)   BP Readings from Last 3 Encounters:  08/26/17 138/80  09/07/16 128/76  07/28/16 (!) 142/78     Immunization History  Administered Date(s) Administered  . Td 06/17/1988, 08/26/2009    Health Maintenance  Topic Date Due  . COLONOSCOPY  03/29/2010  . PNEUMOCOCCAL POLYSACCHARIDE VACCINE (1) 09/06/2021 (Originally 03/29/1962)  . OPHTHALMOLOGY EXAM  09/01/2017  . FOOT EXAM  09/07/2017  . MAMMOGRAM  09/10/2017  . INFLUENZA VACCINE  11/04/2017  . HEMOGLOBIN A1C  02/26/2018  . PAP SMEAR  04/01/2018  . URINE MICROALBUMIN  08/27/2018  . TETANUS/TDAP  08/27/2019  . Hepatitis C Screening  Completed  . HIV Screening  Completed    Lab Results  Component Value Date   WBC 9.4 06/01/2016   HGB 14.6 06/01/2016   HCT 43.2 06/01/2016   PLT  283 06/01/2016   GLUCOSE 101 (H) 08/26/2017   CHOL 152 08/26/2017   TRIG 80.0 08/26/2017   HDL 40.30 08/26/2017   LDLDIRECT 153.1 03/15/2012   LDLCALC 96 08/26/2017   ALT 14 08/26/2017   AST 11 08/26/2017   NA 140 08/26/2017   K 4.7 08/26/2017   CL 106 08/26/2017   CREATININE 0.84 08/26/2017   BUN 14 08/26/2017   CO2 29 08/26/2017   TSH 1.08 04/02/2015   HGBA1C 6.5 08/26/2017   MICROALBUR 0.9 08/26/2017    Lab Results  Component Value Date   TSH 1.08 04/02/2015   Lab Results  Component Value Date   WBC 9.4 06/01/2016   HGB 14.6 06/01/2016   HCT 43.2 06/01/2016   MCV 91.1 06/01/2016   PLT 283 06/01/2016   Lab Results  Component Value Date   NA 140 08/26/2017   K 4.7 08/26/2017   CO2 29 08/26/2017   GLUCOSE 101 (H) 08/26/2017   BUN 14 08/26/2017   CREATININE 0.84 08/26/2017   BILITOT 0.3 08/26/2017   ALKPHOS 53 08/26/2017   AST 11 08/26/2017   ALT 14 08/26/2017   PROT 7.2 08/26/2017   ALBUMIN 4.1 08/26/2017   CALCIUM 9.9 08/26/2017   ANIONGAP 8 06/02/2016   GFR 89.74 08/26/2017   Lab Results  Component Value Date   CHOL 152 08/26/2017   Lab Results  Component Value Date   HDL 40.30 08/26/2017   Lab Results  Component Value Date   LDLCALC 96 08/26/2017   Lab Results  Component Value Date   TRIG 80.0 08/26/2017   Lab Results  Component  Value Date   CHOLHDL 4 08/26/2017   Lab Results  Component Value Date   HGBA1C 6.5 08/26/2017         Assessment & Plan:   Problem List Items Addressed This Visit      Unprioritized   Controlled type 2 diabetes mellitus with hyperglycemia, without long-term current use of insulin ([place]) - Primary    hgba1c to be checked, minimize simple carbs. Increase exercise as tolerated. Continue current meds       Relevant Medications   sitaGLIPtin (JANUVIA) 100 MG tablet   Other Relevant Orders   Hemoglobin A1c (Completed)   Comprehensive metabolic panel (Completed)   Microalbumin / creatinine urine ratio  (Completed)   Essential hypertension    Well controlled, no changes to meds. Encouraged heart healthy diet such as the DASH diet and exercise as tolerated.       Hyperlipidemia LDL goal <70    Tolerating statin, encouraged heart healthy diet, avoid trans fats, minimize simple carbs and saturated fats. Increase exercise as tolerated      Relevant Orders   Comprehensive metabolic panel (Completed)   Lipid panel (Completed)   Seasonal allergies    Zyrtec prn       Relevant Medications   cetirizine (ZYRTEC) 10 MG tablet      I have changed Ebony Curtis's cetirizine. I am also having her maintain her esomeprazole, aspirin, diclofenac sodium, Albuterol Sulfate, ibuprofen, CENTRUM SILVER 16+XWRUE, GARLIC PO, vitamin E, fluticasone, traMADol, diazepam, glucose blood, ONETOUCH DELICA LANCETS FINE, simvastatin, and sitaGLIPtin.  Meds ordered this encounter  Medications  . sitaGLIPtin (JANUVIA) 100 MG tablet    Sig: Take 1 tablet (100 mg total) by mouth daily. Needs ov    Dispense:  90 tablet    Refill:  1  . cetirizine (ZYRTEC) 10 MG tablet    Sig: Take 1 tablet (10 mg total) by mouth daily as needed for allergies.    Dispense:  30 tablet    Refill:  11    CMA served as scribe during this visit. History, Physical and Plan performed by medical provider. Documentation and orders reviewed and attested to.  Ann Held, DO

## 2017-08-26 NOTE — Patient Instructions (Signed)

## 2017-08-27 DIAGNOSIS — J302 Other seasonal allergic rhinitis: Secondary | ICD-10-CM | POA: Insufficient documentation

## 2017-08-27 DIAGNOSIS — E1165 Type 2 diabetes mellitus with hyperglycemia: Secondary | ICD-10-CM | POA: Insufficient documentation

## 2017-08-27 NOTE — Assessment & Plan Note (Signed)
Well controlled, no changes to meds. Encouraged heart healthy diet such as the DASH diet and exercise as tolerated.  °

## 2017-08-27 NOTE — Assessment & Plan Note (Signed)
hgba1c to be checked, minimize simple carbs. Increase exercise as tolerated. Continue current meds  

## 2017-08-27 NOTE — Assessment & Plan Note (Signed)
Tolerating statin, encouraged heart healthy diet, avoid trans fats, minimize simple carbs and saturated fats. Increase exercise as tolerated 

## 2017-08-27 NOTE — Assessment & Plan Note (Signed)
Zyrtec prn  

## 2017-10-26 ENCOUNTER — Other Ambulatory Visit: Payer: Self-pay | Admitting: Family Medicine

## 2018-02-23 ENCOUNTER — Other Ambulatory Visit: Payer: Self-pay | Admitting: Family Medicine

## 2018-02-23 DIAGNOSIS — E1165 Type 2 diabetes mellitus with hyperglycemia: Secondary | ICD-10-CM

## 2018-03-07 ENCOUNTER — Ambulatory Visit (INDEPENDENT_AMBULATORY_CARE_PROVIDER_SITE_OTHER): Payer: Federal, State, Local not specified - PPO | Admitting: Family Medicine

## 2018-03-07 ENCOUNTER — Encounter: Payer: Self-pay | Admitting: Family Medicine

## 2018-03-07 VITALS — BP 139/68 | HR 79 | Temp 98.7°F | Resp 16 | Ht 61.0 in | Wt 236.4 lb

## 2018-03-07 DIAGNOSIS — D229 Melanocytic nevi, unspecified: Secondary | ICD-10-CM | POA: Diagnosis not present

## 2018-03-07 DIAGNOSIS — E785 Hyperlipidemia, unspecified: Secondary | ICD-10-CM | POA: Diagnosis not present

## 2018-03-07 DIAGNOSIS — Z0000 Encounter for general adult medical examination without abnormal findings: Secondary | ICD-10-CM

## 2018-03-07 DIAGNOSIS — E1165 Type 2 diabetes mellitus with hyperglycemia: Secondary | ICD-10-CM | POA: Diagnosis not present

## 2018-03-07 DIAGNOSIS — I10 Essential (primary) hypertension: Secondary | ICD-10-CM | POA: Diagnosis not present

## 2018-03-07 DIAGNOSIS — E1169 Type 2 diabetes mellitus with other specified complication: Secondary | ICD-10-CM

## 2018-03-07 LAB — COMPREHENSIVE METABOLIC PANEL
ALBUMIN: 4.3 g/dL (ref 3.5–5.2)
ALK PHOS: 53 U/L (ref 39–117)
ALT: 14 U/L (ref 0–35)
AST: 12 U/L (ref 0–37)
BUN: 15 mg/dL (ref 6–23)
CO2: 26 mEq/L (ref 19–32)
Calcium: 9.7 mg/dL (ref 8.4–10.5)
Chloride: 105 mEq/L (ref 96–112)
Creatinine, Ser: 0.88 mg/dL (ref 0.40–1.20)
GFR: 84.9 mL/min (ref >60.00)
Glucose, Bld: 92 mg/dL (ref 70–99)
POTASSIUM: 4.7 meq/L (ref 3.5–5.1)
SODIUM: 139 meq/L (ref 135–145)
Total Bilirubin: 0.4 mg/dL (ref 0.2–1.2)
Total Protein: 7.5 g/dL (ref 6.0–8.3)

## 2018-03-07 LAB — CBC WITH DIFFERENTIAL/PLATELET
Basophils Absolute: 0.1 10*3/uL (ref 0.0–0.1)
Basophils Relative: 0.6 % (ref 0.0–3.0)
EOS ABS: 0.1 10*3/uL (ref 0.0–0.7)
Eosinophils Relative: 1 % (ref 0.0–5.0)
HEMATOCRIT: 46.6 % — AB (ref 36.0–46.0)
HEMOGLOBIN: 15.4 g/dL — AB (ref 12.0–15.0)
LYMPHS PCT: 49.4 % — AB (ref 12.0–46.0)
Lymphs Abs: 4.1 10*3/uL — ABNORMAL HIGH (ref 0.7–4.0)
MCHC: 33.1 g/dL (ref 30.0–36.0)
MCV: 92.7 fl (ref 78.0–100.0)
MONOS PCT: 6.6 % (ref 3.0–12.0)
Monocytes Absolute: 0.5 10*3/uL (ref 0.1–1.0)
NEUTROS ABS: 3.5 10*3/uL (ref 1.4–7.7)
Neutrophils Relative %: 42.4 % — ABNORMAL LOW (ref 43.0–77.0)
Platelets: 262 10*3/uL (ref 150.0–400.0)
RBC: 5.03 Mil/uL (ref 3.87–5.11)
RDW: 14.3 % (ref 11.5–15.5)
WBC: 8.3 10*3/uL (ref 4.0–10.5)

## 2018-03-07 LAB — LIPID PANEL
CHOL/HDL RATIO: 4
Cholesterol: 187 mg/dL (ref 0–200)
HDL: 42 mg/dL (ref >39.00)
LDL CALC: 127 mg/dL — AB (ref 0–99)
NONHDL: 145.42
TRIGLYCERIDES: 92 mg/dL (ref 0.0–149.0)
VLDL: 18.4 mg/dL (ref 0.0–40.0)

## 2018-03-07 LAB — HEMOGLOBIN A1C: Hgb A1c MFr Bld: 6.4 % (ref 4.6–6.5)

## 2018-03-07 LAB — TSH: TSH: 1.28 u[IU]/mL (ref 0.35–4.50)

## 2018-03-07 NOTE — Progress Notes (Signed)
Subjective:     Ebony Curtis is a 57 y.o. female and is here for a comprehensive physical exam. The patient reports no problems.--- pt did not want to do pap today  Social History   Socioeconomic History  . Marital status: Married    Spouse name: Not on file  . Number of children: Not on file  . Years of education: Not on file  . Highest education level: Not on file  Occupational History  . Occupation: disable    Fish farm manager: [employer]    Comment: disability/ retired  Scientific laboratory technician  . Financial resource strain: Not on file  . Food insecurity:    Worry: Not on file    Inability: Not on file  . Transportation needs:    Medical: Not on file    Non-medical: Not on file  Tobacco Use  . Smoking status: Current Every Day Smoker    Packs/day: 1.00    Years: 35.00    Pack years: 35.00    Types: Cigarettes  . Smokeless tobacco: Never Used  Substance and Sexual Activity  . Alcohol use: Not Currently    Comment: 06/01/2016 "last drink was 10/2015"  . Drug use: No  . Sexual activity: Yes    Partners: Male  Lifestyle  . Physical activity:    Days per week: Not on file    Minutes per session: Not on file  . Stress: Not on file  Relationships  . Social connections:    Talks on phone: Not on file    Gets together: Not on file    Attends religious service: Not on file    Active member of club or organization: Not on file    Attends meetings of clubs or organizations: Not on file    Relationship status: Not on file  . Intimate partner violence:    Fear of current or ex partner: Not on file    Emotionally abused: Not on file    Physically abused: Not on file    Forced sexual activity: Not on file  Other Topics Concern  . Not on file  Social History Narrative   Exercise--goes to the [employer]  Topic Date Due  . COLONOSCOPY  03/29/2010  . OPHTHALMOLOGY EXAM  09/01/2017  . MAMMOGRAM  09/10/2017  . HEMOGLOBIN A1C  02/26/2018  . INFLUENZA VACCINE   11/05/2018 (Originally 11/04/2017)  . PNEUMOCOCCAL POLYSACCHARIDE VACCINE AGE 54-64 HIGH RISK  03/07/2020 (Originally 03/29/1963)  . PAP SMEAR  04/01/2018  . FOOT EXAM  08/27/2018  . URINE MICROALBUMIN  08/27/2018  . TETANUS/TDAP  08/27/2019  . Hepatitis C Screening  Completed  . HIV Screening  Completed    The following portions of the patient's history were reviewed and updated as appropriate: She  has a past medical history of Chronic lower back pain, DVT (deep venous thrombosis) (HCC) ("years ago"), Family history of adverse reaction to anesthesia, GERD (gastroesophageal reflux disease), Heart murmur, Hyperlipidemia, and Migraines. She does not have any pertinent problems on file. She  has a past surgical history that includes Tonsillectomy (1970s). Her family history includes Arthritis in her mother; Coronary artery disease in her unknown relative; Diabetes (age of onset: 78) in her sister; Heart disease in her mother and sister; Heart disease (age of onset: 19) in her father. She  reports that she has been smoking cigarettes. She has a 35.00 pack-year smoking history. She has never used smokeless tobacco. She reports that she drank alcohol. She reports  that she does not use drugs. She has a current medication list which includes the following prescription(s): albuterol sulfate, aspirin, cetirizine, diclofenac sodium, esomeprazole, fluticasone, garlic, glucose blood, ibuprofen, januvia, centrum silver 44+WNUUV, onetouch delica lancets fine, simvastatin, and vitamin e. Current Outpatient Medications on File Prior to Visit  Medication Sig Dispense Refill  . Albuterol Sulfate (PROAIR RESPICLICK) 253 (90 BASE) MCG/ACT AEPB Inhale 1 Inhaler into the lungs every 6 (six) hours as needed. 1 each 1  . aspirin 81 MG tablet Take 81 mg by mouth daily.    . cetirizine (ZYRTEC) 10 MG tablet Take 1 tablet (10 mg total) by mouth daily as needed for allergies. 30 tablet 11  . diclofenac sodium (VOLTAREN) 1 %  GEL Apply 2 g topically 4 (four) times daily. Prn back pain (Patient taking differently: Apply 2 g topically 4 (four) times daily as needed (for back pain). ) 100 g 5  . esomeprazole (NEXIUM) 40 MG capsule Take 1 capsule (40 mg total) by mouth daily before breakfast. 90 capsule 3  . fluticasone (FLONASE) 50 MCG/ACT nasal spray Place 2 sprays into both nostrils daily. 16 g 1  . GARLIC PO Take 1 capsule by mouth daily.    Marland Kitchen glucose blood test strip Use as instructed 100 each 12  . ibuprofen (ADVIL,MOTRIN) 200 MG tablet Take 400 mg by mouth every 6 (six) hours as needed (for back pain).    Marland Kitchen JANUVIA 100 MG tablet TAKE 1 TABLET(100 MG) BY MOUTH DAILY 90 tablet 0  . Multiple Vitamins-Minerals (CENTRUM SILVER 50+WOMEN) TABS Take 1 tablet by mouth daily.    Glory Rosebush DELICA LANCETS FINE MISC Use as directed once a day.  DX: E11.9 100 each 2  . simvastatin (ZOCOR) 40 MG tablet TAKE 1 TABLET BY MOUTH DAILY 90 tablet 0  . vitamin E (VITAMIN E) 400 UNIT capsule Take 400 Units by mouth daily.     No current facility-administered medications on file prior to visit.    She has No Known Allergies..  Review of Systems Review of Systems  Constitutional: Negative for activity change, appetite change and fatigue.  HENT: Negative for hearing loss, congestion, tinnitus and ear discharge.  dentist q18m Eyes: Negative for visual disturbance (see optho q1y -- vision corrected to 20/20 with glasses).  Respiratory: Negative for cough, chest tightness and shortness of breath.   Cardiovascular: Negative for chest pain, palpitations and leg swelling.  Gastrointestinal: Negative for abdominal pain, diarrhea, constipation and abdominal distention.  Genitourinary: Negative for urgency, frequency, decreased urine volume and difficulty urinating.  Musculoskeletal: Negative for back pain, arthralgias and gait problem.  Skin: Negative for color change, pallor and rash.  Neurological: Negative for dizziness, light-headedness,  numbness and headaches.  Hematological: Negative for adenopathy. Does not bruise/bleed easily.  Psychiatric/Behavioral: Negative for suicidal ideas, confusion, sleep disturbance, self-injury, dysphoric mood, decreased concentration and agitation.    ;  Objective:    BP 139/68 (BP Location: Right Arm, Cuff Size: Large)   Pulse 79   Temp 98.7 F (37.1 C) (Oral)   Resp 16   Ht 5\' 1"  (1.549 m)   Wt 236 lb 6.4 oz (107.2 kg)   SpO2 97%   BMI 44.67 kg/m  General appearance: alert, cooperative, appears stated age and no distress Head: Normocephalic, without obvious abnormality, atraumatic Eyes: conjunctivae/corneas clear. PERRL, EOM's intact. Fundi benign. Ears: normal TM's and external ear canals both ears Nose: Nares normal. Septum midline. Mucosa normal. No drainage or sinus tenderness. Throat: lips, mucosa, and  tongue normal; teeth and gums normal Neck: no adenopathy, no carotid bruit, no JVD, supple, symmetrical, trachea midline and thyroid not enlarged, symmetric, no tenderness/mass/nodules Back: symmetric, no curvature. ROM normal. No CVA tenderness. Lungs: clear to auscultation bilaterally Breasts: normal appearance, no masses or tenderness Heart: regular rate and rhythm, S1, S2 normal, no murmur, click, rub or gallop Abdomen: soft, non-tender; bowel sounds normal; no masses,  no organomegaly Pelvic: deferred Extremities: extremities normal, atraumatic, no cyanosis or edema Pulses: 2+ and symmetric Skin: mult moles ---   Lymph nodes: Cervical, supraclavicular, and axillary nodes normal. Neurologic: Alert and oriented X 3, normal strength and tone. Normal symmetric reflexes. Normal coordination and gait    Diabetic Foot Exam - Simple   Simple Foot Form Diabetic Foot exam was performed with the following findings:  Yes 03/07/2018  1:00 PM  Visual Inspection No deformities, no ulcerations, no other skin breakdown bilaterally:  Yes Sensation Testing Intact to touch and  monofilament testing bilaterally:  Yes Pulse Check Posterior Tibialis and Dorsalis pulse intact bilaterally:  Yes Comments     Assessment:    Healthy female exam.      Plan:    pt refused pap today She also refused vaccines  See After Visit Summary for Counseling Recommendations    1. Suspicious nevus  - Ambulatory referral to Dermatology  2. Preventative health care See above - CBC with Differential/Platelet - Lipid panel - Hemoglobin A1c - Comprehensive metabolic panel - TSH  3. Type 2 diabetes mellitus with hyperglycemia, without long-term current use of insulin ([place]) .check labs  con't meds  hgba1c to be done, minimize simple carbs. Increase exercise as tolerated. Continue current meds   4. Hyperlipidemia associated with type 2 diabetes mellitus ([place]) Encouraged heart healthy diet, increase exercise, avoid trans fats, consider a krill oil cap daily - Lipid panel - Comprehensive metabolic panel  5. Essential hypertension Well controlled, no changes to meds. Encouraged heart healthy diet such as the DASH diet and exercise as tolerated.  - CBC with Differential/Platelet - Comprehensive metabolic panel

## 2018-03-07 NOTE — Patient Instructions (Signed)
Preventive Care 40-64 Years, Female Preventive care refers to lifestyle choices and visits with your health care provider that can promote health and wellness. What does preventive care include?  A yearly physical exam. This is also called an annual well check.  Dental exams once or twice a year.  Routine eye exams. Ask your health care provider how often you should have your eyes checked.  Personal lifestyle choices, including: ? Daily care of your teeth and gums. ? Regular physical activity. ? Eating a healthy diet. ? Avoiding tobacco and drug use. ? Limiting alcohol use. ? Practicing safe sex. ? Taking low-dose aspirin daily starting at age 58. ? Taking vitamin and mineral supplements as recommended by your health care provider. What happens during an annual well check? The services and screenings done by your health care provider during your annual well check will depend on your age, overall health, lifestyle risk factors, and family history of disease. Counseling Your health care provider may ask you questions about your:  Alcohol use.  Tobacco use.  Drug use.  Emotional well-being.  Home and relationship well-being.  Sexual activity.  Eating habits.  Work and work Statistician.  Method of birth control.  Menstrual cycle.  Pregnancy history.  Screening You may have the following tests or measurements:  Height, weight, and BMI.  Blood pressure.  Lipid and cholesterol levels. These may be checked every 5 years, or more frequently if you are over 81 years old.  Skin check.  Lung cancer screening. You may have this screening every year starting at age 78 if you have a 30-pack-year history of smoking and currently smoke or have quit within the past 15 years.  Fecal occult blood test (FOBT) of the stool. You may have this test every year starting at age 65.  Flexible sigmoidoscopy or colonoscopy. You may have a sigmoidoscopy every 5 years or a colonoscopy  every 10 years starting at age 30.  Hepatitis C blood test.  Hepatitis B blood test.  Sexually transmitted disease (STD) testing.  Diabetes screening. This is done by checking your blood sugar (glucose) after you have not eaten for a while (fasting). You may have this done every 1-3 years.  Mammogram. This may be done every 1-2 years. Talk to your health care provider about when you should start having regular mammograms. This may depend on whether you have a family history of breast cancer.  BRCA-related cancer screening. This may be done if you have a family history of breast, ovarian, tubal, or peritoneal cancers.  Pelvic exam and Pap test. This may be done every 3 years starting at age 80. Starting at age 36, this may be done every 5 years if you have a Pap test in combination with an HPV test.  Bone density scan. This is done to screen for osteoporosis. You may have this scan if you are at high risk for osteoporosis.  Discuss your test results, treatment options, and if necessary, the need for more tests with your health care provider. Vaccines Your health care provider may recommend certain vaccines, such as:  Influenza vaccine. This is recommended every year.  Tetanus, diphtheria, and acellular pertussis (Tdap, Td) vaccine. You may need a Td booster every 10 years.  Varicella vaccine. You may need this if you have not been vaccinated.  Zoster vaccine. You may need this after age 5.  Measles, mumps, and rubella (MMR) vaccine. You may need at least one dose of MMR if you were born in  1957 or later. You may also need a second dose.  Pneumococcal 13-valent conjugate (PCV13) vaccine. You may need this if you have certain conditions and were not previously vaccinated.  Pneumococcal polysaccharide (PPSV23) vaccine. You may need one or two doses if you smoke cigarettes or if you have certain conditions.  Meningococcal vaccine. You may need this if you have certain  conditions.  Hepatitis A vaccine. You may need this if you have certain conditions or if you travel or work in places where you may be exposed to hepatitis A.  Hepatitis B vaccine. You may need this if you have certain conditions or if you travel or work in places where you may be exposed to hepatitis B.  Haemophilus influenzae type b (Hib) vaccine. You may need this if you have certain conditions.  Talk to your health care provider about which screenings and vaccines you need and how often you need them. This information is not intended to replace advice given to you by your health care provider. Make sure you discuss any questions you have with your health care provider. Document Released: 04/19/2015 Document Revised: 12/11/2015 Document Reviewed: 01/22/2015 Elsevier Interactive Patient Education  2018 Elsevier Inc.  

## 2018-03-10 ENCOUNTER — Encounter: Payer: Federal, State, Local not specified - PPO | Admitting: Family Medicine

## 2018-03-24 DIAGNOSIS — E1169 Type 2 diabetes mellitus with other specified complication: Secondary | ICD-10-CM

## 2018-03-24 DIAGNOSIS — I10 Essential (primary) hypertension: Secondary | ICD-10-CM

## 2018-03-24 DIAGNOSIS — E785 Hyperlipidemia, unspecified: Principal | ICD-10-CM

## 2018-03-24 MED ORDER — ROSUVASTATIN CALCIUM 20 MG PO TABS: 20.0000 mg | ORAL_TABLET | Freq: Every day | ORAL | 3 refills | Status: DC

## 2018-03-26 ENCOUNTER — Other Ambulatory Visit: Payer: Self-pay | Admitting: Family Medicine

## 2018-03-31 ENCOUNTER — Ambulatory Visit: Payer: Self-pay

## 2018-03-31 NOTE — Telephone Encounter (Signed)
Returned call to pharmacy. Spoke with [place], RPh. Reported [facility] rec'd order for Rosuvastatin 20 mg qd on 03/24/18, and then rec'd Rx for refill on Simvastatin 40 mg. qd, on 03/28/18.  Pharmacist requested clarification on which medication the provider is recommending.  Advised that per lab result note on 03/08/18, Dr. Carollee Herter  recommended to d/c Simvastatin, and to start Rosuvastatin.  Per Ebony Curtis, [place] she will inform pt. that she is to continue taking Rosuvastatin and stop Simvastatin.    Attempted to call pt. To clarify if she rec'd lab results from 03/07/18, and understood change in cholesterol medication.  Left vm to call back to speak with Triage nurse, if she had any questions.    Reason for Disposition . Pharmacy calling with prescription question and triager answers question  Answer Assessment - Initial Assessment Questions 1. SYMPTOMS: "Do you have any symptoms?"     no 2. SEVERITY: If symptoms are present, ask "Are they mild, moderate or severe?"     N/a   Requested clarification on which cholesterol medication the pt. Should be taking.  Protocols used: MEDICATION QUESTION CALL-A-AH  Message from [facility] sent at 03/31/2018 10:29 AM EST   Ebony Curtis with [facility] stated they received the following prescriptions: rosuvastatin (CRESTOR) 20 MG tablet and simvastatin (ZOCOR) 40 MG tablet and she would like clarification on which medication pt should be taking. Cb# [phone]

## 2018-03-31 NOTE — Telephone Encounter (Signed)
Please advise 

## 2018-03-31 NOTE — Telephone Encounter (Signed)
Author phoned [facility] pharmacy to clarify that rosuvastatin is the active medication per Dr. Nani Ravens; pharmacist verbalized understanding.

## 2018-03-31 NOTE — Addendum Note (Signed)
Addended by: Ames Coupe on: 03/31/2018 02:38 PM   Modules accepted: Orders

## 2018-03-31 NOTE — Telephone Encounter (Signed)
D/c'd Zocor in accordance with note on 12/2. Go with Crestor. TY.

## 2018-05-25 ENCOUNTER — Other Ambulatory Visit: Payer: Self-pay | Admitting: Family Medicine

## 2018-05-25 DIAGNOSIS — E1165 Type 2 diabetes mellitus with hyperglycemia: Secondary | ICD-10-CM

## 2018-08-23 ENCOUNTER — Other Ambulatory Visit: Payer: Self-pay | Admitting: Family Medicine

## 2018-08-23 DIAGNOSIS — E1165 Type 2 diabetes mellitus with hyperglycemia: Secondary | ICD-10-CM

## 2018-09-09 ENCOUNTER — Encounter: Payer: Self-pay | Admitting: Family Medicine

## 2018-09-09 ENCOUNTER — Ambulatory Visit (INDEPENDENT_AMBULATORY_CARE_PROVIDER_SITE_OTHER): Payer: Federal, State, Local not specified - PPO | Admitting: Family Medicine

## 2018-09-09 ENCOUNTER — Other Ambulatory Visit: Payer: Self-pay | Admitting: Family Medicine

## 2018-09-09 ENCOUNTER — Other Ambulatory Visit: Payer: Self-pay

## 2018-09-09 DIAGNOSIS — M549 Dorsalgia, unspecified: Secondary | ICD-10-CM | POA: Diagnosis not present

## 2018-09-09 DIAGNOSIS — M25511 Pain in right shoulder: Secondary | ICD-10-CM

## 2018-09-09 DIAGNOSIS — G8929 Other chronic pain: Secondary | ICD-10-CM | POA: Diagnosis not present

## 2018-09-09 DIAGNOSIS — J302 Other seasonal allergic rhinitis: Secondary | ICD-10-CM

## 2018-09-09 MED ORDER — DICLOFENAC SODIUM 1 % TD GEL: 2.0000 g | Freq: Four times a day (QID) | TRANSDERMAL | 5 refills | Status: AC | PRN

## 2018-09-09 NOTE — Progress Notes (Signed)
Chief Complaint  Patient presents with  . Back Pain    Subjective: Patient is a 57 y.o. female here for back/shoulder pain f/u. Due to COVID-19 pandemic, we are interacting via web portal for an electronic face-to-face visit. I verified patient's ID using 2 identifiers. Patient agreed to proceed with visit via this method. Patient is at home, I am at office. Patient and I are present for visit.   Pt has a hx of R shoulder pain and chronic low back pain. This happened years ago while at work. She will have intermittent flares. She had been using ibuprofen, but due to COVID-19 concerns, she would like to go back on the topical NSAID Voltaren. She has currently run out. This was both effective and devoid of AE's to her knowledge. She has received stretches/exercises in past. No recent inj or change in activity. No neurologic s/s's.   ROS: MSK: As noted in HPI  Past Medical History:  Diagnosis Date  . Chronic lower back pain   . DVT (deep venous thrombosis) ([place]) "years ago"   RUE  . Family history of adverse reaction to anesthesia    "sister's heart went out doing routine OR; she never woke up" (06/01/2016)  . GERD (gastroesophageal reflux disease)   . Heart murmur    "when I was young"  . Hyperlipidemia   . Migraines    "none in years" (06/01/2016)    Objective: No conversational dyspnea Age appropriate judgment and insight Nml affect and mood  Assessment and Plan: Back pain, chronic - Plan: diclofenac sodium (VOLTAREN) 1 % GEL  Chronic right shoulder pain - Plan: diclofenac sodium (VOLTAREN) 1 % GEL  Refill gel. Heat. Stretches/exercises.  F/u w reg pcp prn.  The patient voiced understanding and agreement to the plan.  [place], DO 09/09/18  11:49 AM

## 2018-10-29 ENCOUNTER — Other Ambulatory Visit: Payer: Self-pay | Admitting: Family Medicine

## 2018-11-01 ENCOUNTER — Ambulatory Visit: Payer: Federal, State, Local not specified - PPO | Admitting: Family Medicine

## 2018-11-01 ENCOUNTER — Other Ambulatory Visit: Payer: Self-pay

## 2018-11-01 ENCOUNTER — Encounter: Payer: Self-pay | Admitting: Family Medicine

## 2018-11-01 VITALS — BP 162/82 | HR 75 | Temp 98.5°F | Resp 18 | Ht 61.0 in | Wt 250.6 lb

## 2018-11-01 DIAGNOSIS — E1165 Type 2 diabetes mellitus with hyperglycemia: Secondary | ICD-10-CM

## 2018-11-01 DIAGNOSIS — E785 Hyperlipidemia, unspecified: Secondary | ICD-10-CM

## 2018-11-01 DIAGNOSIS — I10 Essential (primary) hypertension: Secondary | ICD-10-CM

## 2018-11-01 DIAGNOSIS — E1169 Type 2 diabetes mellitus with other specified complication: Secondary | ICD-10-CM

## 2018-11-01 NOTE — Assessment & Plan Note (Addendum)
Well controlled, no changes to meds. Encouraged heart healthy diet such as the DASH diet and exercise as tolerated. ---slightly high today Pt feels this is from a high salt dinner 2 nights ago She will check more frequently at home and let us know if the bp remains high

## 2018-11-01 NOTE — Patient Instructions (Signed)
Carbohydrate Counting for Diabetes Mellitus, Adult  Carbohydrate counting is a method of keeping track of how many carbohydrates you eat. Eating carbohydrates naturally increases the amount of sugar (glucose) in the blood. Counting how many carbohydrates you eat helps keep your blood glucose within normal limits, which helps you manage your diabetes (diabetes mellitus). It is important to know how many carbohydrates you can safely have in each meal. This is different for every person. A diet and nutrition specialist (registered dietitian) can help you make a meal plan and calculate how many carbohydrates you should have at each meal and snack. Carbohydrates are found in the following foods:  Grains, such as breads and cereals.  Dried beans and soy products.  Starchy vegetables, such as potatoes, peas, and corn.  Fruit and fruit juices.  Milk and yogurt.  Sweets and snack foods, such as cake, cookies, candy, chips, and soft drinks. How do I count carbohydrates? There are two ways to count carbohydrates in food. You can use either of the methods or a combination of both. Reading "Nutrition Facts" on packaged food The "Nutrition Facts" list is included on the labels of almost all packaged foods and beverages in the [place] It includes:  The serving size.  Information about nutrients in each serving, including the grams (g) of carbohydrate per serving. To use the "Nutrition Facts":  Decide how many servings you will have.  Multiply the number of servings by the number of carbohydrates per serving.  The resulting number is the total amount of carbohydrates that you will be having. Learning standard serving sizes of other foods When you eat carbohydrate foods that are not packaged or do not include "Nutrition Facts" on the label, you need to measure the servings in order to count the amount of carbohydrates:  Measure the foods that you will eat with a food scale or measuring cup, if needed.   Decide how many standard-size servings you will eat.  Multiply the number of servings by 15. Most carbohydrate-rich foods have about 15 g of carbohydrates per serving. ? For example, if you eat 8 oz (170 g) of strawberries, you will have eaten 2 servings and 30 g of carbohydrates (2 servings x 15 g = 30 g).  For foods that have more than one food mixed, such as soups and casseroles, you must count the carbohydrates in each food that is included. The following list contains standard serving sizes of common carbohydrate-rich foods. Each of these servings has about 15 g of carbohydrates:   hamburger bun or  English muffin.   oz (15 mL) syrup.   oz (14 g) jelly.  1 slice of bread.  1 six-inch tortilla.  3 oz (85 g) cooked rice or pasta.  4 oz (113 g) cooked dried beans.  4 oz (113 g) starchy vegetable, such as peas, corn, or potatoes.  4 oz (113 g) hot cereal.  4 oz (113 g) mashed potatoes or  of a large baked potato.  4 oz (113 g) canned or frozen fruit.  4 oz (120 mL) fruit juice.  4-6 crackers.  6 chicken nuggets.  6 oz (170 g) unsweetened dry cereal.  6 oz (170 g) plain fat-free yogurt or yogurt sweetened with artificial sweeteners.  8 oz (240 mL) milk.  8 oz (170 g) fresh fruit or one small piece of fruit.  24 oz (680 g) popped popcorn. Example of carbohydrate counting Sample meal  3 oz (85 g) chicken breast.  6 oz (170 g)   brown rice.  4 oz (113 g) corn.  8 oz (240 mL) milk.  8 oz (170 g) strawberries with sugar-free whipped topping. Carbohydrate calculation 1. Identify the foods that contain carbohydrates: ? Rice. ? Corn. ? Milk. ? Strawberries. 2. Calculate how many servings you have of each food: ? 2 servings rice. ? 1 serving corn. ? 1 serving milk. ? 1 serving strawberries. 3. Multiply each number of servings by 15 g: ? 2 servings rice x 15 g = 30 g. ? 1 serving corn x 15 g = 15 g. ? 1 serving milk x 15 g = 15 g. ? 1 serving  strawberries x 15 g = 15 g. 4. Add together all of the amounts to find the total grams of carbohydrates eaten: ? 30 g + 15 g + 15 g + 15 g = 75 g of carbohydrates total. Summary  Carbohydrate counting is a method of keeping track of how many carbohydrates you eat.  Eating carbohydrates naturally increases the amount of sugar (glucose) in the blood.  Counting how many carbohydrates you eat helps keep your blood glucose within normal limits, which helps you manage your diabetes.  A diet and nutrition specialist (registered dietitian) can help you make a meal plan and calculate how many carbohydrates you should have at each meal and snack. This information is not intended to replace advice given to you by your health care provider. Make sure you discuss any questions you have with your health care provider. Document Released: 03/23/2005 Document Revised: 10/15/2016 Document Reviewed: 09/04/2015 Elsevier Patient Education  2020 Elsevier Inc.  

## 2018-11-01 NOTE — Progress Notes (Signed)
Patient ID: Ebony Curtis, female    DOB: 13-Dec-1960  Age: 58 y.o. MRN: [id number]    Subjective:  Subjective  HPI Ebony Curtis presents for f/u dm, chol and bp.   No complaints   HYPERTENSION   Blood pressure range- a little high today due to salt   Chest pain- no      Dyspnea- no Lightheadedness- no   Edema- no  Other side effects - no   Medication compliance: good Low salt diet-  no   DIABETES    Blood Sugar ranges-  Polyuria- no New Visual problems- no  Hypoglycemic symptoms- no  Other side effects-no Medication compliance - good  Last eye exam- last year Foot exam- today   HYPERLIPIDEMIA  Medication compliance- good  RUQ pain- no  Muscle aches- no Other side effects-no       Review of Systems Review of Systems  Constitutional: Negative for activity change, appetite change and fatigue.  HENT: Negative for hearing loss, congestion, tinnitus and ear discharge.  dentist q44m Eyes: Negative for visual disturbance (see optho q1y -- vision corrected to 20/20 with glasses).  Respiratory: Negative for cough, chest tightness and shortness of breath.   Cardiovascular: Negative for chest pain, palpitations and leg swelling.  Gastrointestinal: Negative for abdominal pain, diarrhea, constipation and abdominal distention.  Genitourinary: Negative for urgency, frequency, decreased urine volume and difficulty urinating.  Musculoskeletal: Negative for back pain, arthralgias and gait problem.  Skin: Negative for color change, pallor and rash.  Neurological: Negative for dizziness, light-headedness, numbness and headaches.  Hematological: Negative for adenopathy. Does not bruise/bleed easily.  Psychiatric/Behavioral: Negative for suicidal ideas, confusion, sleep disturbance, self-injury, dysphoric mood, decreased concentration and agitation.      History Past Medical History:  Diagnosis Date   Chronic lower back pain    DVT (deep venous thrombosis) ([place]) "years  ago"   RUE   Family history of adverse reaction to anesthesia    "sister's heart went out doing routine OR; she never woke up" (06/01/2016)   GERD (gastroesophageal reflux disease)    Heart murmur    "when I was young"   Hyperlipidemia    Migraines    "none in years" (06/01/2016)    She has a past surgical history that includes Tonsillectomy (1970s).   Her family history includes Arthritis in her mother; Coronary artery disease in her unknown relative; Diabetes (age of onset: 28) in her sister; Heart disease in her mother and sister; Heart disease (age of onset: 67) in her father.She reports that she has been smoking cigarettes. She has a 35.00 pack-year smoking history. She has never used smokeless tobacco. She reports previous alcohol use. She reports that she does not use drugs.  Current Outpatient Medications on File Prior to Visit  Medication Sig Dispense Refill   Albuterol Sulfate (PROAIR RESPICLICK) 740 (90 BASE) MCG/ACT AEPB Inhale 1 Inhaler into the lungs every 6 (six) hours as needed. 1 each 1   aspirin 81 MG tablet Take 81 mg by mouth daily.     cetirizine (ZYRTEC) 10 MG tablet TAKE 1 TABLET(10 MG) BY MOUTH DAILY AS NEEDED FOR ALLERGIES 30 tablet 11   diclofenac sodium (VOLTAREN) 1 % GEL Apply 2 g topically 4 (four) times daily as needed (for back pain). 100 g 5   esomeprazole (NEXIUM) 40 MG capsule Take 1 capsule (40 mg total) by mouth daily before breakfast. 90 capsule 3   fluticasone (FLONASE) 50 MCG/ACT nasal spray Place 2 sprays into both nostrils daily.  16 g 1   GARLIC PO Take 1 capsule by mouth daily.     glucose blood test strip Use as instructed 100 each 12   ibuprofen (ADVIL,MOTRIN) 200 MG tablet Take 400 mg by mouth every 6 (six) hours as needed (for back pain).     JANUVIA 100 MG tablet TAKE 1 TABLET(100 MG) BY MOUTH DAILY 90 tablet 0   Multiple Vitamins-Minerals (CENTRUM SILVER 50+WOMEN) TABS Take 1 tablet by mouth daily.     ONETOUCH DELICA LANCETS  FINE MISC Use as directed once a day.  DX: E11.9 100 each 2   rosuvastatin (CRESTOR) 20 MG tablet TAKE 1 TABLET(20 MG) BY MOUTH DAILY 30 tablet 3   vitamin E (VITAMIN E) 400 UNIT capsule Take 400 Units by mouth daily.     No current facility-administered medications on file prior to visit.      Objective:  Objective  Physical Exam Vitals signs and nursing note reviewed.  Constitutional:      Appearance: She is well-developed.  HENT:     Head: Normocephalic and atraumatic.  Eyes:     Conjunctiva/sclera: Conjunctivae normal.  Neck:     Musculoskeletal: Normal range of motion and neck supple.     Thyroid: No thyromegaly.     Vascular: No carotid bruit or JVD.  Cardiovascular:     Rate and Rhythm: Normal rate and regular rhythm.     Heart sounds: Normal heart sounds. No murmur.  Pulmonary:     Effort: Pulmonary effort is normal. No respiratory distress.     Breath sounds: Normal breath sounds. No wheezing or rales.  Chest:     Chest wall: No tenderness.  Neurological:     Mental Status: She is alert and oriented to person, place, and time.    Diabetic Foot Exam - Simple   Simple Foot Form Diabetic Foot exam was performed with the following findings: Yes 11/01/2018 10:37 AM  Visual Inspection No deformities, no ulcerations, no other skin breakdown bilaterally: Yes Sensation Testing Intact to touch and monofilament testing bilaterally: Yes Pulse Check Posterior Tibialis and Dorsalis pulse intact bilaterally: Yes Comments     BP (!) 162/82 (BP Location: Left Arm, Patient Position: Sitting, Cuff Size: Large)    Pulse 75    Temp 98.5 F (36.9 C) (Oral)    Resp 18    Ht 5\' 1"  (1.549 m)    Wt 250 lb 9.6 oz (113.7 kg)    SpO2 99%    BMI 47.35 kg/m  Wt Readings from Last 3 Encounters:  11/01/18 250 lb 9.6 oz (113.7 kg)  03/07/18 236 lb 6.4 oz (107.2 kg)  08/26/17 233 lb 9.6 oz (106 kg)     Lab Results  Component Value Date   WBC 8.3 03/07/2018   HGB 15.4 (H) 03/07/2018     HCT 46.6 (H) 03/07/2018   PLT 262.0 03/07/2018   GLUCOSE 92 03/07/2018   CHOL 187 03/07/2018   TRIG 92.0 03/07/2018   HDL 42.00 03/07/2018   LDLDIRECT 153.1 03/15/2012   LDLCALC 127 (H) 03/07/2018   ALT 14 03/07/2018   AST 12 03/07/2018   NA 139 03/07/2018   K 4.7 03/07/2018   CL 105 03/07/2018   CREATININE 0.88 03/07/2018   BUN 15 03/07/2018   CO2 26 03/07/2018   TSH 1.28 03/07/2018   HGBA1C 6.4 03/07/2018   MICROALBUR 0.9 08/26/2017    Dg Bone Density  Result Date: 09/10/2016 EXAM: DUAL X-RAY ABSORPTIOMETRY (DXA) FOR BONE MINERAL DENSITY  IMPRESSION: Referring Physician:  Rosalita Chessman CHASE PATIENT: Name: Lolamae, Voisin Patient ID: [id number] Birth Date: 09/17/1960 Height: 62.5 in. Sex: Female Measured: 09/10/2016 Weight: 241.3 lbs. Indications: African-American, Diabetic, Estrogen Deficiency, Post Menopausal, Tobacco User(Current) Fractures: Treatments: Januvia, Multivitamin, Vitamin D ASSESSMENT: The BMD measured at Femur Neck Left is 1.120 g/cm2 with a T-score of 0.6. This patient is considered normal according to [facility]) criteria. Site Region Measured Date Measured Age WHO YA BMD Classification T-score AP Spine L1-L4 09[age]10[age]2016 56.4 Normal 1.4 1.352 g/cm2 DualFemur Neck Left 8/10/[age] 56.4 [age] Normal 0.6 1.120 g/cm2 World Health Organization [facility]) criteria for post-menopausal, Caucasian Women: Normal       T-score at or above -1 SD Osteopenia   T-score between -1 and -2.5 SD Osteoporosis T-score at or below -2.5 SD RECOMMENDATION: [place] recommends that FDA-approved medical therapies be considered in postmenopausal women and men age 59 or older with a: 1. Hip or vertebral (clinical or morphometric) fracture. 2. T-score of < -2.5 at the spine or hip. 3. Ten-year fracture probability by FRAX of 3% or greater for hip fracture or 20% or greater for major osteoporotic fracture. All treatment decisions require clinical judgment and  consideration of individual patient factors, including patient preferences, co-morbidities, previous drug use, risk factors not captured in the FRAX model (e.g. falls, vitamin D deficiency, increased bone turnover, interval significant decline in bone density) and possible under - or over-estimation of fracture risk by FRAX. All patients should ensure an adequate intake of dietary calcium (1200 mg/d) and vitamin D (800 IU daily) unless contraindicated. FOLLOW-UP: People with diagnosed cases of osteoporosis or at high risk for fracture should have regular bone mineral density tests. For patients eligible for Medicare, routine testing is allowed once every 2 years. The testing frequency can be increased to one year for patients who have rapidly progressing disease, those who are receiving or discontinuing medical therapy to restore bone mass, or have additional risk factors. I have reviewed this report and agree with the above findings. [facility] Electronically Signed   By: Lowella Grip III M.D.   On: 09/10/2016 11:02   Mm Screening Breast Tomo Bilateral  Result Date: 09/11/2016 CLINICAL DATA:  Screening. EXAM: 2D DIGITAL SCREENING BILATERAL MAMMOGRAM WITH CAD AND ADJUNCT TOMO COMPARISON:  Previous exam(s). ACR Breast Density Category a: The breast tissue is almost entirely fatty. FINDINGS: There are no findings suspicious for malignancy. Images were processed with CAD. IMPRESSION: No mammographic evidence of malignancy. A result letter of this screening mammogram will be mailed directly to the patient. RECOMMENDATION: Screening mammogram in one year. (Code:SM-B-01Y) BI-RADS CATEGORY  1: Negative. Electronically Signed   By: Trude Mcburney M.D.   On: 09/11/2016 10:00     Assessment & Plan:  Plan  I am having Gala Murdoch maintain her esomeprazole, aspirin, Albuterol Sulfate, ibuprofen, Centrum Silver 93+YBOFB, GARLIC PO, vitamin E, fluticasone, glucose blood, OneTouch Delica Lancets  Fine, Januvia, cetirizine, diclofenac sodium, and rosuvastatin.  No orders of the defined types were placed in this encounter.   Problem List Items Addressed This Visit      Unprioritized   Controlled type 2 diabetes mellitus with hyperglycemia, without long-term current use of insulin ([place])    hgba1c to be checked , minimize simple carbs. Increase exercise as tolerated. Continue current meds       Essential hypertension    Well controlled, no changes to meds. Encouraged heart healthy diet such as the DASH diet and exercise as  tolerated. ---slightly high today Pt feels this is from a high salt dinner 2 nights ago She will check more frequently at home and let us know if the bp remains high      Relevant Orders   Hemoglobin A1c   Comprehensive metabolic panel   Hyperlipidemia LDL goal <70    Tolerating statin, encouraged heart healthy diet, avoid trans fats, minimize simple carbs and saturated fats. Increase exercise as tolerated       Other Visit Diagnoses    Uncontrolled type 2 diabetes mellitus with hyperglycemia ([place])    -  Primary   Relevant Orders   Hemoglobin A1c   Comprehensive metabolic panel   Hyperlipidemia associated with type 2 diabetes mellitus ([place])       Relevant Orders   Lipid panel      Follow-up: Return in about 6 months (around 05/04/2019), or if symptoms worsen or fail to improve, for annual exam, fasting.  Ann Held, DO

## 2018-11-01 NOTE — Assessment & Plan Note (Signed)
Tolerating statin, encouraged heart healthy diet, avoid trans fats, minimize simple carbs and saturated fats. Increase exercise as tolerated 

## 2018-11-01 NOTE — Assessment & Plan Note (Signed)
hgba1c to be checked, minimize simple carbs. Increase exercise as tolerated. Continue current meds  

## 2018-11-08 ENCOUNTER — Other Ambulatory Visit: Payer: Self-pay

## 2018-11-08 ENCOUNTER — Other Ambulatory Visit (INDEPENDENT_AMBULATORY_CARE_PROVIDER_SITE_OTHER): Payer: Federal, State, Local not specified - PPO

## 2018-11-08 DIAGNOSIS — E1169 Type 2 diabetes mellitus with other specified complication: Secondary | ICD-10-CM

## 2018-11-08 DIAGNOSIS — E1165 Type 2 diabetes mellitus with hyperglycemia: Secondary | ICD-10-CM

## 2018-11-08 DIAGNOSIS — E785 Hyperlipidemia, unspecified: Secondary | ICD-10-CM

## 2018-11-08 DIAGNOSIS — I10 Essential (primary) hypertension: Secondary | ICD-10-CM | POA: Diagnosis not present

## 2018-11-08 LAB — COMPREHENSIVE METABOLIC PANEL
ALT: 13 U/L (ref 0–35)
AST: 12 U/L (ref 0–37)
Albumin: 4.3 g/dL (ref 3.5–5.2)
Alkaline Phosphatase: 60 U/L (ref 39–117)
BUN: 10 mg/dL (ref 6–23)
CO2: 29 mEq/L (ref 19–32)
Calcium: 9.5 mg/dL (ref 8.4–10.5)
Chloride: 106 mEq/L (ref 96–112)
Creatinine, Ser: 0.8 mg/dL (ref 0.40–1.20)
GFR: 88.95 mL/min (ref >60.00)
Glucose, Bld: 120 mg/dL — ABNORMAL HIGH (ref 70–99)
Potassium: 4.8 mEq/L (ref 3.5–5.1)
Sodium: 141 mEq/L (ref 135–145)
Total Bilirubin: 0.3 mg/dL (ref 0.2–1.2)
Total Protein: 7.2 g/dL (ref 6.0–8.3)

## 2018-11-08 LAB — LIPID PANEL
Cholesterol: 138 mg/dL (ref 0–200)
HDL: 39.4 mg/dL (ref >39.00)
LDL Cholesterol: 79 mg/dL (ref 0–99)
NonHDL: 98.82
Total CHOL/HDL Ratio: 4
Triglycerides: 99 mg/dL (ref 0.0–149.0)
VLDL: 19.8 mg/dL (ref 0.0–40.0)

## 2018-11-08 LAB — HEMOGLOBIN A1C: Hgb A1c MFr Bld: 6.9 % — ABNORMAL HIGH (ref 4.6–6.5)

## 2018-11-09 ENCOUNTER — Other Ambulatory Visit: Payer: Self-pay | Admitting: Family Medicine

## 2018-11-09 DIAGNOSIS — E1165 Type 2 diabetes mellitus with hyperglycemia: Secondary | ICD-10-CM

## 2018-11-09 DIAGNOSIS — E1169 Type 2 diabetes mellitus with other specified complication: Secondary | ICD-10-CM

## 2018-11-09 DIAGNOSIS — E785 Hyperlipidemia, unspecified: Secondary | ICD-10-CM

## 2018-12-06 ENCOUNTER — Other Ambulatory Visit: Payer: Self-pay | Admitting: *Deleted

## 2018-12-06 DIAGNOSIS — E1165 Type 2 diabetes mellitus with hyperglycemia: Secondary | ICD-10-CM

## 2018-12-06 MED ORDER — SITAGLIPTIN PHOSPHATE 100 MG PO TABS: ORAL_TABLET | ORAL | 1 refills | Status: DC

## 2019-05-02 ENCOUNTER — Other Ambulatory Visit: Payer: Self-pay | Admitting: *Deleted

## 2019-05-02 MED ORDER — ROSUVASTATIN CALCIUM 20 MG PO TABS: ORAL_TABLET | ORAL | 0 refills | Status: DC

## 2019-05-04 ENCOUNTER — Other Ambulatory Visit: Payer: Self-pay | Admitting: *Deleted

## 2019-05-04 MED ORDER — ROSUVASTATIN CALCIUM 20 MG PO TABS: ORAL_TABLET | ORAL | 1 refills | Status: DC

## 2019-05-05 ENCOUNTER — Encounter: Payer: Federal, State, Local not specified - PPO | Admitting: Family Medicine

## 2019-06-06 ENCOUNTER — Other Ambulatory Visit: Payer: Self-pay | Admitting: *Deleted

## 2019-06-06 DIAGNOSIS — E1165 Type 2 diabetes mellitus with hyperglycemia: Secondary | ICD-10-CM

## 2019-06-06 MED ORDER — SITAGLIPTIN PHOSPHATE 100 MG PO TABS: ORAL_TABLET | ORAL | 1 refills | Status: DC

## 2019-06-21 ENCOUNTER — Telehealth: Payer: Self-pay | Admitting: Family Medicine

## 2019-06-29 ENCOUNTER — Other Ambulatory Visit: Payer: Self-pay

## 2019-06-30 ENCOUNTER — Ambulatory Visit (INDEPENDENT_AMBULATORY_CARE_PROVIDER_SITE_OTHER): Payer: Federal, State, Local not specified - PPO | Admitting: Family Medicine

## 2019-06-30 ENCOUNTER — Encounter: Payer: Self-pay | Admitting: Family Medicine

## 2019-06-30 VITALS — BP 128/84 | HR 84 | Temp 97.1°F | Resp 18 | Ht 61.0 in | Wt 242.2 lb

## 2019-06-30 DIAGNOSIS — E1165 Type 2 diabetes mellitus with hyperglycemia: Secondary | ICD-10-CM

## 2019-06-30 DIAGNOSIS — E1169 Type 2 diabetes mellitus with other specified complication: Secondary | ICD-10-CM

## 2019-06-30 DIAGNOSIS — Z0000 Encounter for general adult medical examination without abnormal findings: Secondary | ICD-10-CM

## 2019-06-30 DIAGNOSIS — I10 Essential (primary) hypertension: Secondary | ICD-10-CM | POA: Diagnosis not present

## 2019-06-30 DIAGNOSIS — M549 Dorsalgia, unspecified: Secondary | ICD-10-CM

## 2019-06-30 DIAGNOSIS — E785 Hyperlipidemia, unspecified: Secondary | ICD-10-CM | POA: Diagnosis not present

## 2019-06-30 LAB — MICROALBUMIN / CREATININE URINE RATIO
Creatinine,U: 123.7 mg/dL
Microalb Creat Ratio: 1.4 mg/g (ref 0.0–30.0)
Microalb, Ur: 1.8 mg/dL (ref 0.0–1.9)

## 2019-06-30 LAB — CBC WITH DIFFERENTIAL/PLATELET
Basophils Absolute: 0.1 10*3/uL (ref 0.0–0.1)
Basophils Relative: 1.2 % (ref 0.0–3.0)
Eosinophils Absolute: 0.1 10*3/uL (ref 0.0–0.7)
Eosinophils Relative: 1.2 % (ref 0.0–5.0)
HCT: 43.4 % (ref 36.0–46.0)
Hemoglobin: 14.6 g/dL (ref 12.0–15.0)
Lymphocytes Relative: 45.3 % (ref 12.0–46.0)
Lymphs Abs: 4.1 10*3/uL — ABNORMAL HIGH (ref 0.7–4.0)
MCHC: 33.6 g/dL (ref 30.0–36.0)
MCV: 92.7 fl (ref 78.0–100.0)
Monocytes Absolute: 0.5 10*3/uL (ref 0.1–1.0)
Monocytes Relative: 5.5 % (ref 3.0–12.0)
Neutro Abs: 4.2 10*3/uL (ref 1.4–7.7)
Neutrophils Relative %: 46.8 % (ref 43.0–77.0)
Platelets: 271 10*3/uL (ref 150.0–400.0)
RBC: 4.68 Mil/uL (ref 3.87–5.11)
RDW: 13.8 % (ref 11.5–15.5)
WBC: 9 10*3/uL (ref 4.0–10.5)

## 2019-06-30 LAB — COMPREHENSIVE METABOLIC PANEL
ALT: 11 U/L (ref 0–35)
AST: 13 U/L (ref 0–37)
Albumin: 4.3 g/dL (ref 3.5–5.2)
Alkaline Phosphatase: 56 U/L (ref 39–117)
BUN: 11 mg/dL (ref 6–23)
CO2: 30 mEq/L (ref 19–32)
Calcium: 9.7 mg/dL (ref 8.4–10.5)
Chloride: 104 mEq/L (ref 96–112)
Creatinine, Ser: 0.88 mg/dL (ref 0.40–1.20)
GFR: 79.51 mL/min (ref >60.00)
Glucose, Bld: 109 mg/dL — ABNORMAL HIGH (ref 70–99)
Potassium: 4.7 mEq/L (ref 3.5–5.1)
Sodium: 139 mEq/L (ref 135–145)
Total Bilirubin: 0.3 mg/dL (ref 0.2–1.2)
Total Protein: 7.3 g/dL (ref 6.0–8.3)

## 2019-06-30 LAB — LIPID PANEL
Cholesterol: 140 mg/dL (ref 0–200)
HDL: 37.8 mg/dL — ABNORMAL LOW (ref >39.00)
LDL Cholesterol: 85 mg/dL (ref 0–99)
NonHDL: 101.89
Total CHOL/HDL Ratio: 4
Triglycerides: 85 mg/dL (ref 0.0–149.0)
VLDL: 17 mg/dL (ref 0.0–40.0)

## 2019-06-30 LAB — HEMOGLOBIN A1C: Hgb A1c MFr Bld: 6.7 % — ABNORMAL HIGH (ref 4.6–6.5)

## 2019-06-30 NOTE — Patient Instructions (Signed)

## 2019-06-30 NOTE — Progress Notes (Signed)
Subjective:     Ebony Curtis is a 59 y.o. female and is here for a comprehensive physical exam. The patient reports no problems.  Social History   Socioeconomic History  . Marital status: Married    Spouse name: Not on file  . Number of children: Not on file  . Years of education: Not on file  . Highest education level: Not on file  Occupational History  . Occupation: disable    Fish farm manager: [employer]    Comment: disability/ retired  Tobacco Use  . Smoking status: Current Every Day Smoker    Packs/day: 1.00    Years: 35.00    Pack years: 35.00    Types: Cigarettes  . Smokeless tobacco: Never Used  Substance and Sexual Activity  . Alcohol use: Not Currently    Comment: 06/01/2016 "last drink was 10/2015"  . Drug use: No  . Sexual activity: Yes    Partners: Male  Other Topics Concern  . Not on file  Social History Narrative   Exercise--goes to the [employer]   Social Determinants of Health   Financial Resource Strain:   . Difficulty of Paying Living Expenses:   Food Insecurity:   . Worried About Charity fundraiser in the Last Year:   . Arboriculturist in the Last Year:   Transportation Needs:   . Film/video editor (Medical):   Marland Kitchen Lack of Transportation (Non-Medical):   Physical Activity:   . Days of Exercise per Week:   . Minutes of Exercise per Session:   Stress:   . Feeling of Stress :   Social Connections:   . Frequency of Communication with Friends and Family:   . Frequency of Social Gatherings with Friends and Family:   . Attends Religious Services:   . Active Member of Clubs or Organizations:   . Attends Archivist Meetings:   Marland Kitchen Marital Status:   Intimate Partner Violence:   . Fear of Current or Ex-Partner:   . Emotionally Abused:   Marland Kitchen Physically Abused:   . Sexually Abused:    Health Maintenance  Topic Date Due  . COLONOSCOPY  Never done  . OPHTHALMOLOGY EXAM  09/01/2017  . MAMMOGRAM  09/10/2017  . URINE MICROALBUMIN  08/27/2018  .  HEMOGLOBIN A1C  05/11/2019  . PAP SMEAR-Modifier  06/30/2019 (Originally 04/01/2018)  . INFLUENZA VACCINE  07/05/2019 (Originally 11/05/2018)  . PNEUMOCOCCAL POLYSACCHARIDE VACCINE AGE 32-64 HIGH RISK  03/07/2020 (Originally 03/29/1962)  . TETANUS/TDAP  08/27/2019  . FOOT EXAM  11/01/2019  . Hepatitis C Screening  Completed  . HIV Screening  Completed    The following portions of the patient's history were reviewed and updated as appropriate:  She  has a past medical history of Chronic lower back pain, DVT (deep venous thrombosis) ([facility]) ("years ago"), Family history of adverse reaction to anesthesia, GERD (gastroesophageal reflux disease), Heart murmur, Hyperlipidemia, and Migraines. She does not have any pertinent problems on file. She  has a past surgical history that includes Tonsillectomy (1970s). Her family history includes Arthritis in her mother; Coronary artery disease in an other family member; Diabetes (age of onset: 82) in her sister; Heart disease in her mother and sister; Heart disease (age of onset: 83) in her father. She  reports that she has been smoking cigarettes. She has a 35.00 pack-year smoking history. She has never used smokeless tobacco. She reports previous alcohol use. She reports that she does not use drugs. She has a current  medication list which includes the following prescription(s): albuterol sulfate, aspirin, cetirizine, diclofenac sodium, esomeprazole, fluticasone, garlic, glucose blood, ibuprofen, centrum silver [id number], onetouch delica lancets fine, rosuvastatin, sitagliptin, and vitamin e. Current Outpatient Medications on File Prior to Visit  Medication Sig Dispense Refill  . Albuterol Sulfate (PROAIR RESPICLICK) 123XX123 (90 BASE) MCG/ACT AEPB Inhale 1 Inhaler into the lungs every 6 (six) hours as needed. 1 each 1  . aspirin 81 MG tablet Take 81 mg by mouth daily.    . cetirizine (ZYRTEC) 10 MG tablet TAKE 1 TABLET(10 MG) BY MOUTH DAILY AS NEEDED FOR ALLERGIES 30  tablet 11  . diclofenac sodium (VOLTAREN) 1 % GEL Apply 2 g topically 4 (four) times daily as needed (for back pain). 100 g 5  . esomeprazole (NEXIUM) 40 MG capsule Take 1 capsule (40 mg total) by mouth daily before breakfast. 90 capsule 3  . fluticasone (FLONASE) 50 MCG/ACT nasal spray Place 2 sprays into both nostrils daily. 16 g 1  . GARLIC PO Take 1 capsule by mouth daily.    Marland Kitchen glucose blood test strip Use as instructed 100 each 12  . ibuprofen (ADVIL,MOTRIN) 200 MG tablet Take 400 mg by mouth every 6 (six) hours as needed (for back pain).    . Multiple Vitamins-Minerals (CENTRUM SILVER 50+WOMEN) TABS Take 1 tablet by mouth daily.    Glory Rosebush DELICA LANCETS FINE MISC Use as directed once a day.  DX: E11.9 100 each 2  . rosuvastatin (CRESTOR) 20 MG tablet TAKE 1 TABLET(20 MG) BY MOUTH DAILY 90 tablet 1  . sitaGLIPtin (JANUVIA) 100 MG tablet TAKE 1 TABLET(100 MG) BY MOUTH DAILY 90 tablet 1  . vitamin E (VITAMIN E) 400 UNIT capsule Take 400 Units by mouth daily.     No current facility-administered medications on file prior to visit.   She has No Known Allergies.. Health Maintenance  Topic Date Due  . COLONOSCOPY  Never done  . OPHTHALMOLOGY EXAM  09/01/2017  . MAMMOGRAM  09/10/2017  . URINE MICROALBUMIN  08/27/2018  . HEMOGLOBIN A1C  05/11/2019  . PAP SMEAR-Modifier  06/30/2019 (Originally 04/01/2018)  . INFLUENZA VACCINE  07/05/2019 (Originally 11/05/2018)  . PNEUMOCOCCAL POLYSACCHARIDE VACCINE AGE 40-64 HIGH RISK  03/07/2020 (Originally 03/29/1962)  . TETANUS/TDAP  08/27/2019  . FOOT EXAM  11/01/2019  . Hepatitis C Screening  Completed  . HIV Screening  Completed   Review of Systems  Review of Systems  Constitutional: Negative for activity change, appetite change and fatigue.  HENT: Negative for hearing loss, congestion, tinnitus and ear discharge.   Eyes: Negative for visual disturbance (see optho q1y -- vision corrected to 20/20 with glasses).  Respiratory: Negative for  cough, chest tightness and shortness of breath.   Cardiovascular: Negative for chest pain, palpitations and leg swelling.  Gastrointestinal: Negative for abdominal pain, diarrhea, constipation and abdominal distention.  Genitourinary: Negative for urgency, frequency, decreased urine volume and difficulty urinating.  Musculoskeletal: Negative for back pain, arthralgias and gait problem.  Skin: Negative for color change, pallor and rash.  Neurological: Negative for dizziness, light-headedness, numbness and headaches.  Hematological: Negative for adenopathy. Does not bruise/bleed easily.  Psychiatric/Behavioral: Negative for suicidal ideas, confusion, sleep disturbance, self-injury, dysphoric mood, decreased concentration and agitation.  Pt is able to read and write and can do all ADLs No risk for falling No abuse/ violence in home    Objective:    BP 128/84 (BP Location: Right Arm, Patient Position: Sitting, Cuff Size: Large)   Pulse 84  Temp (!) 97.1 F (36.2 C) (Temporal)   Resp 18   Ht 5\' 1"  (1.549 m)   Wt 242 lb 3.2 oz (109.9 kg)   SpO2 98%   BMI 45.76 kg/m  General appearance: alert, cooperative, appears stated age and no distress Head: Normocephalic, without obvious abnormality, atraumatic Eyes: negative findings: lids and lashes normal, conjunctivae and sclerae normal and pupils equal, round, reactive to light and accomodation Ears: normal TM's and external ear canals both ears Neck: no adenopathy, no carotid bruit, no JVD, supple, symmetrical, trachea midline and thyroid not enlarged, symmetric, no tenderness/mass/nodules Back: symmetric, no curvature. ROM normal. No CVA tenderness. Lungs: clear to auscultation bilaterally Breasts: normal appearance, no masses or tenderness Heart: regular rate and rhythm, S1, S2 normal, no murmur, click, rub or gallop Abdomen: soft, non-tender; bowel sounds normal; no masses,  no organomegaly Pelvic: not indicated; post-menopausal, no  abnormal Pap smears in past Extremities: extremities normal, atraumatic, no cyanosis or edema Pulses: 2+ and symmetric Skin: Skin color, texture, turgor normal. No rashes or lesions Lymph nodes: Cervical, supraclavicular, and axillary nodes normal. Neurologic: Alert and oriented X 3, normal strength and tone. Normal symmetric reflexes. Normal coordination and gait    Assessment:    Healthy female exam.      Plan:    ghm utd Check labs  See After Visit Summary for Counseling Recommendations    1. Uncontrolled type 2 diabetes mellitus with hyperglycemia ([place]) hgba1c to be checked , minimize simple carbs. Increase exercise as tolerated. Continue current meds  - Lipid panel - CBC with Differential/Platelet - Comprehensive metabolic panel - Hemoglobin A1c - Microalbumin / creatinine urine ratio  2. Essential hypertension Well controlled, no changes to meds. Encouraged heart healthy diet such as the DASH diet and exercise as tolerated.   - Comprehensive metabolic panel - Microalbumin / creatinine urine ratio  3. Hyperlipidemia associated with type 2 diabetes mellitus ([place]) Encouraged heart healthy diet, increase exercise, avoid trans fats, consider a krill oil cap daily  - Lipid panel - Comprehensive metabolic panel  4. Preventative health care See above  - Lipid panel - CBC with Differential/Platelet - Comprehensive metabolic panel - Hemoglobin A1c - Microalbumin / creatinine urine ratio  5. Mid back pain Check xray Consider pt -- she had dry needling in past which helped  - DG Thoracic Spine 2 View; Future

## 2019-10-03 ENCOUNTER — Telehealth: Payer: Self-pay | Admitting: Family Medicine

## 2019-10-03 DIAGNOSIS — J302 Other seasonal allergic rhinitis: Secondary | ICD-10-CM

## 2019-10-03 MED ORDER — CETIRIZINE HCL 10 MG PO TABS: ORAL_TABLET | ORAL | 11 refills | Status: DC

## 2019-10-03 NOTE — Telephone Encounter (Signed)
Refill sent.

## 2019-10-03 NOTE — Telephone Encounter (Signed)
Pt came in office stating is needing refill on cetirizine (ZYRTEC) 10 MG tablet ASAP sent to [place] [place] [facility] [address] [facility] [address]. Please advise ASAP since pt stated had requested refill since last wk with pharmacy and still did not get refill. Please advise

## 2019-12-07 ENCOUNTER — Other Ambulatory Visit: Payer: Self-pay | Admitting: Family Medicine

## 2019-12-07 DIAGNOSIS — E1165 Type 2 diabetes mellitus with hyperglycemia: Secondary | ICD-10-CM

## 2019-12-07 MED ORDER — SITAGLIPTIN PHOSPHATE 100 MG PO TABS: 100.0000 mg | ORAL_TABLET | Freq: Every day | ORAL | 0 refills | Status: DC

## 2019-12-07 NOTE — Telephone Encounter (Signed)
Medication: sitaGLIPtin (JANUVIA) 100 MG tablet   Has the patient contacted their pharmacy? Yes.   (If no, request that the patient contact the pharmacy for the refill.) (If yes, when and what did the pharmacy advise?)  Preferred Pharmacy (with phone number or street name):  [facility] [place] [facility] [place] [address]  [place], [place] [address]  Phone:  [phone] Fax:  [phone]: Please be advised that RX refills may take up to 3 business days. We ask that you follow-up with your pharmacy.

## 2019-12-07 NOTE — Telephone Encounter (Signed)
Rx sent 

## 2020-01-05 ENCOUNTER — Ambulatory Visit: Payer: Federal, State, Local not specified - PPO | Admitting: Family Medicine

## 2020-02-01 ENCOUNTER — Encounter: Payer: Self-pay | Admitting: Family Medicine

## 2020-02-01 ENCOUNTER — Ambulatory Visit: Payer: Federal, State, Local not specified - PPO | Admitting: Family Medicine

## 2020-02-01 ENCOUNTER — Other Ambulatory Visit: Payer: Self-pay

## 2020-02-01 VITALS — BP 120/78 | HR 80 | Temp 98.3°F | Resp 18 | Ht 61.0 in | Wt 239.0 lb

## 2020-02-01 DIAGNOSIS — E1165 Type 2 diabetes mellitus with hyperglycemia: Secondary | ICD-10-CM

## 2020-02-01 DIAGNOSIS — E1169 Type 2 diabetes mellitus with other specified complication: Secondary | ICD-10-CM | POA: Diagnosis not present

## 2020-02-01 DIAGNOSIS — E785 Hyperlipidemia, unspecified: Secondary | ICD-10-CM | POA: Diagnosis not present

## 2020-02-01 DIAGNOSIS — I10 Essential (primary) hypertension: Secondary | ICD-10-CM | POA: Diagnosis not present

## 2020-02-01 NOTE — Progress Notes (Signed)
Patient ID: Ebony Curtis, female    DOB: June 13, 1960  Age: 59 y.o. MRN: [id number]    Subjective:  Subjective  HPI Ebony Curtis presents for f/u bp, chol and dm.   No complaints except pain in hands ---she knows she has arthritis   HYPERTENSION   Blood pressure range-not checking   Chest pain- no      Dyspnea- no Lightheadedness- no   Edema- no  Other side effects - no   Medication compliance: good Low salt diet- yes    DIABETES    Blood Sugar ranges-not checking   Polyuria- no New Visual problems- no  Hypoglycemic symptoms- no  Other side effects-no Medication compliance - good Last eye exam- due Foot exam- today    HYPERLIPIDEMIA  Medication compliance- good RUQ pain- no  Muscle aches- no Other side effects-no      Review of Systems  Constitutional: Negative for activity change, appetite change, fatigue and unexpected weight change.  Respiratory: Negative for cough and shortness of breath.   Cardiovascular: Negative for chest pain and palpitations.  Psychiatric/Behavioral: Negative for behavioral problems and dysphoric mood. The patient is not nervous/anxious.     History Past Medical History:  Diagnosis Date  . Chronic lower back pain   . DVT (deep venous thrombosis) ([place]) "years ago"   RUE  . Family history of adverse reaction to anesthesia    "sister's heart went out doing routine OR; she never woke up" (06/01/2016)  . GERD (gastroesophageal reflux disease)   . Heart murmur    "when I was young"  . Hyperlipidemia   . Migraines    "none in years" (06/01/2016)    She has a past surgical history that includes Tonsillectomy (1970s).   Her family history includes Arthritis in her mother; Coronary artery disease in an other family member; Diabetes (age of onset: 57) in her sister; Heart disease in her mother and sister; Heart disease (age of onset: 18) in her father.She reports that she has been smoking cigarettes. She has a 35.00 pack-year smoking  history. She has never used smokeless tobacco. She reports previous alcohol use. She reports that she does not use drugs.  Current Outpatient Medications on File Prior to Visit  Medication Sig Dispense Refill  . Albuterol Sulfate (PROAIR RESPICLICK) 678 (90 BASE) MCG/ACT AEPB Inhale 1 Inhaler into the lungs every 6 (six) hours as needed. 1 each 1  . aspirin 81 MG tablet Take 81 mg by mouth daily.    . cetirizine (ZYRTEC) 10 MG tablet TAKE 1 TABLET(10 MG) BY MOUTH DAILY AS NEEDED FOR ALLERGIES 30 tablet 11  . diclofenac sodium (VOLTAREN) 1 % GEL Apply 2 g topically 4 (four) times daily as needed (for back pain). 100 g 5  . esomeprazole (NEXIUM) 40 MG capsule Take 1 capsule (40 mg total) by mouth daily before breakfast. 90 capsule 3  . fluticasone (FLONASE) 50 MCG/ACT nasal spray Place 2 sprays into both nostrils daily. 16 g 1  . GARLIC PO Take 1 capsule by mouth daily.    Marland Kitchen glucose blood test strip Use as instructed 100 each 12  . ibuprofen (ADVIL,MOTRIN) 200 MG tablet Take 400 mg by mouth every 6 (six) hours as needed (for back pain).    . Multiple Vitamins-Minerals (CENTRUM SILVER 50+WOMEN) TABS Take 1 tablet by mouth daily.    Ebony Curtis DELICA LANCETS FINE MISC Use as directed once a day.  DX: E11.9 100 each 2  . rosuvastatin (CRESTOR) 20 MG tablet TAKE  1 TABLET(20 MG) BY MOUTH DAILY 90 tablet 1  . sitaGLIPtin (JANUVIA) 100 MG tablet Take 1 tablet (100 mg total) by mouth daily. 90 tablet 0  . vitamin E (VITAMIN E) 400 UNIT capsule Take 400 Units by mouth daily.     No current facility-administered medications on file prior to visit.     Objective:  Objective  Physical Exam Vitals and nursing note reviewed.  Constitutional:      Appearance: She is well-developed.  HENT:     Head: Normocephalic and atraumatic.  Eyes:     Conjunctiva/sclera: Conjunctivae normal.  Neck:     Thyroid: No thyromegaly.     Vascular: No carotid bruit or JVD.  Cardiovascular:     Rate and Rhythm: Normal  rate and regular rhythm.     Heart sounds: Normal heart sounds. No murmur heard.   Pulmonary:     Effort: Pulmonary effort is normal. No respiratory distress.     Breath sounds: Normal breath sounds. No wheezing or rales.  Chest:     Chest wall: No tenderness.  Musculoskeletal:     Cervical back: Normal range of motion and neck supple.  Neurological:     Mental Status: She is alert and oriented to person, place, and time.    Diabetic Foot Exam - Simple   Simple Foot Form Diabetic Foot exam was performed with the following findings: Yes 02/01/2020 11:01 AM  Visual Inspection No deformities, no ulcerations, no other skin breakdown bilaterally: Yes Sensation Testing Intact to touch and monofilament testing bilaterally: Yes Pulse Check Posterior Tibialis and Dorsalis pulse intact bilaterally: Yes Comments     BP 120/78 (BP Location: Right Arm, Patient Position: Sitting, Cuff Size: Large)   Pulse 80   Temp 98.3 F (36.8 C) (Oral)   Resp 18   Ht 5\' 1"  (1.549 m)   Wt 239 lb (108.4 kg)   SpO2 96%   BMI 45.16 kg/m  Wt Readings from Last 3 Encounters:  02/01/20 239 lb (108.4 kg)  06/30/19 242 lb 3.2 oz (109.9 kg)  11/01/18 250 lb 9.6 oz (113.7 kg)     Lab Results  Component Value Date   WBC 9.0 06/30/2019   HGB 14.6 06/30/2019   HCT 43.4 06/30/2019   PLT 271.0 06/30/2019   GLUCOSE 96 02/01/2020   CHOL 149 02/01/2020   TRIG 82 02/01/2020   HDL 44 (L) 02/01/2020   LDLDIRECT 153.1 03/15/2012   LDLCALC 88 02/01/2020   ALT 12 02/01/2020   AST 13 02/01/2020   NA 139 02/01/2020   K 4.9 02/01/2020   CL 105 02/01/2020   CREATININE 0.86 02/01/2020   BUN 12 02/01/2020   CO2 26 02/01/2020   TSH 1.28 03/07/2018   HGBA1C 6.4 (H) 02/01/2020   MICROALBUR 1.8 06/30/2019    DG Bone Density  Result Date: 09/10/2016 EXAM: DUAL X-RAY ABSORPTIOMETRY (DXA) FOR BONE MINERAL DENSITY IMPRESSION: Referring Physician:  Rosalita Curtis Ebony Curtis PATIENT: Name: Ebony, Curtis Patient ID:  [id number] Birth Date: 1961-01-04 Height: 62.5 in. Sex: Female Measured: 09/10/2016 Weight: 241.3 lbs. Indications: African-American, Diabetic, Estrogen Deficiency, Post Menopausal, Tobacco User(Current) Fractures: Treatments: Januvia, Multivitamin, Vitamin D ASSESSMENT: The BMD measured at Femur Neck Left is 1.120 g/cm2 with a T-score of 0.6. This patient is considered normal according to [place] [facility]) criteria. Site Region Measured Date Measured Age WHO YA BMD Classification T-score AP Spine L1-L4 09[age]10/2016 56.4 Normal 1.4 1.352 g/cm2 DualFemur Neck Left 8/10/2016 56.3 years Normal 0.6 1.120 g/cm2  World Pharmacologist [facility]) criteria for post-menopausal, Caucasian Women: Normal       T-score at or above -1 SD Osteopenia   T-score between -1 and -2.5 SD Osteoporosis T-score at or below -2.5 SD RECOMMENDATION: [place] recommends that FDA-approved medical therapies be considered in postmenopausal women and men age 53 or older with a: 1. Hip or vertebral (clinical or morphometric) fracture. 2. T-score of < -2.5 at the spine or hip. 3. Ten-year fracture probability by FRAX of 3% or greater for hip fracture or 20% or greater for major osteoporotic fracture. All treatment decisions require clinical judgment and consideration of individual patient factors, including patient preferences, co-morbidities, previous drug use, risk factors not captured in the FRAX model (e.g. falls, vitamin D deficiency, increased bone turnover, interval significant decline in bone density) and possible under - or over-estimation of fracture risk by FRAX. All patients should ensure an adequate intake of dietary calcium (1200 mg/d) and vitamin D (800 IU daily) unless contraindicated. FOLLOW-UP: People with diagnosed cases of osteoporosis or at high risk for fracture should have regular bone mineral density tests. For patients eligible for Medicare, routine testing is allowed once every 2 years. The  testing frequency can be increased to one year for patients who have rapidly progressing disease, those who are receiving or discontinuing medical therapy to restore bone mass, or have additional risk factors. I have reviewed this report and agree with the above findings. [facility] Radiology Electronically Signed   By: Lowella Grip III M.D.   On: 09/10/2016 11:02   MM SCREENING BREAST TOMO BILATERAL  Result Date: 09/11/2016 CLINICAL DATA:  Screening. EXAM: 2D DIGITAL SCREENING BILATERAL MAMMOGRAM WITH CAD AND ADJUNCT TOMO COMPARISON:  Previous exam(s). ACR Breast Density Category a: The breast tissue is almost entirely fatty. FINDINGS: There are no findings suspicious for malignancy. Images were processed with CAD. IMPRESSION: No mammographic evidence of malignancy. A result letter of this screening mammogram will be mailed directly to the patient. RECOMMENDATION: Screening mammogram in one year. (Code:SM-B-01Y) BI-RADS CATEGORY  1: Negative. Electronically Signed   By: Trude Mcburney M.D.   On: 09/11/2016 10:00     Assessment & Plan:  Plan  I am having Gala Murdoch maintain her esomeprazole, aspirin, Albuterol Sulfate, ibuprofen, Centrum Silver 86+PYPPJ, GARLIC PO, vitamin E, fluticasone, glucose blood, OneTouch Delica Lancets Fine, diclofenac sodium, rosuvastatin, cetirizine, and sitaGLIPtin.  No orders of the defined types were placed in this encounter.   Problem List Items Addressed This Visit      Unprioritized   Controlled type 2 diabetes mellitus with hyperglycemia, without long-term current use of insulin ([place])    hgba1c to be checked , minimize simple carbs. Increase exercise as tolerated. Continue current meds       Essential hypertension    Well controlled, . Encouraged heart healthy diet such as the DASH diet and exercise as tolerated. On no meds     bp no longer elevated        Hyperlipidemia LDL goal <70    Encouraged heart healthy diet, increase exercise, avoid  trans fats, consider a krill oil cap daily con't crestor        Other Visit Diagnoses    Uncontrolled type 2 diabetes mellitus with hyperglycemia ([place])    -  Primary   Relevant Orders   Hemoglobin A1c (Completed)   Lipid panel (Completed)   Comprehensive metabolic panel (Completed)   Primary hypertension       Relevant Orders  Hemoglobin A1c (Completed)   Lipid panel (Completed)   Comprehensive metabolic panel (Completed)   Hyperlipidemia associated with type 2 diabetes mellitus ([place])       Relevant Orders   Hemoglobin A1c (Completed)   Lipid panel (Completed)   Comprehensive metabolic panel (Completed)      Follow-up: Return in about 6 months (around 08/01/2020), or if symptoms worsen or fail to improve, for annual exam, fasting.  Ann Held, DO

## 2020-02-01 NOTE — Patient Instructions (Signed)
Carbohydrate Counting for Diabetes Mellitus, Adult  Carbohydrate counting is a method of keeping track of how many carbohydrates you eat. Eating carbohydrates naturally increases the amount of sugar (glucose) in the blood. Counting how many carbohydrates you eat helps keep your blood glucose within normal limits, which helps you manage your diabetes (diabetes mellitus). It is important to know how many carbohydrates you can safely have in each meal. This is different for every person. A diet and nutrition specialist (registered dietitian) can help you make a meal plan and calculate how many carbohydrates you should have at each meal and snack. Carbohydrates are found in the following foods:  Grains, such as breads and cereals.  Dried beans and soy products.  Starchy vegetables, such as potatoes, peas, and corn.  Fruit and fruit juices.  Milk and yogurt.  Sweets and snack foods, such as cake, cookies, candy, chips, and soft drinks. How do I count carbohydrates? There are two ways to count carbohydrates in food. You can use either of the methods or a combination of both. Reading "Nutrition Facts" on packaged food The "Nutrition Facts" list is included on the labels of almost all packaged foods and beverages in the [place] It includes:  The serving size.  Information about nutrients in each serving, including the grams (g) of carbohydrate per serving. To use the "Nutrition Facts":  Decide how many servings you will have.  Multiply the number of servings by the number of carbohydrates per serving.  The resulting number is the total amount of carbohydrates that you will be having. Learning standard serving sizes of other foods When you eat carbohydrate foods that are not packaged or do not include "Nutrition Facts" on the label, you need to measure the servings in order to count the amount of carbohydrates:  Measure the foods that you will eat with a food scale or measuring cup, if  needed.  Decide how many standard-size servings you will eat.  Multiply the number of servings by 15. Most carbohydrate-rich foods have about 15 g of carbohydrates per serving. ? For example, if you eat 8 oz (170 g) of strawberries, you will have eaten 2 servings and 30 g of carbohydrates (2 servings x 15 g = 30 g).  For foods that have more than one food mixed, such as soups and casseroles, you must count the carbohydrates in each food that is included. The following list contains standard serving sizes of common carbohydrate-rich foods. Each of these servings has about 15 g of carbohydrates:   hamburger bun or  English muffin.   oz (15 mL) syrup.   oz (14 g) jelly.  1 slice of bread.  1 six-inch tortilla.  3 oz (85 g) cooked rice or pasta.  4 oz (113 g) cooked dried beans.  4 oz (113 g) starchy vegetable, such as peas, corn, or potatoes.  4 oz (113 g) hot cereal.  4 oz (113 g) mashed potatoes or  of a large baked potato.  4 oz (113 g) canned or frozen fruit.  4 oz (120 mL) fruit juice.  4-6 crackers.  6 chicken nuggets.  6 oz (170 g) unsweetened dry cereal.  6 oz (170 g) plain fat-free yogurt or yogurt sweetened with artificial sweeteners.  8 oz (240 mL) milk.  8 oz (170 g) fresh fruit or one small piece of fruit.  24 oz (680 g) popped popcorn. Example of carbohydrate counting Sample meal  3 oz (85 g) chicken breast.  6 oz (170 g)   brown rice.  4 oz (113 g) corn.  8 oz (240 mL) milk.  8 oz (170 g) strawberries with sugar-free whipped topping. Carbohydrate calculation 1. Identify the foods that contain carbohydrates: ? Rice. ? Corn. ? Milk. ? Strawberries. 2. Calculate how many servings you have of each food: ? 2 servings rice. ? 1 serving corn. ? 1 serving milk. ? 1 serving strawberries. 3. Multiply each number of servings by 15 g: ? 2 servings rice x 15 g = 30 g. ? 1 serving corn x 15 g = 15 g. ? 1 serving milk x 15 g = 15 g. ? 1  serving strawberries x 15 g = 15 g. 4. Add together all of the amounts to find the total grams of carbohydrates eaten: ? 30 g + 15 g + 15 g + 15 g = 75 g of carbohydrates total. Summary  Carbohydrate counting is a method of keeping track of how many carbohydrates you eat.  Eating carbohydrates naturally increases the amount of sugar (glucose) in the blood.  Counting how many carbohydrates you eat helps keep your blood glucose within normal limits, which helps you manage your diabetes.  A diet and nutrition specialist (registered dietitian) can help you make a meal plan and calculate how many carbohydrates you should have at each meal and snack. This information is not intended to replace advice given to you by your health care provider. Make sure you discuss any questions you have with your health care provider. Document Revised: 10/15/2016 Document Reviewed: 09/04/2015 Elsevier Patient Education  2020 Elsevier Inc.  

## 2020-02-02 LAB — COMPREHENSIVE METABOLIC PANEL
AG Ratio: 1.4 (calc) (ref 1.0–2.5)
ALT: 12 U/L (ref 6–29)
AST: 13 U/L (ref 10–35)
Albumin: 4.1 g/dL (ref 3.6–5.1)
Alkaline phosphatase (APISO): 51 U/L (ref 37–153)
BUN: 12 mg/dL (ref 7–25)
CO2: 26 mmol/L (ref 20–32)
Calcium: 9.3 mg/dL (ref 8.6–10.4)
Chloride: 105 mmol/L (ref 98–110)
Creat: 0.86 mg/dL (ref 0.50–1.05)
Globulin: 3 g/dL (calc) (ref 1.9–3.7)
Glucose, Bld: 96 mg/dL (ref 65–99)
Potassium: 4.9 mmol/L (ref 3.5–5.3)
Sodium: 139 mmol/L (ref 135–146)
Total Bilirubin: 0.4 mg/dL (ref 0.2–1.2)
Total Protein: 7.1 g/dL (ref 6.1–8.1)

## 2020-02-02 LAB — HEMOGLOBIN A1C
Hgb A1c MFr Bld: 6.4 % of total Hgb — ABNORMAL HIGH (ref <5.7)
Mean Plasma Glucose: 137 (calc)
eAG (mmol/L): 7.6 (calc)

## 2020-02-02 LAB — LIPID PANEL
Cholesterol: 149 mg/dL (ref <200)
HDL: 44 mg/dL — ABNORMAL LOW (ref >=50)
LDL Cholesterol (Calc): 88 mg/dL (calc)
Non-HDL Cholesterol (Calc): 105 mg/dL (calc) (ref <130)
Total CHOL/HDL Ratio: 3.4 (calc) (ref <5.0)
Triglycerides: 82 mg/dL (ref <150)

## 2020-02-04 NOTE — Assessment & Plan Note (Signed)
hgba1c to be checked, minimize simple carbs. Increase exercise as tolerated. Continue current meds  

## 2020-02-04 NOTE — Assessment & Plan Note (Signed)
Well controlled, . Encouraged heart healthy diet such as the DASH diet and exercise as tolerated. On no meds     bp no longer elevated

## 2020-02-04 NOTE — Assessment & Plan Note (Addendum)
Encouraged heart healthy diet, increase exercise, avoid trans fats, consider a krill oil cap daily con't crestor

## 2020-02-06 ENCOUNTER — Encounter: Payer: Self-pay | Admitting: *Deleted

## 2020-03-04 ENCOUNTER — Other Ambulatory Visit: Payer: Self-pay | Admitting: Family Medicine

## 2020-03-04 DIAGNOSIS — E1165 Type 2 diabetes mellitus with hyperglycemia: Secondary | ICD-10-CM

## 2020-05-20 ENCOUNTER — Encounter: Payer: Self-pay | Admitting: Family Medicine

## 2020-05-20 ENCOUNTER — Other Ambulatory Visit: Payer: Self-pay

## 2020-05-20 ENCOUNTER — Telehealth (INDEPENDENT_AMBULATORY_CARE_PROVIDER_SITE_OTHER): Payer: Federal, State, Local not specified - PPO | Admitting: Family Medicine

## 2020-05-20 VITALS — Ht 61.0 in

## 2020-05-20 DIAGNOSIS — J0140 Acute pansinusitis, unspecified: Secondary | ICD-10-CM

## 2020-05-20 DIAGNOSIS — J209 Acute bronchitis, unspecified: Secondary | ICD-10-CM

## 2020-05-20 MED ORDER — FLUTICASONE PROPIONATE 50 MCG/ACT NA SUSP: 2.0000 | Freq: Every day | NASAL | 1 refills | Status: DC

## 2020-05-20 MED ORDER — AMOXICILLIN-POT CLAVULANATE 875-125 MG PO TABS: 1.0000 | ORAL_TABLET | Freq: Two times a day (BID) | ORAL | 0 refills | Status: DC

## 2020-05-20 MED ORDER — AMOXICILLIN-POT CLAVULANATE 600-42.9 MG/5ML PO SUSR: ORAL | 0 refills | Status: DC

## 2020-05-20 NOTE — Progress Notes (Signed)
Virtual Visit via Video Note  I connected with Ebony Curtis on 05/20/20 at  1:20 PM EST by a video enabled telemedicine application and verified that I am speaking with the correct person using two identifiers.  Location/ participants in ov  Patient: home alone  Provider: office   I discussed the limitations of evaluation and management by telemedicine and the availability of in person appointments. The patient expressed understanding and agreed to proceed.  History of Present Illness: Pt is home c/o cough and congestion x 3 weeks.  She was taking coricidin hbp otc with some help .  + sinus pressure and sore throat   + pnd   Pt is taking zyrtec    Observations/Objective: There were no vitals filed for this visit. Not fevers   Assessment and Plan: 1. Acute bronchitis, unspecified organism otc cough med ok  - fluticasone (FLONASE) 50 MCG/ACT nasal spray; Place 2 sprays into both nostrils daily.  Dispense: 16 g; Refill: 1  2. Acute non-recurrent pansinusitis flonase and abx  Pt will get a covid test  F/u in office prn  - amoxicillin-clavulanate (AUGMENTIN) 875-125 MG tablet; Take 1 tablet by mouth 2 (two) times daily.  Dispense: 20 tablet; Refill: 0   Follow Up Instructions:    I discussed the assessment and treatment plan with the patient. The patient was provided an opportunity to ask questions and all were answered. The patient agreed with the plan and demonstrated an understanding of the instructions.   The patient was advised to call back or seek an in-person evaluation if the symptoms worsen or if the condition fails to improve as anticipated.    Ann Held, DO

## 2020-05-22 ENCOUNTER — Other Ambulatory Visit: Payer: Self-pay

## 2020-05-22 DIAGNOSIS — J209 Acute bronchitis, unspecified: Secondary | ICD-10-CM

## 2020-05-22 MED ORDER — FLUTICASONE PROPIONATE 50 MCG/ACT NA SUSP: 2.0000 | Freq: Every day | NASAL | 1 refills | Status: AC

## 2020-05-28 ENCOUNTER — Telehealth: Payer: Self-pay | Admitting: Family Medicine

## 2020-05-28 NOTE — Telephone Encounter (Signed)
Pt will need a follow up virtual appt for refill on a antibiotic

## 2020-05-28 NOTE — Telephone Encounter (Signed)
Medication: amoxicillin-clavulanate (AUGMENTIN ES-600) 600-42.9 MG/5ML suspension    Has the patient contacted their pharmacy? No. (If no, request that the patient contact the pharmacy for the refill.) (If yes, when and what did the pharmacy advise?)  Preferred Pharmacy (with phone number or street name):  [facility] [place], [address]  [place], [place] [address]  Phone:  [phone] Fax:  [phone]  DEA #:  [id number] Agent: Please be advised that RX refills may take up to 3 business days. We ask that you follow-up with your pharmacy.

## 2020-05-29 ENCOUNTER — Telehealth (INDEPENDENT_AMBULATORY_CARE_PROVIDER_SITE_OTHER): Payer: Federal, State, Local not specified - PPO | Admitting: Family Medicine

## 2020-05-29 ENCOUNTER — Encounter: Payer: Self-pay | Admitting: Family Medicine

## 2020-05-29 DIAGNOSIS — J0140 Acute pansinusitis, unspecified: Secondary | ICD-10-CM | POA: Diagnosis not present

## 2020-05-29 MED ORDER — CEFDINIR 250 MG/5ML PO SUSR: 300.0000 mg | Freq: Two times a day (BID) | ORAL | 0 refills | Status: DC

## 2020-05-29 NOTE — Progress Notes (Signed)
Virtual Visit via Video Note  I connected with Ebony Curtis on 05/29/20 at  9:20 AM EST by a video enabled telemedicine application and verified that I am speaking with the correct person using two identifiers.  Location/participants in ov  Patient: home alone Provider: home    I discussed the limitations of evaluation and management by telemedicine and the availability of in person appointments. The patient expressed understanding and agreed to proceed.  History of Present Illness: Pt still has sinus symptoms -- the pharmacy only gave her 9 days of augmentin     She still has sinus pressure/ congestion  Observations/Objective: There were no vitals filed for this visit. Pt is in nad  No sob   Assessment and Plan: 1. Acute non-recurrent pansinusitis abx per orders con't flonase covid test was neg-- in no better pt needs in person ov  - cefdinir (OMNICEF) 250 MG/5ML suspension; Take 6 mLs (300 mg total) by mouth 2 (two) times daily.  Dispense: 100 mL; Refill: 0  Follow Up Instructions:    I discussed the assessment and treatment plan with the patient. The patient was provided an opportunity to ask questions and all were answered. The patient agreed with the plan and demonstrated an understanding of the instructions.   The patient was advised to call back or seek an in-person evaluation if the symptoms worsen or if the condition fails to improve as anticipated.    Ann Held, DO

## 2020-06-03 ENCOUNTER — Other Ambulatory Visit: Payer: Self-pay | Admitting: Family Medicine

## 2020-06-03 DIAGNOSIS — E1165 Type 2 diabetes mellitus with hyperglycemia: Secondary | ICD-10-CM

## 2020-06-20 ENCOUNTER — Encounter: Payer: Self-pay | Admitting: Family Medicine

## 2020-06-20 ENCOUNTER — Ambulatory Visit (HOSPITAL_BASED_OUTPATIENT_CLINIC_OR_DEPARTMENT_OTHER)
Admission: RE | Admit: 2020-06-20 | Discharge: 2020-06-20 | Disposition: A | Discharge status: Home / Self Care | Payer: Federal, State, Local not specified - PPO | Source: Ambulatory Visit | Attending: Family Medicine | Admitting: Family Medicine

## 2020-06-20 ENCOUNTER — Other Ambulatory Visit: Payer: Self-pay

## 2020-06-20 ENCOUNTER — Ambulatory Visit: Payer: Federal, State, Local not specified - PPO | Admitting: Family Medicine

## 2020-06-20 VITALS — BP 128/88 | HR 76 | Temp 99.0°F | Resp 18 | Ht 61.0 in | Wt 238.8 lb

## 2020-06-20 DIAGNOSIS — R059 Cough, unspecified: Secondary | ICD-10-CM

## 2020-06-20 DIAGNOSIS — J40 Bronchitis, not specified as acute or chronic: Secondary | ICD-10-CM | POA: Diagnosis not present

## 2020-06-20 DIAGNOSIS — J208 Acute bronchitis due to other specified organisms: Secondary | ICD-10-CM

## 2020-06-20 DIAGNOSIS — E1169 Type 2 diabetes mellitus with other specified complication: Secondary | ICD-10-CM | POA: Diagnosis not present

## 2020-06-20 DIAGNOSIS — E1165 Type 2 diabetes mellitus with hyperglycemia: Secondary | ICD-10-CM | POA: Diagnosis not present

## 2020-06-20 DIAGNOSIS — M549 Dorsalgia, unspecified: Secondary | ICD-10-CM

## 2020-06-20 DIAGNOSIS — E785 Hyperlipidemia, unspecified: Secondary | ICD-10-CM | POA: Diagnosis not present

## 2020-06-20 DIAGNOSIS — G8929 Other chronic pain: Secondary | ICD-10-CM

## 2020-06-20 LAB — LIPID PANEL
Cholesterol: 166 mg/dL (ref 0–200)
HDL: 49.2 mg/dL (ref >39.00)
LDL Cholesterol: 101 mg/dL — ABNORMAL HIGH (ref 0–99)
NonHDL: 117.14
Total CHOL/HDL Ratio: 3
Triglycerides: 81 mg/dL (ref 0.0–149.0)
VLDL: 16.2 mg/dL (ref 0.0–40.0)

## 2020-06-20 LAB — COMPREHENSIVE METABOLIC PANEL
ALT: 14 U/L (ref 0–35)
AST: 13 U/L (ref 0–37)
Albumin: 4.1 g/dL (ref 3.5–5.2)
Alkaline Phosphatase: 53 U/L (ref 39–117)
BUN: 11 mg/dL (ref 6–23)
CO2: 29 mEq/L (ref 19–32)
Calcium: 9.6 mg/dL (ref 8.4–10.5)
Chloride: 103 mEq/L (ref 96–112)
Creatinine, Ser: 0.8 mg/dL (ref 0.40–1.20)
GFR: 80.19 mL/min (ref >60.00)
Glucose, Bld: 102 mg/dL — ABNORMAL HIGH (ref 70–99)
Potassium: 5.1 mEq/L (ref 3.5–5.1)
Sodium: 139 mEq/L (ref 135–145)
Total Bilirubin: 0.3 mg/dL (ref 0.2–1.2)
Total Protein: 7.2 g/dL (ref 6.0–8.3)

## 2020-06-20 LAB — HEMOGLOBIN A1C: Hgb A1c MFr Bld: 6.6 % — ABNORMAL HIGH (ref 4.6–6.5)

## 2020-06-20 MED ORDER — PROMETHAZINE-DM 6.25-15 MG/5ML PO SYRP: 5.0000 mL | ORAL_SOLUTION | Freq: Four times a day (QID) | ORAL | 0 refills | Status: DC | PRN

## 2020-06-20 MED ORDER — SITAGLIPTIN PHOSPHATE 100 MG PO TABS: 100.0000 mg | ORAL_TABLET | Freq: Every day | ORAL | 0 refills | Status: DC

## 2020-06-20 MED ORDER — PROAIR RESPICLICK 108 (90 BASE) MCG/ACT IN AEPB: INHALATION_SPRAY | RESPIRATORY_TRACT | 1 refills | Status: DC

## 2020-06-20 MED ORDER — BUDESONIDE-FORMOTEROL FUMARATE 80-4.5 MCG/ACT IN AERO
2.0000 | INHALATION_SPRAY | Freq: Two times a day (BID) | RESPIRATORY_TRACT | 3 refills | Status: AC
Start: 2020-06-20 — End: ?

## 2020-06-20 MED ORDER — AZITHROMYCIN 250 MG PO TABS: ORAL_TABLET | ORAL | 0 refills | Status: DC

## 2020-06-20 NOTE — Assessment & Plan Note (Signed)
hgba1c to be checked, minimize simple carbs. Increase exercise as tolerated. Continue current meds  

## 2020-06-20 NOTE — Progress Notes (Signed)
Patient ID: Ebony Curtis, female    DOB: [birth year]  Age: 60 y.o. MRN: [id number]    Subjective:  Subjective  HPI Ebony Curtis presents for f/u diabetes and c/o con't cough and congestion since the last ov.   No fevers  She is not checking her bs  No other compliants   Review of Systems  Constitutional: Negative for appetite change, diaphoresis, fatigue and unexpected weight change.  Eyes: Negative for pain, redness and visual disturbance.  Respiratory: Negative for cough, chest tightness, shortness of breath and wheezing.   Cardiovascular: Negative for chest pain, palpitations and leg swelling.  Endocrine: Negative for cold intolerance, heat intolerance, polydipsia, polyphagia and polyuria.  Genitourinary: Negative for difficulty urinating, dysuria and frequency.  Neurological: Negative for dizziness, light-headedness, numbness and headaches.    History Past Medical History:  Diagnosis Date  . Chronic lower back pain   . DVT (deep venous thrombosis) ([place]) "years ago"   RUE  . Family history of adverse reaction to anesthesia    "sister's heart went out doing routine OR; she never woke up" (06/01/2016)  . GERD (gastroesophageal reflux disease)   . Heart murmur    "when I was young"  . Hyperlipidemia   . Migraines    "none in years" (06/01/2016)    She has a past surgical history that includes Tonsillectomy (1970s).   Her family history includes Arthritis in her mother; Coronary artery disease in an other family member; Diabetes (age of onset: 1) in her sister; Heart disease in her mother and sister; Heart disease (age of onset: 23) in her father.She reports that she has been smoking cigarettes. She has a 35.00 pack-year smoking history. She has never used smokeless tobacco. She reports previous alcohol use. She reports that she does not use drugs.  Current Outpatient Medications on File Prior to Visit  Medication Sig Dispense Refill  . aspirin 81 MG tablet Take 81 mg  by mouth daily.    . cetirizine (ZYRTEC) 10 MG tablet TAKE 1 TABLET(10 MG) BY MOUTH DAILY AS NEEDED FOR ALLERGIES 30 tablet 11  . diclofenac sodium (VOLTAREN) 1 % GEL Apply 2 g topically 4 (four) times daily as needed (for back pain). 100 g 5  . fluticasone (FLONASE) 50 MCG/ACT nasal spray Place 2 sprays into both nostrils daily. 48 g 1  . GARLIC PO Take 1 capsule by mouth daily.    Marland Kitchen glucose blood test strip Use as instructed 100 each 12  . ibuprofen (ADVIL,MOTRIN) 200 MG tablet Take 400 mg by mouth every 6 (six) hours as needed (for back pain).    . Multiple Vitamins-Minerals (CENTRUM SILVER 50+WOMEN) TABS Take 1 tablet by mouth daily.    Glory Rosebush DELICA LANCETS FINE MISC Use as directed once a day.  DX: E11.9 100 each 2  . rosuvastatin (CRESTOR) 20 MG tablet TAKE 1 TABLET(20 MG) BY MOUTH DAILY 90 tablet 1  . vitamin E 180 MG (400 UNITS) capsule Take 400 Units by mouth daily.     No current facility-administered medications on file prior to visit.     Objective:  Objective  Physical Exam Vitals and nursing note reviewed.  Constitutional:      Appearance: She is well-developed.  HENT:     Head: Normocephalic and atraumatic.  Eyes:     Conjunctiva/sclera: Conjunctivae normal.  Neck:     Thyroid: No thyromegaly.     Vascular: No carotid bruit or JVD.  Cardiovascular:     Rate and Rhythm: Normal  rate and regular rhythm.     Heart sounds: Normal heart sounds. No murmur heard.   Pulmonary:     Effort: Pulmonary effort is normal. No respiratory distress.     Breath sounds: Normal breath sounds. No wheezing or rales.  Chest:     Chest wall: No tenderness.  Musculoskeletal:     Cervical back: Normal range of motion and neck supple.  Neurological:     Mental Status: She is alert and oriented to person, place, and time.    BP 128/88 (BP Location: Right Arm, Patient Position: Sitting, Cuff Size: Large)   Pulse 76   Temp 99 F (37.2 C) (Oral)   Resp 18   Ht 5\' 1"  (1.549 m)    Wt 238 lb 12.8 oz (108.3 kg)   SpO2 96%   BMI 45.12 kg/m  Wt Readings from Last 3 Encounters:  06/20/20 238 lb 12.8 oz (108.3 kg)  02/01/20 239 lb (108.4 kg)  06/30/19 242 lb 3.2 oz (109.9 kg)     Lab Results  Component Value Date   WBC 9.0 06/30/2019   HGB 14.6 06/30/2019   HCT 43.4 06/30/2019   PLT 271.0 06/30/2019   GLUCOSE 96 02/01/2020   CHOL 149 02/01/2020   TRIG 82 02/01/2020   HDL 44 (L) 02/01/2020   LDLDIRECT 153.1 03/15/2012   LDLCALC 88 02/01/2020   ALT 12 02/01/2020   AST 13 02/01/2020   NA 139 02/01/2020   K 4.9 02/01/2020   CL 105 02/01/2020   CREATININE 0.86 02/01/2020   BUN 12 02/01/2020   CO2 26 02/01/2020   TSH 1.28 03/07/2018   HGBA1C 6.4 (H) 02/01/2020   MICROALBUR 1.8 06/30/2019    DG Bone Density  Result Date: 09/10/2016 EXAM: DUAL X-RAY ABSORPTIOMETRY (DXA) FOR BONE MINERAL DENSITY IMPRESSION: Referring Physician:  Rosalita Chessman CHASE PATIENT: Name: Ebony, Curtis Patient ID: [id number] Birth Date: 1960-05-24 Height: 62.5 in. Sex: Female Measured: 09/10/2016 Weight: 241.3 lbs. Indications: African-American, Diabetic, Estrogen Deficiency, Post Menopausal, Tobacco User(Current) Fractures: Treatments: Januvia, Multivitamin, Vitamin D ASSESSMENT: The BMD measured at Femur Neck Left is 1.120 g/cm2 with a T-score of 0.6. This patient is considered normal according to [facility]) criteria. Site Region Measured Date Measured Age WHO YA BMD Classification T-score AP Spine L1-L4 09[age]10[age] 56.4 Normal 1.4 1.352 g/cm2 DualFemur Neck Left 8/10[age] 56.3 years Normal 0.6 1.120 g/cm2 World Health Organization [facility]) criteria for post-menopausal, Caucasian Women: Normal       T-score at or above -1 SD Osteopenia   T-score between -1 and -2.5 SD Osteoporosis T-score at or below -2.5 SD RECOMMENDATION: [place] recommends that FDA-approved medical therapies be considered in postmenopausal women and men age 65 or older with a: 1. Hip  or vertebral (clinical or morphometric) fracture. 2. T-score of < -2.5 at the spine or hip. 3. Ten-year fracture probability by FRAX of 3% or greater for hip fracture or 20% or greater for major osteoporotic fracture. All treatment decisions require clinical judgment and consideration of individual patient factors, including patient preferences, co-morbidities, previous drug use, risk factors not captured in the FRAX model (e.g. falls, vitamin D deficiency, increased bone turnover, interval significant decline in bone density) and possible under - or over-estimation of fracture risk by FRAX. All patients should ensure an adequate intake of dietary calcium (1200 mg/d) and vitamin D (800 IU daily) unless contraindicated. FOLLOW-UP: People with diagnosed cases of osteoporosis or at high risk for fracture should have regular bone mineral  density tests. For patients eligible for Medicare, routine testing is allowed once every 2 years. The testing frequency can be increased to one year for patients who have rapidly progressing disease, those who are receiving or discontinuing medical therapy to restore bone mass, or have additional risk factors. I have reviewed this report and agree with the above findings. [facility] Electronically Signed   By: Lowella Grip III M.D.   On: 09/10/2016 11:02   MM SCREENING BREAST TOMO BILATERAL  Result Date: 09/11/2016 CLINICAL DATA:  Screening. EXAM: 2D DIGITAL SCREENING BILATERAL MAMMOGRAM WITH CAD AND ADJUNCT TOMO COMPARISON:  Previous exam(s). ACR Breast Density Category a: The breast tissue is almost entirely fatty. FINDINGS: There are no findings suspicious for malignancy. Images were processed with CAD. IMPRESSION: No mammographic evidence of malignancy. A result letter of this screening mammogram will be mailed directly to the patient. RECOMMENDATION: Screening mammogram in one year. (Code:SM-B-01Y) BI-RADS CATEGORY  1: Negative. Electronically Signed   By:  Trude Mcburney M.D.   On: 09/11/2016 10:00     Assessment & Plan:  Plan  I have discontinued Jane Niehoff's esomeprazole, Albuterol Sulfate, amoxicillin-clavulanate, and cefdinir. I am also having her start on azithromycin, budesonide-formoterol, and promethazine-dextromethorphan. Additionally, I am having her maintain her aspirin, ibuprofen, Centrum Silver 66+AYTKZ, GARLIC PO, vitamin E, glucose blood, OneTouch Delica Lancets Fine, diclofenac sodium, rosuvastatin, cetirizine, fluticasone, sitaGLIPtin, and ProAir RespiClick.  Meds ordered this encounter  Medications  . sitaGLIPtin (JANUVIA) 100 MG tablet    Sig: Take 1 tablet (100 mg total) by mouth daily. Due for appt    Dispense:  30 tablet    Refill:  0    Requested drug refills are authorized, however, the patient needs further evaluation and/or laboratory testing before further refills are given. Ask her to make an appointment for this.  Marland Kitchen azithromycin (ZITHROMAX Z-PAK) 250 MG tablet    Sig: As directed    Dispense:  6 each    Refill:  0  . budesonide-formoterol (SYMBICORT) 80-4.5 MCG/ACT inhaler    Sig: Inhale 2 puffs into the lungs 2 (two) times daily.    Dispense:  1 each    Refill:  3  . DISCONTD: Albuterol Sulfate (PROAIR RESPICLICK) 601 (90 Base) MCG/ACT AEPB    Sig: 2 puffs qid prn    Dispense:  1 each    Refill:  1  . promethazine-dextromethorphan (PROMETHAZINE-DM) 6.25-15 MG/5ML syrup    Sig: Take 5 mLs by mouth 4 (four) times daily as needed for cough.    Dispense:  118 mL    Refill:  0  . Albuterol Sulfate (PROAIR RESPICLICK) 093 (90 Base) MCG/ACT AEPB    Sig: 2 puffs qid prn    Dispense:  1 each    Refill:  1    Problem List Items Addressed This Visit      Unprioritized   Acute bronchitis due to other specified organisms   Relevant Medications   azithromycin (ZITHROMAX Z-PAK) 250 MG tablet   Albuterol Sulfate (PROAIR RESPICLICK) 235 (90 Base) MCG/ACT AEPB   Back pain, chronic   Relevant Medications    Albuterol Sulfate (PROAIR RESPICLICK) 573 (90 Base) MCG/ACT AEPB   Bronchitis    con't prn albuterol  Add symbicort z pack and cough med Check cxr F/u prn       Relevant Medications   azithromycin (ZITHROMAX Z-PAK) 250 MG tablet   budesonide-formoterol (SYMBICORT) 80-4.5 MCG/ACT inhaler   Controlled type 2 diabetes mellitus with hyperglycemia, without long-term  current use of insulin ([place]) - Primary    hgba1c to be checked, minimize simple carbs. Increase exercise as tolerated. Continue current meds        Relevant Medications   sitaGLIPtin (JANUVIA) 100 MG tablet   Other Relevant Orders   Hemoglobin A1c   Comprehensive metabolic panel    Other Visit Diagnoses    Hyperlipidemia associated with type 2 diabetes mellitus ([place])       Relevant Medications   sitaGLIPtin (JANUVIA) 100 MG tablet   Other Relevant Orders   Lipid panel   Cough       Relevant Orders   DG Chest 2 View (Completed)      Follow-up: Return in about 6 months (around 12/21/2020), or if symptoms worsen or fail to improve, for annual exam, fasting.  Ann Held, DO

## 2020-06-20 NOTE — Assessment & Plan Note (Signed)
con't prn albuterol  Add symbicort z pack and cough med Check cxr F/u prn

## 2020-06-20 NOTE — Patient Instructions (Signed)

## 2020-08-05 ENCOUNTER — Telehealth: Payer: Self-pay | Admitting: Family Medicine

## 2020-08-05 DIAGNOSIS — E1165 Type 2 diabetes mellitus with hyperglycemia: Secondary | ICD-10-CM

## 2020-08-05 MED ORDER — SITAGLIPTIN PHOSPHATE 100 MG PO TABS: 100.0000 mg | ORAL_TABLET | Freq: Every day | ORAL | 1 refills | Status: DC

## 2020-08-05 NOTE — Telephone Encounter (Signed)
Refill sent.

## 2020-08-05 NOTE — Telephone Encounter (Signed)
Medication: sitaGLIPtin (JANUVIA) 100 MG tablet   Has the patient contacted their pharmacy? No. (If no, request that the patient contact the pharmacy for the refill.) (If yes, when and what did the pharmacy advise?)  Preferred Pharmacy (with phone number or street name):   [facility] [address]  [place], [place] [address]  Phone:  [phone] Fax:  [phone]  DEA #:  [id number]  [place] Reason: --     Agent: Please be advised that RX refills may take up to 3 business days. We ask that you follow-up with your pharmacy.

## 2020-10-01 ENCOUNTER — Other Ambulatory Visit: Payer: Self-pay | Admitting: Family Medicine

## 2020-10-14 ENCOUNTER — Other Ambulatory Visit: Payer: Self-pay | Admitting: Family Medicine

## 2020-10-14 DIAGNOSIS — J302 Other seasonal allergic rhinitis: Secondary | ICD-10-CM

## 2021-01-02 ENCOUNTER — Other Ambulatory Visit (HOSPITAL_BASED_OUTPATIENT_CLINIC_OR_DEPARTMENT_OTHER): Payer: Self-pay

## 2021-01-02 ENCOUNTER — Ambulatory Visit: Payer: Federal, State, Local not specified - PPO | Admitting: Family Medicine

## 2021-01-02 ENCOUNTER — Encounter: Payer: Self-pay | Admitting: Family Medicine

## 2021-01-02 ENCOUNTER — Ambulatory Visit: Payer: Federal, State, Local not specified - PPO | Attending: Internal Medicine

## 2021-01-02 ENCOUNTER — Other Ambulatory Visit: Payer: Self-pay

## 2021-01-02 VITALS — BP 179/72 | HR 71 | Temp 98.9°F | Resp 16 | Ht 61.0 in | Wt 242.0 lb

## 2021-01-02 DIAGNOSIS — E785 Hyperlipidemia, unspecified: Secondary | ICD-10-CM

## 2021-01-02 DIAGNOSIS — I10 Essential (primary) hypertension: Secondary | ICD-10-CM | POA: Diagnosis not present

## 2021-01-02 DIAGNOSIS — E1165 Type 2 diabetes mellitus with hyperglycemia: Secondary | ICD-10-CM | POA: Diagnosis not present

## 2021-01-02 DIAGNOSIS — Z23 Encounter for immunization: Secondary | ICD-10-CM

## 2021-01-02 DIAGNOSIS — E1169 Type 2 diabetes mellitus with other specified complication: Secondary | ICD-10-CM | POA: Diagnosis not present

## 2021-01-02 LAB — CBC WITH DIFFERENTIAL/PLATELET
Basophils Absolute: 0.1 K/uL (ref 0.0–0.1)
Basophils Relative: 1.2 % (ref 0.0–3.0)
Eosinophils Absolute: 0.1 K/uL (ref 0.0–0.7)
Eosinophils Relative: 1.3 % (ref 0.0–5.0)
HCT: 44.2 % (ref 36.0–46.0)
Hemoglobin: 14.5 g/dL (ref 12.0–15.0)
Lymphocytes Relative: 48.4 % — ABNORMAL HIGH (ref 12.0–46.0)
Lymphs Abs: 3.5 K/uL (ref 0.7–4.0)
MCHC: 32.8 g/dL (ref 30.0–36.0)
MCV: 92.3 fl (ref 78.0–100.0)
Monocytes Absolute: 0.5 K/uL (ref 0.1–1.0)
Monocytes Relative: 6.6 % (ref 3.0–12.0)
Neutro Abs: 3.1 K/uL (ref 1.4–7.7)
Neutrophils Relative %: 42.5 % — ABNORMAL LOW (ref 43.0–77.0)
Platelets: 268 K/uL (ref 150.0–400.0)
RBC: 4.79 Mil/uL (ref 3.87–5.11)
RDW: 13.6 % (ref 11.5–15.5)
WBC: 7.3 K/uL (ref 4.0–10.5)

## 2021-01-02 LAB — COMPREHENSIVE METABOLIC PANEL
ALT: 14 U/L (ref 0–35)
AST: 13 U/L (ref 0–37)
Albumin: 4.3 g/dL (ref 3.5–5.2)
Alkaline Phosphatase: 58 U/L (ref 39–117)
BUN: 10 mg/dL (ref 6–23)
CO2: 28 mEq/L (ref 19–32)
Calcium: 9.7 mg/dL (ref 8.4–10.5)
Chloride: 104 mEq/L (ref 96–112)
Creatinine, Ser: 0.92 mg/dL (ref 0.40–1.20)
GFR: 67.56 mL/min (ref >60.00)
Glucose, Bld: 93 mg/dL (ref 70–99)
Potassium: 4.6 mEq/L (ref 3.5–5.1)
Sodium: 140 mEq/L (ref 135–145)
Total Bilirubin: 0.4 mg/dL (ref 0.2–1.2)
Total Protein: 7.2 g/dL (ref 6.0–8.3)

## 2021-01-02 LAB — LIPID PANEL
Cholesterol: 146 mg/dL (ref 0–200)
HDL: 42.8 mg/dL (ref >39.00)
LDL Cholesterol: 88 mg/dL (ref 0–99)
NonHDL: 102.88
Total CHOL/HDL Ratio: 3
Triglycerides: 73 mg/dL (ref 0.0–149.0)
VLDL: 14.6 mg/dL (ref 0.0–40.0)

## 2021-01-02 LAB — HEMOGLOBIN A1C: Hgb A1c MFr Bld: 6.7 % — ABNORMAL HIGH (ref 4.6–6.5)

## 2021-01-02 LAB — MICROALBUMIN / CREATININE URINE RATIO
Creatinine,U: 173.6 mg/dL
Microalb Creat Ratio: 1.2 mg/g (ref 0.0–30.0)
Microalb, Ur: 2.1 mg/dL — ABNORMAL HIGH (ref 0.0–1.9)

## 2021-01-02 MED ORDER — OZEMPIC (0.25 OR 0.5 MG/DOSE) 2 MG/1.5ML ~~LOC~~ SOPN
0.2500 mg | PEN_INJECTOR | SUBCUTANEOUS | 1 refills | Status: DC
  Filled 2021-01-02: qty 1.5, 30d supply, fill #0
  Filled 2021-02-12: qty 1.5, 30d supply, fill #1
  Filled 2021-03-11: qty 4.5, 84d supply, fill #2

## 2021-01-02 MED ORDER — LOSARTAN POTASSIUM 50 MG PO TABS: 50.0000 mg | ORAL_TABLET | Freq: Every day | ORAL | 1 refills | Status: DC

## 2021-01-02 NOTE — Assessment & Plan Note (Signed)
Tolerating statin, encouraged heart healthy diet, avoid trans fats, minimize simple carbs and saturated fats. Increase exercise as tolerated 

## 2021-01-02 NOTE — Assessment & Plan Note (Signed)
hgba1c to be checked, minimize simple carbs. Increase exercise as tolerated. Continue current meds  

## 2021-01-02 NOTE — Patient Instructions (Signed)
Carbohydrate Counting for Diabetes Mellitus, Adult Carbohydrate counting is a method of keeping track of how many carbohydrates you eat. Eating carbohydrates naturally increases the amount of sugar (glucose) in the blood. Counting how many carbohydrates you eat improves your blood glucose control, which helps you manage your diabetes. It is important to know how many carbohydrates you can safely have in each meal. This is different for every person. A dietitian can help you make a meal plan and calculate how many carbohydrates you should have at each meal and snack. What foods contain carbohydrates? Carbohydrates are found in the following foods: Grains, such as breads and cereals. Dried beans and soy products. Starchy vegetables, such as potatoes, peas, and corn. Fruit and fruit juices. Milk and yogurt. Sweets and snack foods, such as cake, cookies, candy, chips, and soft drinks. How do I count carbohydrates in foods? There are two ways to count carbohydrates in food. You can read food labels or learn standard serving sizes of foods. You can use either of the methods or a combination of both. Using the Nutrition Facts label The Nutrition Facts list is included on the labels of almost all packaged foods and beverages in the [place] It includes: The serving size. Information about nutrients in each serving, including the grams (g) of carbohydrate per serving. To use the Nutrition Facts: Decide how many servings you will have. Multiply the number of servings by the number of carbohydrates per serving. The resulting number is the total amount of carbohydrates that you will be having. Learning the standard serving sizes of foods When you eat carbohydrate foods that are not packaged or do not include Nutrition Facts on the label, you need to measure the servings in order to count the amount of carbohydrates. Measure the foods that you will eat with a food scale or measuring cup, if needed. Decide how  many standard-size servings you will eat. Multiply the number of servings by 15. For foods that contain carbohydrates, one serving equals 15 g of carbohydrates. For example, if you eat 2 cups or 10 oz (300 g) of strawberries, you will have eaten 2 servings and 30 g of carbohydrates (2 servings x 15 g = 30 g). For foods that have more than one food mixed, such as soups and casseroles, you must count the carbohydrates in each food that is included. The following list contains standard serving sizes of common carbohydrate-rich foods. Each of these servings has about 15 g of carbohydrates: 1 slice of bread. 1 six-inch (15 cm) tortilla. ? cup or 2 oz (53 g) cooked rice or pasta.  cup or 3 oz (85 g) cooked or canned, drained and rinsed beans or lentils.  cup or 3 oz (85 g) starchy vegetable, such as peas, corn, or squash.  cup or 4 oz (120 g) hot cereal.  cup or 3 oz (85 g) boiled or mashed potatoes, or  or 3 oz (85 g) of a large baked potato.  cup or 4 fl oz (118 mL) fruit juice. 1 cup or 8 fl oz (237 mL) milk. 1 small or 4 oz (106 g) apple.  or 2 oz (63 g) of a medium banana. 1 cup or 5 oz (150 g) strawberries. 3 cups or 1 oz (24 g) popped popcorn. What is an example of carbohydrate counting? To calculate the number of carbohydrates in this sample meal, follow the steps shown below. Sample meal 3 oz (85 g) chicken breast. ? cup or 4 oz (106 g) brown   rice.  cup or 3 oz (85 g) corn. 1 cup or 8 fl oz (237 mL) milk. 1 cup or 5 oz (150 g) strawberries with sugar-free whipped topping. Carbohydrate calculation Identify the foods that contain carbohydrates: Rice. Corn. Milk. Strawberries. Calculate how many servings you have of each food: 2 servings rice. 1 serving corn. 1 serving milk. 1 serving strawberries. Multiply each number of servings by 15 g: 2 servings rice x 15 g = 30 g. 1 serving corn x 15 g = 15 g. 1 serving milk x 15 g = 15 g. 1 serving strawberries x 15 g = 15  g. Add together all of the amounts to find the total grams of carbohydrates eaten: 30 g + 15 g + 15 g + 15 g = 75 g of carbohydrates total. What are tips for following this plan? Shopping Develop a meal plan and then make a shopping list. Buy fresh and frozen vegetables, fresh and frozen fruit, dairy, eggs, beans, lentils, and whole grains. Look at food labels. Choose foods that have more fiber and less sugar. Avoid processed foods and foods with added sugars. Meal planning Aim to have the same amount of carbohydrates at each meal and for each snack time. Plan to have regular, balanced meals and snacks. Where to find more information American Diabetes Association: www.diabetes.org Centers for Disease Control and Prevention: www.cdc.gov Summary Carbohydrate counting is a method of keeping track of how many carbohydrates you eat. Eating carbohydrates naturally increases the amount of sugar (glucose) in the blood. Counting how many carbohydrates you eat improves your blood glucose control, which helps you manage your diabetes. A dietitian can help you make a meal plan and calculate how many carbohydrates you should have at each meal and snack. This information is not intended to replace advice given to you by your health care provider. Make sure you discuss any questions you have with your health care provider. Document Revised: 03/23/2019 Document Reviewed: 03/24/2019 Elsevier Patient Education  2021 [employer]  

## 2021-01-02 NOTE — Progress Notes (Signed)
Established Patient Office Visit  Subjective:  Patient ID: Ebony Curtis, female    DOB: July 11, 1960  Age: 60 y.o. MRN: [id number]  CC:  Chief Complaint  Patient presents with   Diabetes    Here for follow up   Hypertension    Here for follow up    HPI Ebony Curtis presents for f/u dm and chol  HPI HYPERTENSION Blood pressure range-not checking   Chest pain- no      Dyspnea- no Lightheadedness- no   Edema- no Other side effects - no   Medication compliance: good Low salt diet- no  DIABETES  Blood Sugar ranges-not checking   Polyuria- no New Visual problems- no Hypoglycemic symptoms- no Other side effects-no Medication compliance - good Last eye exam- due Foot exam- today  HYPERLIPIDEMIA  Medication compliance- good RUQ pain- no  Muscle aches- no Other side effects-no    Past Medical History:  Diagnosis Date   Chronic lower back pain    DVT (deep venous thrombosis) (HCC) "years ago"   RUE   Family history of adverse reaction to anesthesia    "sister's heart went out doing routine OR; she never woke up" (06/01/2016)   GERD (gastroesophageal reflux disease)    Heart murmur    "when I was young"   Hyperlipidemia    Migraines    "none in years" (06/01/2016)    Past Surgical History:  Procedure Laterality Date   TONSILLECTOMY  1970s    Family History  Problem Relation Age of Onset   Diabetes Sister 19   Heart disease Sister    Heart disease Father 20   Arthritis Mother    Heart disease Mother        CABG, PACER   Coronary artery disease Other     Social History   Socioeconomic History   Marital status: Married    Spouse name: Not on file   Number of children: Not on file   Years of education: Not on file   Highest education level: Not on file  Occupational History   Occupation: disable    Employer: [employer]    Comment: disability/ retired  Tobacco Use   Smoking status: Every Day    Packs/day: 1.00    Years: 35.00     Pack years: 35.00    Types: Cigarettes   Smokeless tobacco: Never  Substance and Sexual Activity   Alcohol use: Not Currently    Comment: 06/01/2016 "last drink was 10/2015"   Drug use: No   Sexual activity: Yes    Partners: Male  Other Topics Concern   Not on file  Social History Narrative   Exercise--goes to the [employer]   Social Determinants of Health   Financial Resource Strain: Not on file  Food Insecurity: Not on file  Transportation Needs: Not on file  Physical Activity: Not on file  Stress: Not on file  Social Connections: Not on file  Intimate Partner Violence: Not on file    Outpatient Medications Prior to Visit  Medication Sig Dispense Refill   Albuterol Sulfate (PROAIR RESPICLICK) 270 (90 Base) MCG/ACT AEPB 2 puffs qid prn 1 each 1   aspirin 81 MG tablet Take 81 mg by mouth daily.     budesonide-formoterol (SYMBICORT) 80-4.5 MCG/ACT inhaler Inhale 2 puffs into the lungs 2 (two) times daily. 1 each 3   cetirizine (ZYRTEC) 10 MG tablet TAKE 1 TABLET(10 MG) BY MOUTH DAILY AS NEEDED FOR ALLERGIES 30 tablet 11   diclofenac  sodium (VOLTAREN) 1 % GEL Apply 2 g topically 4 (four) times daily as needed (for back pain). 100 g 5   fluticasone (FLONASE) 50 MCG/ACT nasal spray Place 2 sprays into both nostrils daily. 48 g 1   GARLIC PO Take 1 capsule by mouth daily.     glucose blood test strip Use as instructed 100 each 12   ibuprofen (ADVIL,MOTRIN) 200 MG tablet Take 400 mg by mouth every 6 (six) hours as needed (for back pain).     Multiple Vitamins-Minerals (CENTRUM SILVER 50+WOMEN) TABS Take 1 tablet by mouth daily.     ONETOUCH DELICA LANCETS FINE MISC Use as directed once a day.  DX: E11.9 100 each 2   rosuvastatin (CRESTOR) 20 MG tablet TAKE 1 TABLET(20 MG) BY MOUTH DAILY 90 tablet 1   vitamin E 180 MG (400 UNITS) capsule Take 400 Units by mouth daily.     sitaGLIPtin (JANUVIA) 100 MG tablet Take 1 tablet (100 mg total) by mouth daily. 90 tablet 1   azithromycin (ZITHROMAX  Z-PAK) 250 MG tablet As directed 6 each 0   promethazine-dextromethorphan (PROMETHAZINE-DM) 6.25-15 MG/5ML syrup Take 5 mLs by mouth 4 (four) times daily as needed for cough. 118 mL 0   No facility-administered medications prior to visit.    No Known Allergies  ROS Review of Systems  Constitutional:  Negative for appetite change, diaphoresis, fatigue and unexpected weight change.  Eyes:  Negative for pain, redness and visual disturbance.  Respiratory:  Negative for cough, chest tightness, shortness of breath and wheezing.   Cardiovascular:  Negative for chest pain, palpitations and leg swelling.  Endocrine: Negative for cold intolerance, heat intolerance, polydipsia, polyphagia and polyuria.  Genitourinary:  Negative for difficulty urinating, dysuria and frequency.  Neurological:  Negative for dizziness, light-headedness, numbness and headaches.     Objective:    Physical Exam Vitals and nursing note reviewed.  Constitutional:      Appearance: She is well-developed.  HENT:     Head: Normocephalic and atraumatic.  Eyes:     Conjunctiva/sclera: Conjunctivae normal.  Neck:     Thyroid: No thyromegaly.     Vascular: No carotid bruit or JVD.  Cardiovascular:     Rate and Rhythm: Normal rate and regular rhythm.     Heart sounds: Normal heart sounds. No murmur heard. Pulmonary:     Effort: Pulmonary effort is normal. No respiratory distress.     Breath sounds: Normal breath sounds. No wheezing or rales.  Chest:     Chest wall: No tenderness.  Musculoskeletal:     Cervical back: Normal range of motion and neck supple.  Neurological:     Mental Status: She is alert and oriented to person, place, and time.    BP (!) 179/72 (BP Location: Right Arm, Patient Position: Sitting, Cuff Size: Small)   Pulse 71   Temp 98.9 F (37.2 C) (Oral)   Resp 16   Ht 5\' 1"  (1.549 m)   Wt 242 lb (109.8 kg)   SpO2 98%   BMI 45.73 kg/m  Wt Readings from Last 3 Encounters:  01/02/21 242 lb  (109.8 kg)  06/20/20 238 lb 12.8 oz (108.3 kg)  02/01/20 239 lb (108.4 kg)     Health Maintenance Due  Topic Date Due   COLONOSCOPY (Pts 45-88yrs Insurance coverage will need to be confirmed)  Never done   Zoster Vaccines- Shingrix (1 of 2) Never done   OPHTHALMOLOGY EXAM  09/01/2017   MAMMOGRAM  09/10/2017  PAP SMEAR-Modifier  04/01/2018   TETANUS/TDAP  08/27/2019   COVID-19 Vaccine (3 - Booster for Pfizer series) 12/14/2019   INFLUENZA VACCINE  Never done   HEMOGLOBIN A1C  12/21/2020    There are no preventive care reminders to display for this patient.  Lab Results  Component Value Date   TSH 1.28 03/07/2018   Lab Results  Component Value Date   WBC 9.0 06/30/2019   HGB 14.6 06/30/2019   HCT 43.4 06/30/2019   MCV 92.7 06/30/2019   PLT 271.0 06/30/2019   Lab Results  Component Value Date   NA 139 06/20/2020   K 5.1 06/20/2020   CO2 29 06/20/2020   GLUCOSE 102 (H) 06/20/2020   BUN 11 06/20/2020   CREATININE 0.80 06/20/2020   BILITOT 0.3 06/20/2020   ALKPHOS 53 06/20/2020   AST 13 06/20/2020   ALT 14 06/20/2020   PROT 7.2 06/20/2020   ALBUMIN 4.1 06/20/2020   CALCIUM 9.6 06/20/2020   ANIONGAP 8 06/02/2016   GFR 80.19 06/20/2020   Lab Results  Component Value Date   CHOL 166 06/20/2020   Lab Results  Component Value Date   HDL 49.20 06/20/2020   Lab Results  Component Value Date   LDLCALC 101 (H) 06/20/2020   Lab Results  Component Value Date   TRIG 81.0 06/20/2020   Lab Results  Component Value Date   CHOLHDL 3 06/20/2020   Lab Results  Component Value Date   HGBA1C 6.6 (H) 06/20/2020      Assessment & Plan:   Problem List Items Addressed This Visit       Unprioritized   Essential hypertension    Poorly controlled will alter medications, encouraged DASH diet, minimize caffeine and obtain adequate sleep. Report concerning symptoms and follow up as directed and as needed      Relevant Medications   losartan (COZAAR) 50 MG tablet    Hyperlipidemia associated with type 2 diabetes mellitus ([place])    Tolerating statin, encouraged heart healthy diet, avoid trans fats, minimize simple carbs and saturated fats. Increase exercise as tolerated      Relevant Medications   losartan (COZAAR) 50 MG tablet   Semaglutide,0.25 or 0.5MG /DOS, (OZEMPIC, 0.25 OR 0.5 MG/DOSE,) 2 MG/1.5ML SOPN   Other Relevant Orders   Hemoglobin A1c   CBC with Differential/Platelet   Comprehensive metabolic panel   Lipid panel   Primary hypertension   Relevant Medications   losartan (COZAAR) 50 MG tablet   Other Relevant Orders   Hemoglobin A1c   CBC with Differential/Platelet   Comprehensive metabolic panel   Lipid panel   Uncontrolled type 2 diabetes mellitus with hyperglycemia ([place]) - Primary    hgba1c to be checked, minimize simple carbs. Increase exercise as tolerated. Continue current meds       Relevant Medications   losartan (COZAAR) 50 MG tablet   Semaglutide,0.25 or 0.5MG /DOS, (OZEMPIC, 0.25 OR 0.5 MG/DOSE,) 2 MG/1.5ML SOPN   Other Relevant Orders   Hemoglobin A1c   CBC with Differential/Platelet   Comprehensive metabolic panel   Lipid panel   Microalbumin / creatinine urine ratio    Meds ordered this encounter  Medications   losartan (COZAAR) 50 MG tablet    Sig: Take 1 tablet (50 mg total) by mouth daily.    Dispense:  90 tablet    Refill:  1   Semaglutide,0.25 or 0.5MG /DOS, (OZEMPIC, 0.25 OR 0.5 MG/DOSE,) 2 MG/1.5ML SOPN    Sig: Inject 0.25 mg into the skin once a  week.    Dispense:  4.5 mL    Refill:  1    Follow-up: Return in about 6 months (around 07/02/2021), or if symptoms worsen or fail to improve, for hypertension, hyperlipidemia, diabetes II.    Ann Held, DO

## 2021-01-02 NOTE — Assessment & Plan Note (Signed)
Poorly controlled will alter medications, encouraged DASH diet, minimize caffeine and obtain adequate sleep. Report concerning symptoms and follow up as directed and as needed 

## 2021-01-03 NOTE — Progress Notes (Signed)
   Covid-19 Vaccination Clinic  Name:  Ebony Curtis    MRN: [id number] DOB: 07-19-1960  01/03/2021  Ebony Curtis was observed post Covid-19 immunization for 15 minutes without incident. She was provided with Vaccine Information Sheet and instruction to access the V-Safe system.   Ebony Curtis was instructed to call 911 with any severe reactions post vaccine: Difficulty breathing  Swelling of face and throat  A fast heartbeat  A bad rash all over body  Dizziness and weakness

## 2021-01-13 ENCOUNTER — Other Ambulatory Visit (HOSPITAL_BASED_OUTPATIENT_CLINIC_OR_DEPARTMENT_OTHER): Payer: Self-pay

## 2021-01-13 MED ORDER — COVID-19MRNA BIVAL VACC PFIZER 30 MCG/0.3ML IM SUSP
INTRAMUSCULAR | 0 refills | Status: DC
  Filled 2021-01-13: qty 0.3, 1d supply, fill #0

## 2021-02-12 ENCOUNTER — Other Ambulatory Visit (HOSPITAL_BASED_OUTPATIENT_CLINIC_OR_DEPARTMENT_OTHER): Payer: Self-pay

## 2021-03-11 ENCOUNTER — Other Ambulatory Visit (HOSPITAL_BASED_OUTPATIENT_CLINIC_OR_DEPARTMENT_OTHER): Payer: Self-pay

## 2021-03-12 ENCOUNTER — Other Ambulatory Visit (HOSPITAL_BASED_OUTPATIENT_CLINIC_OR_DEPARTMENT_OTHER): Payer: Self-pay

## 2021-05-27 ENCOUNTER — Telehealth: Payer: Self-pay | Admitting: Family Medicine

## 2021-05-27 NOTE — Telephone Encounter (Signed)
Patient states she is due to get a refill for her ozempic on 02/23 but is unsure if she should get it then since her dose might be upped on her 02/24 appointment. She would like advise on what to do. Please advise.

## 2021-05-28 NOTE — Telephone Encounter (Signed)
Pt will come pick up sample either today or tomorrow. Sample in [place] near [place] on bottom shelf with a sticky note.

## 2021-05-29 ENCOUNTER — Other Ambulatory Visit (HOSPITAL_BASED_OUTPATIENT_CLINIC_OR_DEPARTMENT_OTHER): Payer: Self-pay

## 2021-05-30 ENCOUNTER — Encounter: Payer: Self-pay | Admitting: Family Medicine

## 2021-05-30 ENCOUNTER — Ambulatory Visit: Payer: Federal, State, Local not specified - PPO | Admitting: Family Medicine

## 2021-05-30 VITALS — BP 128/78 | HR 81 | Temp 98.0°F | Resp 18 | Ht 61.0 in | Wt 237.8 lb

## 2021-05-30 DIAGNOSIS — E1169 Type 2 diabetes mellitus with other specified complication: Secondary | ICD-10-CM

## 2021-05-30 DIAGNOSIS — E1165 Type 2 diabetes mellitus with hyperglycemia: Secondary | ICD-10-CM

## 2021-05-30 DIAGNOSIS — Z23 Encounter for immunization: Secondary | ICD-10-CM

## 2021-05-30 DIAGNOSIS — K921 Melena: Secondary | ICD-10-CM | POA: Diagnosis not present

## 2021-05-30 DIAGNOSIS — E785 Hyperlipidemia, unspecified: Secondary | ICD-10-CM

## 2021-05-30 DIAGNOSIS — I10 Essential (primary) hypertension: Secondary | ICD-10-CM | POA: Diagnosis not present

## 2021-05-30 DIAGNOSIS — Z1231 Encounter for screening mammogram for malignant neoplasm of breast: Secondary | ICD-10-CM

## 2021-05-30 DIAGNOSIS — E2839 Other primary ovarian failure: Secondary | ICD-10-CM

## 2021-05-30 LAB — COMPREHENSIVE METABOLIC PANEL
ALT: 11 U/L (ref 0–35)
AST: 12 U/L (ref 0–37)
Albumin: 4.1 g/dL (ref 3.5–5.2)
Alkaline Phosphatase: 52 U/L (ref 39–117)
BUN: 10 mg/dL (ref 6–23)
CO2: 33 mEq/L — ABNORMAL HIGH (ref 19–32)
Calcium: 9.3 mg/dL (ref 8.4–10.5)
Chloride: 105 mEq/L (ref 96–112)
Creatinine, Ser: 0.83 mg/dL (ref 0.40–1.20)
GFR: 76.22 mL/min (ref >60.00)
Glucose, Bld: 87 mg/dL (ref 70–99)
Potassium: 4.5 mEq/L (ref 3.5–5.1)
Sodium: 140 mEq/L (ref 135–145)
Total Bilirubin: 0.4 mg/dL (ref 0.2–1.2)
Total Protein: 6.8 g/dL (ref 6.0–8.3)

## 2021-05-30 LAB — LIPID PANEL
Cholesterol: 134 mg/dL (ref 0–200)
HDL: 40.4 mg/dL (ref >39.00)
LDL Cholesterol: 77 mg/dL (ref 0–99)
NonHDL: 94.05
Total CHOL/HDL Ratio: 3
Triglycerides: 87 mg/dL (ref 0.0–149.0)
VLDL: 17.4 mg/dL (ref 0.0–40.0)

## 2021-05-30 LAB — CBC WITH DIFFERENTIAL/PLATELET
Basophils Absolute: 0 10*3/uL (ref 0.0–0.1)
Basophils Relative: 0.4 % (ref 0.0–3.0)
Eosinophils Absolute: 0.1 10*3/uL (ref 0.0–0.7)
Eosinophils Relative: 1.4 % (ref 0.0–5.0)
HCT: 42.4 % (ref 36.0–46.0)
Hemoglobin: 13.9 g/dL (ref 12.0–15.0)
Lymphocytes Relative: 51.7 % — ABNORMAL HIGH (ref 12.0–46.0)
Lymphs Abs: 4.1 10*3/uL — ABNORMAL HIGH (ref 0.7–4.0)
MCHC: 32.9 g/dL (ref 30.0–36.0)
MCV: 92.5 fl (ref 78.0–100.0)
Monocytes Absolute: 0.4 10*3/uL (ref 0.1–1.0)
Monocytes Relative: 5.5 % (ref 3.0–12.0)
Neutro Abs: 3.3 10*3/uL (ref 1.4–7.7)
Neutrophils Relative %: 41 % — ABNORMAL LOW (ref 43.0–77.0)
Platelets: 278 10*3/uL (ref 150.0–400.0)
RBC: 4.58 Mil/uL (ref 3.87–5.11)
RDW: 14.8 % (ref 11.5–15.5)
WBC: 8 10*3/uL (ref 4.0–10.5)

## 2021-05-30 LAB — MICROALBUMIN / CREATININE URINE RATIO
Creatinine,U: 209.2 mg/dL
Microalb Creat Ratio: 0.9 mg/g (ref 0.0–30.0)
Microalb, Ur: 1.9 mg/dL (ref 0.0–1.9)

## 2021-05-30 LAB — HEMOGLOBIN A1C: Hgb A1c MFr Bld: 6.5 % (ref 4.6–6.5)

## 2021-05-30 NOTE — Assessment & Plan Note (Signed)
hgba1c to be checked , , minimize simple carbs. Increase exercise as tolerated. Continue current meds  

## 2021-05-30 NOTE — Patient Instructions (Signed)

## 2021-05-30 NOTE — Progress Notes (Signed)
Subjective:   By signing my name below, I, Zite Okoli, attest that this documentation has been prepared under the direction and in the presence of Ann Held, DO. 05/30/2021   Patient ID: Ebony Curtis, female    DOB: Aug 21, 1960, 61 y.o.   MRN: [id number]  Chief Complaint  Patient presents with   Hypertension   Hyperlipidemia   Diabetes   Follow-up    HPI Patient is in today for an office visit and f/u on ozempic and diabetes  She reports having no problems on 9.25 mg ozempic. She states she had two occurrences of blood in the stool and is not sure what caused it. She noticed it on the tissue paper and in the stool in the mornings. She denies constipation ,abdominal pain and hemorrhoids.  She does not check her blood sugar levels.  She has not lost any weight but notes she has had a stressful past few months. Wt Readings from Last 3 Encounters:  05/30/21 237 lb 12.8 oz (107.9 kg)  01/02/21 242 lb (109.8 kg)  06/20/20 238 lb 12.8 oz (108.3 kg)   She has 3 Covid-19 vaccines at this time.  She is not interested in the shingles vaccine. She will receive the tetanus vaccine today.  Past Medical History:  Diagnosis Date   Chronic lower back pain    DVT (deep venous thrombosis) ([place]) "years ago"   RUE   Family history of adverse reaction to anesthesia    "sister's heart went out doing routine OR; she never woke up" (06/01/2016)   GERD (gastroesophageal reflux disease)    Heart murmur    "when I was young"   Hyperlipidemia    Migraines    "none in years" (06/01/2016)    Past Surgical History:  Procedure Laterality Date   TONSILLECTOMY  1970s    Family History  Problem Relation Age of Onset   Diabetes Sister 66   Heart disease Sister    Heart disease Father 33   Arthritis Mother    Heart disease Mother        CABG, PACER   Coronary artery disease Other     Social History   Socioeconomic History   Marital status: Married    Spouse name: Not on file    Number of children: Not on file   Years of education: Not on file   Highest education level: Not on file  Occupational History   Occupation: disable    Employer: [employer]    Comment: disability/ retired  Tobacco Use   Smoking status: Every Day    Packs/day: 1.00    Years: 35.00    Pack years: 35.00    Types: Cigarettes   Smokeless tobacco: Never  Substance and Sexual Activity   Alcohol use: Not Currently    Comment: 06/01/2016 "last drink was 10/2015"   Drug use: No   Sexual activity: Yes    Partners: Male  Other Topics Concern   Not on file  Social History Narrative   Exercise--goes to the [employer]   Social Determinants of Health   Financial Resource Strain: Not on file  Food Insecurity: Not on file  Transportation Needs: Not on file  Physical Activity: Not on file  Stress: Not on file  Social Connections: Not on file  Intimate Partner Violence: Not on file    Outpatient Medications Prior to Visit  Medication Sig Dispense Refill   Albuterol Sulfate (PROAIR RESPICLICK) 867 (90 Base) MCG/ACT AEPB 2 puffs  qid prn 1 each 1   aspirin 81 MG tablet Take 81 mg by mouth daily.     budesonide-formoterol (SYMBICORT) 80-4.5 MCG/ACT inhaler Inhale 2 puffs into the lungs 2 (two) times daily. 1 each 3   cetirizine (ZYRTEC) 10 MG tablet TAKE 1 TABLET(10 MG) BY MOUTH DAILY AS NEEDED FOR ALLERGIES 30 tablet 11   diclofenac sodium (VOLTAREN) 1 % GEL Apply 2 g topically 4 (four) times daily as needed (for back pain). 100 g 5   fluticasone (FLONASE) 50 MCG/ACT nasal spray Place 2 sprays into both nostrils daily. 48 g 1   GARLIC PO Take 1 capsule by mouth daily.     glucose blood test strip Use as instructed 100 each 12   ibuprofen (ADVIL,MOTRIN) 200 MG tablet Take 400 mg by mouth every 6 (six) hours as needed (for back pain).     losartan (COZAAR) 50 MG tablet Take 1 tablet (50 mg total) by mouth daily. 90 tablet 1   Multiple Vitamins-Minerals (CENTRUM SILVER 50+WOMEN) TABS Take 1  tablet by mouth daily.     ONETOUCH DELICA LANCETS FINE MISC Use as directed once a day.  DX: E11.9 100 each 2   rosuvastatin (CRESTOR) 20 MG tablet TAKE 1 TABLET(20 MG) BY MOUTH DAILY 90 tablet 1   Semaglutide,0.25 or 0.5MG /DOS, (OZEMPIC, 0.25 OR 0.5 MG/DOSE,) 2 MG/1.5ML SOPN Inject 0.25 mg into the skin once a week. 4.5 mL 1   vitamin E 180 MG (400 UNITS) capsule Take 400 Units by mouth daily.     COVID-19 mRNA bivalent vaccine, Pfizer, injection Inject into the muscle. (Patient not taking: Reported on 05/30/2021) 0.3 mL 0   No facility-administered medications prior to visit.    No Known Allergies  Review of Systems  Constitutional:  Negative for fever.  HENT:  Negative for congestion, ear pain, hearing loss, sinus pain and sore throat.   Eyes:  Negative for blurred vision and pain.  Respiratory:  Negative for cough, sputum production, shortness of breath and wheezing.   Cardiovascular:  Negative for chest pain and palpitations.  Gastrointestinal:  Positive for blood in stool. Negative for constipation, diarrhea, nausea and vomiting.  Genitourinary:  Negative for dysuria, frequency, hematuria and urgency.  Musculoskeletal:  Negative for back pain, falls and myalgias.  Neurological:  Negative for dizziness, sensory change, loss of consciousness, weakness and headaches.  Endo/Heme/Allergies:  Negative for environmental allergies. Does not bruise/bleed easily.  Psychiatric/Behavioral:  Negative for depression and suicidal ideas. The patient is not nervous/anxious and does not have insomnia.       Objective:    Physical Exam Constitutional:      General: She is not in acute distress.    Appearance: Normal appearance. She is not ill-appearing.  HENT:     Head: Normocephalic and atraumatic.     Right Ear: External ear normal.     Left Ear: External ear normal.  Eyes:     Extraocular Movements: Extraocular movements intact.     Pupils: Pupils are equal, round, and reactive to light.   Cardiovascular:     Rate and Rhythm: Normal rate and regular rhythm.     Pulses: Normal pulses.     Heart sounds: Normal heart sounds. No murmur heard.   No gallop.  Pulmonary:     Effort: Pulmonary effort is normal. No respiratory distress.     Breath sounds: Normal breath sounds. No wheezing, rhonchi or rales.  Abdominal:     General: Bowel sounds are normal. There  is no distension.     Palpations: Abdomen is soft. There is no mass.     Tenderness: There is no abdominal tenderness. There is no guarding or rebound.     Hernia: No hernia is present.  Musculoskeletal:     Cervical back: Normal range of motion and neck supple.  Lymphadenopathy:     Cervical: No cervical adenopathy.  Skin:    General: Skin is warm and dry.  Neurological:     Mental Status: She is alert and oriented to person, place, and time.  Psychiatric:        Behavior: Behavior normal.    BP 128/78 (BP Location: Right Arm, Patient Position: Sitting, Cuff Size: Large)    Pulse 81    Temp 98 F (36.7 C) (Oral)    Resp 18    Ht 5\' 1"  (1.549 m)    Wt 237 lb 12.8 oz (107.9 kg)    SpO2 93%    BMI 44.93 kg/m  Wt Readings from Last 3 Encounters:  05/30/21 237 lb 12.8 oz (107.9 kg)  01/02/21 242 lb (109.8 kg)  06/20/20 238 lb 12.8 oz (108.3 kg)    Diabetic Foot Exam - Simple   Simple Foot Form Diabetic Foot exam was performed with the following findings: Yes 05/30/2021  9:49 AM  Visual Inspection No deformities, no ulcerations, no other skin breakdown bilaterally: Yes Sensation Testing Intact to touch and monofilament testing bilaterally: Yes Pulse Check Posterior Tibialis and Dorsalis pulse intact bilaterally: Yes Comments    Lab Results  Component Value Date   WBC 7.3 01/02/2021   HGB 14.5 01/02/2021   HCT 44.2 01/02/2021   PLT 268.0 01/02/2021   GLUCOSE 93 01/02/2021   CHOL 146 01/02/2021   TRIG 73.0 01/02/2021   HDL 42.80 01/02/2021   LDLDIRECT 153.1 03/15/2012   LDLCALC 88 01/02/2021   ALT 14  01/02/2021   AST 13 01/02/2021   NA 140 01/02/2021   K 4.6 01/02/2021   CL 104 01/02/2021   CREATININE 0.92 01/02/2021   BUN 10 01/02/2021   CO2 28 01/02/2021   TSH 1.28 03/07/2018   HGBA1C 6.7 (H) 01/02/2021   MICROALBUR 2.1 (H) 01/02/2021    Lab Results  Component Value Date   TSH 1.28 03/07/2018   Lab Results  Component Value Date   WBC 7.3 01/02/2021   HGB 14.5 01/02/2021   HCT 44.2 01/02/2021   MCV 92.3 01/02/2021   PLT 268.0 01/02/2021   Lab Results  Component Value Date   NA 140 01/02/2021   K 4.6 01/02/2021   CO2 28 01/02/2021   GLUCOSE 93 01/02/2021   BUN 10 01/02/2021   CREATININE 0.92 01/02/2021   BILITOT 0.4 01/02/2021   ALKPHOS 58 01/02/2021   AST 13 01/02/2021   ALT 14 01/02/2021   PROT 7.2 01/02/2021   ALBUMIN 4.3 01/02/2021   CALCIUM 9.7 01/02/2021   ANIONGAP 8 06/02/2016   GFR 67.56 01/02/2021   Lab Results  Component Value Date   CHOL 146 01/02/2021   Lab Results  Component Value Date   HDL 42.80 01/02/2021   Lab Results  Component Value Date   LDLCALC 88 01/02/2021   Lab Results  Component Value Date   TRIG 73.0 01/02/2021   Lab Results  Component Value Date   CHOLHDL 3 01/02/2021   Lab Results  Component Value Date   HGBA1C 6.7 (H) 01/02/2021       Assessment & Plan:   Problem List Items Addressed  This Visit       Unprioritized   Primary hypertension   Relevant Orders   Comprehensive metabolic panel   CBC with Differential/Platelet   Lipid panel   Hemoglobin A1c   Microalbumin / creatinine urine ratio   Essential hypertension    Well controlled, no changes to meds. Encouraged heart healthy diet such as the DASH diet and exercise as tolerated.        Hyperlipidemia associated with type 2 diabetes mellitus ([place])    Encourage heart healthy diet such as MIND or DASH diet, increase exercise, avoid trans fats, simple carbohydrates and processed foods, consider a krill or fish or flaxseed oil cap daily.        Relevant Orders   Comprehensive metabolic panel   CBC with Differential/Platelet   Lipid panel   Hemoglobin A1c   Microalbumin / creatinine urine ratio   Uncontrolled type 2 diabetes mellitus with hyperglycemia ([place])    hgba1c to be checked,  , minimize simple carbs. Increase exercise as tolerated. Continue current meds       Other Visit Diagnoses     Type 2 diabetes mellitus with hyperglycemia, without long-term current use of insulin (HCC)    -  Primary   Relevant Orders   Comprehensive metabolic panel   CBC with Differential/Platelet   Lipid panel   Hemoglobin A1c   Microalbumin / creatinine urine ratio   Blood in stool       Relevant Orders   Fecal occult blood, imunochemical   Ambulatory referral to Gastroenterology   Estrogen deficiency       Relevant Orders   DG Bone Density   Screening mammogram for breast cancer       Relevant Orders   MM DIGITAL SCREENING BILATERAL   Need for tetanus booster       Relevant Orders   Tdap vaccine greater than or equal to 7yo IM (Completed)       No orders of the defined types were placed in this encounter.   I,Zite Okoli,acting as a Education administrator for [employer], DO.,have documented all relevant documentation on the behalf of Ann Held, DO,as directed by  Ann Held, DO while in the presence of Ann Held, [place], DO., personally preformed the services described in this documentation.  All medical record entries made by the scribe were at my direction and in my presence.  I have reviewed the chart and discharge instructions (if applicable) and agree that the record reflects my personal performance and is accurate and complete. 05/30/2021

## 2021-05-30 NOTE — Assessment & Plan Note (Signed)
Encourage heart healthy diet such as MIND or DASH diet, increase exercise, avoid trans fats, simple carbohydrates and processed foods, consider a krill or fish or flaxseed oil cap daily.  °

## 2021-05-30 NOTE — Assessment & Plan Note (Signed)
Well controlled, no changes to meds. Encouraged heart healthy diet such as the DASH diet and exercise as tolerated.  °

## 2021-06-19 ENCOUNTER — Other Ambulatory Visit: Payer: Self-pay | Admitting: Family Medicine

## 2021-06-19 ENCOUNTER — Telehealth (HOSPITAL_BASED_OUTPATIENT_CLINIC_OR_DEPARTMENT_OTHER): Payer: Self-pay

## 2021-06-19 ENCOUNTER — Telehealth: Payer: Self-pay | Admitting: Family Medicine

## 2021-06-19 ENCOUNTER — Other Ambulatory Visit (HOSPITAL_BASED_OUTPATIENT_CLINIC_OR_DEPARTMENT_OTHER): Payer: Self-pay

## 2021-06-19 MED ORDER — OZEMPIC (0.25 OR 0.5 MG/DOSE) 2 MG/1.5ML ~~LOC~~ SOPN
0.2500 mg | PEN_INJECTOR | SUBCUTANEOUS | 1 refills | Status: DC
  Filled 2021-06-19: qty 4.5, 84d supply, fill #0

## 2021-06-19 NOTE — Telephone Encounter (Signed)
Pt made aware

## 2021-06-19 NOTE — Telephone Encounter (Signed)
Would you like this patient to stay on her current dose of Ozempic? ?

## 2021-06-19 NOTE — Telephone Encounter (Signed)
Pt states she usually takes her ozempic shot today, but Dr.Lowne was waiting on test results before prescribing it. She would like to know the status, please advise.  ? ?[facility]  ?[address], [place], [place] [address]  ?Phone:  [phone]  Fax:  [phone]  ?

## 2021-07-03 ENCOUNTER — Telehealth (HOSPITAL_BASED_OUTPATIENT_CLINIC_OR_DEPARTMENT_OTHER): Payer: Self-pay

## 2021-07-04 ENCOUNTER — Telehealth (HOSPITAL_BASED_OUTPATIENT_CLINIC_OR_DEPARTMENT_OTHER): Payer: Self-pay

## 2021-07-22 ENCOUNTER — Other Ambulatory Visit (HOSPITAL_BASED_OUTPATIENT_CLINIC_OR_DEPARTMENT_OTHER): Payer: Self-pay

## 2021-07-22 ENCOUNTER — Other Ambulatory Visit: Payer: Self-pay | Admitting: Family Medicine

## 2021-07-22 DIAGNOSIS — I10 Essential (primary) hypertension: Secondary | ICD-10-CM

## 2021-09-04 ENCOUNTER — Other Ambulatory Visit (HOSPITAL_BASED_OUTPATIENT_CLINIC_OR_DEPARTMENT_OTHER): Payer: Self-pay

## 2021-09-04 MED ORDER — OZEMPIC (0.25 OR 0.5 MG/DOSE) 2 MG/3ML ~~LOC~~ SOPN
PEN_INJECTOR | SUBCUTANEOUS | 1 refills | Status: DC
  Filled 2021-09-04: qty 3, 28d supply, fill #0
  Filled 2021-09-04: qty 6, 56d supply, fill #0
  Filled 2021-10-02: qty 6, 56d supply, fill #1

## 2021-09-05 ENCOUNTER — Other Ambulatory Visit (HOSPITAL_BASED_OUTPATIENT_CLINIC_OR_DEPARTMENT_OTHER): Payer: Self-pay

## 2021-09-15 ENCOUNTER — Other Ambulatory Visit (HOSPITAL_BASED_OUTPATIENT_CLINIC_OR_DEPARTMENT_OTHER): Payer: Self-pay

## 2021-09-16 ENCOUNTER — Other Ambulatory Visit (HOSPITAL_BASED_OUTPATIENT_CLINIC_OR_DEPARTMENT_OTHER): Payer: Self-pay

## 2021-10-02 ENCOUNTER — Other Ambulatory Visit (HOSPITAL_BASED_OUTPATIENT_CLINIC_OR_DEPARTMENT_OTHER): Payer: Self-pay

## 2021-10-03 ENCOUNTER — Telehealth: Payer: Self-pay

## 2021-10-03 NOTE — Telephone Encounter (Signed)
Pt would like referral to [facility] prep program

## 2021-10-03 NOTE — Telephone Encounter (Signed)
Called her husband re: PREP program referral; she was on the phone too, and wants to attend; will need referral from provider; next class at [facility] will be 8/1, every T/Th 10-11:15; asked her to contact provider to send the referral; will contact her in mid July to set up assessment visit.

## 2021-10-08 NOTE — Telephone Encounter (Signed)
Sorry she said referral

## 2021-10-09 ENCOUNTER — Other Ambulatory Visit: Payer: Self-pay | Admitting: Family Medicine

## 2021-10-10 NOTE — Telephone Encounter (Signed)
Letter printed and mailed to patient per request

## 2021-11-25 ENCOUNTER — Other Ambulatory Visit: Payer: Self-pay | Admitting: Family Medicine

## 2021-11-25 ENCOUNTER — Other Ambulatory Visit (HOSPITAL_BASED_OUTPATIENT_CLINIC_OR_DEPARTMENT_OTHER): Payer: Self-pay

## 2021-11-25 MED ORDER — OZEMPIC (0.25 OR 0.5 MG/DOSE) 2 MG/3ML ~~LOC~~ SOPN
0.2500 mg | PEN_INJECTOR | SUBCUTANEOUS | 3 refills | Status: DC
  Filled 2021-11-25: qty 3, 42d supply, fill #0

## 2021-12-04 ENCOUNTER — Ambulatory Visit: Payer: Federal, State, Local not specified - PPO | Admitting: Family Medicine

## 2021-12-04 ENCOUNTER — Encounter: Payer: Self-pay | Admitting: Family Medicine

## 2021-12-04 ENCOUNTER — Ambulatory Visit (HOSPITAL_BASED_OUTPATIENT_CLINIC_OR_DEPARTMENT_OTHER)
Admission: RE | Admit: 2021-12-04 | Discharge: 2021-12-04 | Disposition: A | Discharge status: Home / Self Care | Payer: Federal, State, Local not specified - PPO | Source: Ambulatory Visit | Attending: Family Medicine | Admitting: Family Medicine

## 2021-12-04 VITALS — BP 132/92 | HR 78 | Temp 98.7°F | Resp 18 | Ht 61.0 in | Wt 230.6 lb

## 2021-12-04 DIAGNOSIS — E1165 Type 2 diabetes mellitus with hyperglycemia: Secondary | ICD-10-CM

## 2021-12-04 DIAGNOSIS — M546 Pain in thoracic spine: Secondary | ICD-10-CM | POA: Diagnosis present

## 2021-12-04 DIAGNOSIS — E785 Hyperlipidemia, unspecified: Secondary | ICD-10-CM

## 2021-12-04 DIAGNOSIS — I10 Essential (primary) hypertension: Secondary | ICD-10-CM | POA: Diagnosis not present

## 2021-12-04 DIAGNOSIS — R350 Frequency of micturition: Secondary | ICD-10-CM | POA: Diagnosis not present

## 2021-12-04 DIAGNOSIS — F17200 Nicotine dependence, unspecified, uncomplicated: Secondary | ICD-10-CM

## 2021-12-04 DIAGNOSIS — E1169 Type 2 diabetes mellitus with other specified complication: Secondary | ICD-10-CM

## 2021-12-04 LAB — POC URINALSYSI DIPSTICK (AUTOMATED)
Bilirubin, UA: NEGATIVE
Blood, UA: NEGATIVE
Glucose, UA: NEGATIVE
Ketones, UA: NEGATIVE
Leukocytes, UA: NEGATIVE
Nitrite, UA: NEGATIVE
Protein, UA: NEGATIVE
Spec Grav, UA: 1.01 (ref 1.010–1.025)
Urobilinogen, UA: 0.2 E.U./dL
pH, UA: 5 (ref 5.0–8.0)

## 2021-12-04 LAB — COMPREHENSIVE METABOLIC PANEL
ALT: 12 U/L (ref 0–35)
AST: 12 U/L (ref 0–37)
Albumin: 4 g/dL (ref 3.5–5.2)
Alkaline Phosphatase: 55 U/L (ref 39–117)
BUN: 9 mg/dL (ref 6–23)
CO2: 29 mEq/L (ref 19–32)
Calcium: 9.4 mg/dL (ref 8.4–10.5)
Chloride: 104 mEq/L (ref 96–112)
Creatinine, Ser: 0.72 mg/dL (ref 0.40–1.20)
GFR: 90.07 mL/min (ref >60.00)
Glucose, Bld: 78 mg/dL (ref 70–99)
Potassium: 4.6 mEq/L (ref 3.5–5.1)
Sodium: 140 mEq/L (ref 135–145)
Total Bilirubin: 0.4 mg/dL (ref 0.2–1.2)
Total Protein: 7 g/dL (ref 6.0–8.3)

## 2021-12-04 LAB — CBC WITH DIFFERENTIAL/PLATELET
Basophils Absolute: 0.1 10*3/uL (ref 0.0–0.1)
Basophils Relative: 0.8 % (ref 0.0–3.0)
Eosinophils Absolute: 0.1 10*3/uL (ref 0.0–0.7)
Eosinophils Relative: 1 % (ref 0.0–5.0)
HCT: 41.4 % (ref 36.0–46.0)
Hemoglobin: 13.7 g/dL (ref 12.0–15.0)
Lymphocytes Relative: 53.9 % — ABNORMAL HIGH (ref 12.0–46.0)
Lymphs Abs: 4.3 10*3/uL — ABNORMAL HIGH (ref 0.7–4.0)
MCHC: 33.1 g/dL (ref 30.0–36.0)
MCV: 92.8 fl (ref 78.0–100.0)
Monocytes Absolute: 0.4 10*3/uL (ref 0.1–1.0)
Monocytes Relative: 5.3 % (ref 3.0–12.0)
Neutro Abs: 3.1 10*3/uL (ref 1.4–7.7)
Neutrophils Relative %: 39 % — ABNORMAL LOW (ref 43.0–77.0)
Platelets: 265 10*3/uL (ref 150.0–400.0)
RBC: 4.47 Mil/uL (ref 3.87–5.11)
RDW: 15 % (ref 11.5–15.5)
WBC: 8 10*3/uL (ref 4.0–10.5)

## 2021-12-04 LAB — LIPID PANEL
Cholesterol: 155 mg/dL (ref 0–200)
HDL: 48.3 mg/dL (ref >39.00)
LDL Cholesterol: 92 mg/dL (ref 0–99)
NonHDL: 106.91
Total CHOL/HDL Ratio: 3
Triglycerides: 75 mg/dL (ref 0.0–149.0)
VLDL: 15 mg/dL (ref 0.0–40.0)

## 2021-12-04 LAB — HEMOGLOBIN A1C: Hgb A1c MFr Bld: 6.6 % — ABNORMAL HIGH (ref 4.6–6.5)

## 2021-12-04 MED ORDER — TIZANIDINE HCL 4 MG PO TABS: 4.0000 mg | ORAL_TABLET | Freq: Four times a day (QID) | ORAL | 0 refills | Status: AC | PRN

## 2021-12-04 NOTE — Assessment & Plan Note (Signed)
Slightly high today due to pain Well controlled, no changes to meds. Encouraged heart healthy diet such as the DASH diet and exercise as tolerated.

## 2021-12-04 NOTE — Assessment & Plan Note (Signed)
Quit smoking. 

## 2021-12-04 NOTE — Assessment & Plan Note (Signed)
Check xray 

## 2021-12-04 NOTE — Patient Instructions (Signed)
Flank Pain, Adult ?Flank pain is pain that is located on the side of the body between the upper abdomen and the spine. This area is called the flank. The pain may occur over a short period of time (acute), or it may be long-term or recurring (chronic). It may be mild or severe. Flank pain can be caused by many things, including: ?Muscle soreness or injury. ?Kidney infection, kidney stones, or kidney disease. ?Stress. ?A disease of the spine (vertebral disk disease). ?A lung infection (pneumonia). ?Fluid around the lungs (pulmonary edema). ?A skin rash caused by the chickenpox virus (shingles). ?Tumors that affect the back of the abdomen. ?Gallbladder disease. ?Follow these instructions at home: ? ?Drink enough fluid to keep your urine pale yellow. ?Rest as told by your health care provider. ?Take over-the-counter and prescription medicines only as told by your health care provider. ?Keep a journal to track what has caused your flank pain and what has made it feel better. ?Keep all follow-up visits. This is important. ?Contact a health care provider if: ?Your pain is not controlled with medicine. ?You have new symptoms. ?Your pain gets worse. ?Your symptoms last longer than 2-3 days. ?You have trouble urinating or you are urinating very frequently. ?Get help right away if: ?You have trouble breathing or you are short of breath. ?Your abdomen hurts or it is swollen or red. ?You have nausea or vomiting. ?You feel faint, or you faint. ?You have blood in your urine. ?You have flank pain and a fever. ?These symptoms may represent a serious problem that is an emergency. Do not wait to see if the symptoms will go away. Get medical help right away. Call your local emergency services (911 in the [place] Do not drive yourself to the hospital. ?Summary ?Flank pain is pain that is located on the side of the body between the upper abdomen and the spine. ?The pain may occur over a short period of time (acute), or it may be  long-term or recurring (chronic). It may be mild or severe. ?Flank pain can be caused by many things. ?Contact your health care provider if your symptoms get worse or last longer than 2-3 days. ?This information is not intended to replace advice given to you by your health care provider. Make sure you discuss any questions you have with your health care provider. ?Document Revised: 06/03/2020 Document Reviewed: 06/03/2020 ?Elsevier Patient Education ? 2023 Elsevier Inc. ? ?

## 2021-12-04 NOTE — Progress Notes (Signed)
Established Patient Office Visit  Subjective   Patient ID: Ebony Curtis, female    DOB: [birth year]  Age: 61 y.o. MRN: [id number]  Chief Complaint  Patient presents with   Back Pain    X2 weeks, pt states having lower back pain and urinary freq.     HPI Pt here c/o back pain x 2 weeks.  No known injury .  She is also having urinary frequency.   Patient Active Problem List   Diagnosis Date Noted   Thoracic spine pain 12/04/2021   Primary hypertension 01/02/2021   Acute bronchitis due to other specified organisms 06/20/2020   Bronchitis 06/20/2020   Back pain, chronic 09/09/2018   Seasonal allergies 08/27/2017   Uncontrolled type 2 diabetes mellitus with hyperglycemia ([place]) 08/27/2017   Chest pain 06/01/2016   Female incontinence 06/30/2010   BACK PAIN, LUMBAR, WITH RADICULOPATHY 04/23/2010   DYSURIA 04/23/2010   Depression with anxiety 11/12/2009   ONYCHOMYCOSIS, BILATERAL 08/05/2009   BACK PAIN, THORACIC REGION 07/22/2009   BENIGN POSITIONAL VERTIGO 05/24/2009   HEMATURIA, MICROSCOPIC, HX OF 05/24/2009   LUMBAR STRAIN, ACUTE 09/20/2008   ROTATOR CUFF INJURY, RIGHT SHOULDER 07/20/2008   Chronic right shoulder pain 07/20/2008   Essential hypertension 06/25/2008   UNSPECIFIED VITAMIN D DEFICIENCY 09/26/2007   Hyperlipidemia associated with type 2 diabetes mellitus ([place]) 07/28/2007   TOBACCO USE 07/28/2007   MIGRAINE 07/28/2007   GERD 07/28/2007   ELEVATED BLOOD PRESSURE 07/28/2007   Past Medical History:  Diagnosis Date   Chronic lower back pain    DVT (deep venous thrombosis) ([place]) "years ago"   RUE   Family history of adverse reaction to anesthesia    "sister's heart went out doing routine OR; she never woke up" (06/01/2016)   GERD (gastroesophageal reflux disease)    Heart murmur    "when I was young"   Hyperlipidemia    Migraines    "none in years" (06/01/2016)   Past Surgical History:  Procedure Laterality Date   TONSILLECTOMY  1970s   Social History    Tobacco Use   Smoking status: Every Day    Packs/day: 1.00    Years: 35.00    Total pack years: 35.00    Types: Cigarettes   Smokeless tobacco: Never  Substance Use Topics   Alcohol use: Not Currently    Comment: 06/01/2016 "last drink was 10/2015"   Drug use: No   Social History   Socioeconomic History   Marital status: Married    Spouse name: Not on file   Number of children: Not on file   Years of education: Not on file   Highest education level: Not on file  Occupational History   Occupation: disable    Employer: [employer]    Comment: disability/ retired  Tobacco Use   Smoking status: Every Day    Packs/day: 1.00    Years: 35.00    Total pack years: 35.00    Types: Cigarettes   Smokeless tobacco: Never  Substance and Sexual Activity   Alcohol use: Not Currently    Comment: 06/01/2016 "last drink was 10/2015"   Drug use: No   Sexual activity: Yes    Partners: Male  Other Topics Concern   Not on file  Social History Narrative   Exercise--goes to the [employer]   Social Determinants of Health   Financial Resource Strain: Not on file  Food Insecurity: Not on file  Transportation Needs: Not on file  Physical Activity: Not on file  Stress: Not on file  Social Connections: Not on file  Intimate Partner Violence: Not on file   Family Status  Relation Name Status   Sister  Deceased   Sister  Deceased   Father  Deceased at age 2   Mother  Deceased   Other  (Not Specified)   MGM  Deceased   MGF  Deceased   PGM  Deceased   PGF  Deceased   Family History  Problem Relation Age of Onset   Diabetes Sister 9   Heart disease Sister    Heart disease Father 70   Arthritis Mother    Heart disease Mother        CABG, PACER   Coronary artery disease Other    No Known Allergies    Review of Systems  Constitutional:  Negative for fever and malaise/fatigue.  HENT:  Negative for congestion.   Eyes:  Negative for blurred vision.  Respiratory:  Negative for  cough and shortness of breath.   Cardiovascular:  Negative for chest pain, palpitations and leg swelling.  Gastrointestinal:  Negative for vomiting.  Genitourinary:  Positive for flank pain and frequency. Negative for hematuria.  Musculoskeletal:  Positive for back pain.  Skin:  Negative for rash.  Neurological:  Negative for loss of consciousness and headaches.      Objective:     BP (!) 132/92 (BP Location: Left Arm, Patient Position: Sitting, Cuff Size: Large)   Pulse 78   Temp 98.7 F (37.1 C) (Oral)   Resp 18   Ht '5\' 1"'$  (1.549 m)   Wt 230 lb 9.6 oz (104.6 kg)   SpO2 96%   BMI 43.57 kg/m  BP Readings from Last 3 Encounters:  12/04/21 (!) 132/92  05/30/21 128/78  01/02/21 (!) 179/72   Wt Readings from Last 3 Encounters:  12/04/21 230 lb 9.6 oz (104.6 kg)  05/30/21 237 lb 12.8 oz (107.9 kg)  01/02/21 242 lb (109.8 kg)   SpO2 Readings from Last 3 Encounters:  12/04/21 96%  05/30/21 93%  01/02/21 98%      Physical Exam Vitals and nursing note reviewed.  Constitutional:      Appearance: She is well-developed.  HENT:     Head: Normocephalic and atraumatic.  Eyes:     Conjunctiva/sclera: Conjunctivae normal.  Neck:     Thyroid: No thyromegaly.     Vascular: No carotid bruit or JVD.  Cardiovascular:     Rate and Rhythm: Normal rate and regular rhythm.     Heart sounds: Normal heart sounds. No murmur heard. Pulmonary:     Effort: Pulmonary effort is normal. No respiratory distress.     Breath sounds: Normal breath sounds. No wheezing or rales.  Chest:     Chest wall: No tenderness.  Abdominal:     Tenderness: There is right CVA tenderness. There is no left CVA tenderness.  Musculoskeletal:        General: Tenderness present. No swelling.     Cervical back: Normal range of motion and neck supple.  Neurological:     Mental Status: She is alert and oriented to person, place, and time.      Results for orders placed or performed in visit on 12/04/21  POCT  Urinalysis Dipstick (Automated)  Result Value Ref Range   Color, UA yellow    Clarity, UA clear    Glucose, UA Negative Negative   Bilirubin, UA negative    Ketones, UA negative    Spec Grav, UA  1.010 1.010 - 1.025   Blood, UA negative    pH, UA 5.0 5.0 - 8.0   Protein, UA Negative Negative   Urobilinogen, UA 0.2 0.2 or 1.0 E.U./dL   Nitrite, UA negative    Leukocytes, UA Negative Negative    Last CBC Lab Results  Component Value Date   WBC 8.0 05/30/2021   HGB 13.9 05/30/2021   HCT 42.4 05/30/2021   MCV 92.5 05/30/2021   MCH 30.8 06/01/2016   RDW 14.8 05/30/2021   PLT 278.0 22/63/3354   Last metabolic panel Lab Results  Component Value Date   GLUCOSE 87 05/30/2021   NA 140 05/30/2021   K 4.5 05/30/2021   CL 105 05/30/2021   CO2 33 (H) 05/30/2021   BUN 10 05/30/2021   CREATININE 0.83 05/30/2021   GFRNONAA >60 06/02/2016   CALCIUM 9.3 05/30/2021   PROT 6.8 05/30/2021   ALBUMIN 4.1 05/30/2021   BILITOT 0.4 05/30/2021   ALKPHOS 52 05/30/2021   AST 12 05/30/2021   ALT 11 05/30/2021   ANIONGAP 8 06/02/2016   Last lipids Lab Results  Component Value Date   CHOL 134 05/30/2021   HDL 40.40 05/30/2021   LDLCALC 77 05/30/2021   LDLDIRECT 153.1 03/15/2012   TRIG 87.0 05/30/2021   CHOLHDL 3 05/30/2021   Last hemoglobin A1c Lab Results  Component Value Date   HGBA1C 6.5 05/30/2021   Last thyroid functions Lab Results  Component Value Date   TSH 1.28 03/07/2018   Last vitamin D Lab Results  Component Value Date   VD25OH 45 08/26/2009   Last vitamin B12 and Folate No results found for: "VITAMINB12", "FOLATE"    The 10-year ASCVD risk score (Arnett DK, et al., 2019) is: 27.1%    Assessment & Plan:   Problem List Items Addressed This Visit       Unprioritized   Uncontrolled type 2 diabetes mellitus with hyperglycemia ([place])    Check labs      TOBACCO USE    Quit smoking       Thoracic spine pain    Check xray       Relevant Medications    tiZANidine (ZANAFLEX) 4 MG tablet   Other Relevant Orders   DG Thoracic Spine 2 View   Primary hypertension    Slightly high today due to pain Well controlled, no changes to meds. Encouraged heart healthy diet such as the DASH diet and exercise as tolerated.       Relevant Orders   CBC with Differential/Platelet   Comprehensive metabolic panel   Hemoglobin A1c   Lipid panel   Hyperlipidemia associated with type 2 diabetes mellitus ([facility])    Encourage heart healthy diet such as MIND or DASH diet, increase exercise, avoid trans fats, simple carbohydrates and processed foods, consider a krill or fish or flaxseed oil cap daily.       Relevant Orders   CBC with Differential/Platelet   Comprehensive metabolic panel   Hemoglobin A1c   Lipid panel   Other Visit Diagnoses     Urinary frequency    -  Primary   Relevant Orders   POCT Urinalysis Dipstick (Automated) (Completed)   Urine Culture   Type 2 diabetes mellitus with hyperglycemia, without long-term current use of insulin ([facility])       Relevant Orders   CBC with Differential/Platelet   Comprehensive metabolic panel   Hemoglobin A1c   Lipid panel       Return in about 6 months (  around 06/04/2022), or if symptoms worsen or fail to improve, for fasting, annual exam.    Ann Held, DO

## 2021-12-04 NOTE — Assessment & Plan Note (Signed)
Check labs 

## 2021-12-04 NOTE — Assessment & Plan Note (Signed)
Encourage heart healthy diet such as MIND or DASH diet, increase exercise, avoid trans fats, simple carbohydrates and processed foods, consider a krill or fish or flaxseed oil cap daily.  °

## 2021-12-05 LAB — URINE CULTURE
MICRO NUMBER:: 13858317
SPECIMEN QUALITY:: ADEQUATE

## 2021-12-24 ENCOUNTER — Other Ambulatory Visit (HOSPITAL_BASED_OUTPATIENT_CLINIC_OR_DEPARTMENT_OTHER): Payer: Self-pay

## 2021-12-24 ENCOUNTER — Other Ambulatory Visit: Payer: Self-pay | Admitting: Family Medicine

## 2021-12-24 MED ORDER — OZEMPIC (0.25 OR 0.5 MG/DOSE) 2 MG/3ML ~~LOC~~ SOPN
0.5000 mg | PEN_INJECTOR | SUBCUTANEOUS | 3 refills | Status: DC
  Filled 2021-12-24: qty 3, fill #0
  Filled 2021-12-25: qty 3, 28d supply, fill #0

## 2021-12-25 ENCOUNTER — Other Ambulatory Visit (HOSPITAL_BASED_OUTPATIENT_CLINIC_OR_DEPARTMENT_OTHER): Payer: Self-pay

## 2021-12-25 ENCOUNTER — Telehealth: Payer: Self-pay | Admitting: Family Medicine

## 2021-12-25 NOTE — Telephone Encounter (Signed)
Pt called stating that her Ozempic Rx was supposed to be for three months instead of 28 days as she has a discount for it but only if it is filled for three months. Pt needs the Rx rewritten when possible.

## 2021-12-26 ENCOUNTER — Other Ambulatory Visit (HOSPITAL_BASED_OUTPATIENT_CLINIC_OR_DEPARTMENT_OTHER): Payer: Self-pay

## 2021-12-26 MED ORDER — OZEMPIC (0.25 OR 0.5 MG/DOSE) 2 MG/3ML ~~LOC~~ SOPN
0.5000 mg | PEN_INJECTOR | SUBCUTANEOUS | 2 refills | Status: DC
  Filled 2021-12-26: qty 4.5, fill #0
  Filled 2021-12-26: qty 9, 84d supply, fill #0

## 2021-12-26 NOTE — Telephone Encounter (Signed)
Updated Rx sent

## 2022-01-02 ENCOUNTER — Other Ambulatory Visit: Payer: Self-pay | Admitting: Family Medicine

## 2022-01-02 DIAGNOSIS — J302 Other seasonal allergic rhinitis: Secondary | ICD-10-CM

## 2022-03-09 ENCOUNTER — Other Ambulatory Visit (HOSPITAL_BASED_OUTPATIENT_CLINIC_OR_DEPARTMENT_OTHER): Payer: Self-pay

## 2022-03-09 ENCOUNTER — Other Ambulatory Visit: Payer: Self-pay | Admitting: Family Medicine

## 2022-03-09 ENCOUNTER — Telehealth: Payer: Self-pay | Admitting: Family Medicine

## 2022-03-09 DIAGNOSIS — E1165 Type 2 diabetes mellitus with hyperglycemia: Secondary | ICD-10-CM

## 2022-03-09 MED ORDER — SEMAGLUTIDE (1 MG/DOSE) 4 MG/3ML ~~LOC~~ SOPN
1.0000 mg | PEN_INJECTOR | SUBCUTANEOUS | 3 refills | Status: DC
  Filled 2022-03-09: qty 3, 28d supply, fill #0

## 2022-03-09 NOTE — Telephone Encounter (Signed)
noted 

## 2022-03-09 NOTE — Telephone Encounter (Signed)
Pt stated she would like to increase ozempic. She is due for a refill this week.    [facility] [address] [place], [place] [address] Phone: [phone]  Fax: [phone]

## 2022-03-10 ENCOUNTER — Other Ambulatory Visit (HOSPITAL_BASED_OUTPATIENT_CLINIC_OR_DEPARTMENT_OTHER): Payer: Self-pay

## 2022-05-06 ENCOUNTER — Telehealth: Payer: Self-pay

## 2022-05-06 NOTE — Telephone Encounter (Signed)
PA initiated via Covermymeds; KEY: [id number]. PA approved.   Effective 04/06/2022 through 05/06/2023

## 2022-05-14 ENCOUNTER — Ambulatory Visit: Payer: Federal, State, Local not specified - PPO | Admitting: Family Medicine

## 2022-05-14 ENCOUNTER — Encounter: Payer: Self-pay | Admitting: Family Medicine

## 2022-05-14 VITALS — BP 133/90 | HR 68 | Temp 98.8°F | Resp 16 | Ht 65.0 in | Wt 228.0 lb

## 2022-05-14 DIAGNOSIS — J0140 Acute pansinusitis, unspecified: Secondary | ICD-10-CM

## 2022-05-14 MED ORDER — AMOXICILLIN-POT CLAVULANATE 400-57 MG/5ML PO SUSR: 10.0000 mL | Freq: Two times a day (BID) | ORAL | 0 refills | Status: AC

## 2022-05-14 MED ORDER — PROAIR RESPICLICK 108 (90 BASE) MCG/ACT IN AEPB: INHALATION_SPRAY | RESPIRATORY_TRACT | 1 refills | Status: AC

## 2022-05-14 NOTE — Patient Instructions (Signed)
Adding Augmentin and refilling your albuterol inhaler for as needed use.  Continue supportive measures including rest, hydration, humidifier use, steam showers, warm compresses to sinuses, warm liquids with lemon and honey, and over-the-counter cough, cold, and analgesics as needed.   Please contact office for follow-up if symptoms do not improve or worsen. Seek emergency care if symptoms become severe.

## 2022-05-14 NOTE — Progress Notes (Signed)
   Acute Office Visit  Subjective:     Patient ID: Ebony Curtis, female    DOB: Aug 08, 1960, 62 y.o.   MRN: [id number]  Chief Complaint  Patient presents with   Sinus Problem    Cough, sneezing  Started about 2 weeks    Patient reports feeling flu-like about 2 weeks ago. She has continued with cough with occasional yellow sputum, sinus pressure, nasal congestion, rhinorrhea, headaches. She denies any chest pain, dyspnea, fevers, GI/GU symptoms.      ROS All review of systems negative except what is listed in the HPI      Objective:    BP (!) 133/90   Pulse 68   Temp 98.8 F (37.1 C)   Resp 16   Ht '5\' 5"'$  (1.651 m)   Wt 228 lb (103.4 kg)   SpO2 98%   BMI 37.94 kg/m    Physical Exam Vitals reviewed.  Constitutional:      Appearance: Normal appearance.  HENT:     Head: Normocephalic and atraumatic.  Cardiovascular:     Rate and Rhythm: Normal rate and regular rhythm.     Pulses: Normal pulses.     Heart sounds: Normal heart sounds.  Pulmonary:     Effort: Pulmonary effort is normal.     Breath sounds: Normal breath sounds.  Musculoskeletal:     Cervical back: Normal range of motion and neck supple. No tenderness.  Skin:    General: Skin is warm and dry.  Neurological:     Mental Status: She is alert and oriented to person, place, and time.  Psychiatric:        Mood and Affect: Mood normal.        Behavior: Behavior normal.        Thought Content: Thought content normal.        Judgment: Judgment normal.     No results found for any visits on 05/14/22.      Assessment & Plan:   Problem List Items Addressed This Visit   None Visit Diagnoses     Acute non-recurrent pansinusitis    -  Primary Adding Augmentin and refilling your albuterol inhaler for as needed use.  Continue supportive measures including rest, hydration, humidifier use, steam showers, warm compresses to sinuses, warm liquids with lemon and honey, and over-the-counter cough,  cold, and analgesics as needed.     Relevant Medications   amoxicillin-clavulanate (AUGMENTIN) 400-57 MG/5ML suspension   Albuterol Sulfate (PROAIR RESPICLICK) 338 (90 Base) MCG/ACT AEPB       Meds ordered this encounter  Medications   amoxicillin-clavulanate (AUGMENTIN) 400-57 MG/5ML suspension    Sig: Take 10 mLs by mouth 2 (two) times daily for 10 days.    Dispense:  200 mL    Refill:  0    Order Specific Question:   Supervising Provider    Answer:   Penni Homans A [[id number]]   Albuterol Sulfate (PROAIR RESPICLICK) 250 (90 Base) MCG/ACT AEPB    Sig: 2 puffs qid prn    Dispense:  1 each    Refill:  1    Order Specific Question:   Supervising Provider    Answer:   Penni Homans A [[id number]]    Return if symptoms worsen or fail to improve.  Terrilyn Saver, NP

## 2022-05-15 ENCOUNTER — Other Ambulatory Visit: Payer: Self-pay | Admitting: Family Medicine

## 2022-05-15 DIAGNOSIS — I10 Essential (primary) hypertension: Secondary | ICD-10-CM

## 2022-06-30 ENCOUNTER — Other Ambulatory Visit: Payer: Self-pay | Admitting: Family Medicine

## 2022-06-30 DIAGNOSIS — E1165 Type 2 diabetes mellitus with hyperglycemia: Secondary | ICD-10-CM

## 2022-07-13 ENCOUNTER — Telehealth: Payer: Self-pay | Admitting: Family Medicine

## 2022-07-13 MED ORDER — ROSUVASTATIN CALCIUM 20 MG PO TABS: ORAL_TABLET | ORAL | 1 refills | Status: DC

## 2022-07-13 NOTE — Telephone Encounter (Signed)
Refill sent.

## 2022-07-13 NOTE — Telephone Encounter (Signed)
Medication: rosuvastatin (CRESTOR) 20 MG tablet  Has the patient contacted their pharmacy? Yes.     Preferred Pharmacy:   [id number], [place] - [address] [facility] [address], [place] [address] Phone: [phone]  Fax: [phone]

## 2022-10-19 ENCOUNTER — Other Ambulatory Visit: Payer: Self-pay | Admitting: Family Medicine

## 2022-10-19 DIAGNOSIS — E1165 Type 2 diabetes mellitus with hyperglycemia: Secondary | ICD-10-CM

## 2022-10-22 ENCOUNTER — Ambulatory Visit: Payer: Federal, State, Local not specified - PPO | Admitting: Family Medicine

## 2022-10-22 ENCOUNTER — Encounter: Payer: Self-pay | Admitting: Family Medicine

## 2022-10-22 VITALS — BP 138/88 | HR 74 | Temp 98.6°F | Resp 18 | Ht 65.0 in | Wt 221.0 lb

## 2022-10-22 DIAGNOSIS — E785 Hyperlipidemia, unspecified: Secondary | ICD-10-CM

## 2022-10-22 DIAGNOSIS — E1169 Type 2 diabetes mellitus with other specified complication: Secondary | ICD-10-CM

## 2022-10-22 DIAGNOSIS — E1165 Type 2 diabetes mellitus with hyperglycemia: Secondary | ICD-10-CM | POA: Diagnosis not present

## 2022-10-22 DIAGNOSIS — I10 Essential (primary) hypertension: Secondary | ICD-10-CM

## 2022-10-22 DIAGNOSIS — Z7985 Long-term (current) use of injectable non-insulin antidiabetic drugs: Secondary | ICD-10-CM

## 2022-10-22 MED ORDER — SEMAGLUTIDE (2 MG/DOSE) 8 MG/3ML ~~LOC~~ SOPN: 2.0000 mg | PEN_INJECTOR | SUBCUTANEOUS | 1 refills | Status: DC

## 2022-10-22 NOTE — Assessment & Plan Note (Signed)
Well controlled, no changes to meds. Encouraged heart healthy diet such as the DASH diet and exercise as tolerated.  °

## 2022-10-22 NOTE — Patient Instructions (Signed)

## 2022-10-22 NOTE — Progress Notes (Signed)
Established Patient Office Visit  Subjective   Patient ID: Ebony Curtis, female    DOB: [birth year]  Age: 62 y.o. MRN: [id number]  Chief Complaint  Patient presents with   Hypertension   Diabetes   Hyperlipidemia   Follow-up    HPI Discussed the use of AI scribe software for clinical note transcription with the patient, who gave verbal consent to proceed.  History of Present Illness   The patient presents for a medication refill, specifically Ozempic. She expresses hesitation about increasing the dosage due to concerns about their body becoming dependent on the higher dose. Despite this, she agrees to increase the dosage from one to two after learning she has lost seven pounds since February. She reports some exercise but does not elaborate on the frequency or intensity. She also mentions her husband is due for knee surgery but does not provide further details.      Patient Active Problem List   Diagnosis Date Noted   Thoracic spine pain 12/04/2021   Primary hypertension 01/02/2021   Acute bronchitis due to other specified organisms 06/20/2020   Bronchitis 06/20/2020   Back pain, chronic 09/09/2018   Seasonal allergies 08/27/2017   Uncontrolled type 2 diabetes mellitus with hyperglycemia (HCC) 08/27/2017   Chest pain 06/01/2016   Female incontinence 06/30/2010   BACK PAIN, LUMBAR, WITH RADICULOPATHY 04/23/2010   DYSURIA 04/23/2010   Depression with anxiety 11/12/2009   ONYCHOMYCOSIS, BILATERAL 08/05/2009   BACK PAIN, THORACIC REGION 07/22/2009   BENIGN POSITIONAL VERTIGO 05/24/2009   HEMATURIA, MICROSCOPIC, HX OF 05/24/2009   LUMBAR STRAIN, ACUTE 09/20/2008   ROTATOR CUFF INJURY, RIGHT SHOULDER 07/20/2008   Chronic right shoulder pain 07/20/2008   Essential hypertension 06/25/2008   UNSPECIFIED VITAMIN D DEFICIENCY 09/26/2007   Hyperlipidemia associated with type 2 diabetes mellitus (HCC) 07/28/2007   TOBACCO USE 07/28/2007   MIGRAINE 07/28/2007   GERD 07/28/2007    ELEVATED BLOOD PRESSURE 07/28/2007   Past Medical History:  Diagnosis Date   Chronic lower back pain    DVT (deep venous thrombosis) (HCC) "years ago"   RUE   Family history of adverse reaction to anesthesia    "sister's heart went out doing routine OR; she never woke up" (06/01/2016)   GERD (gastroesophageal reflux disease)    Heart murmur    "when I was young"   Hyperlipidemia    Migraines    "none in years" (06/01/2016)   Past Surgical History:  Procedure Laterality Date   TONSILLECTOMY  1970s   Social History   Tobacco Use   Smoking status: Every Day    Current packs/day: 1.00    Average packs/day: 1 pack/day for 35.0 years (35.0 ttl pk-yrs)    Types: Cigarettes   Smokeless tobacco: Never  Substance Use Topics   Alcohol use: Not Currently    Comment: 06/01/2016 "last drink was 10/2015"   Drug use: No   Social History   Socioeconomic History   Marital status: Married    Spouse name: Not on file   Number of children: Not on file   Years of education: Not on file   Highest education level: Not on file  Occupational History   Occupation: disable    Employer: [employer]    Comment: disability/ retired  Tobacco Use   Smoking status: Every Day    Current packs/day: 1.00    Average packs/day: 1 pack/day for 35.0 years (35.0 ttl pk-yrs)    Types: Cigarettes   Smokeless tobacco: Never  Substance  and Sexual Activity   Alcohol use: Not Currently    Comment: 06/01/2016 "last drink was 10/2015"   Drug use: No   Sexual activity: Yes    Partners: Male  Other Topics Concern   Not on file  Social History Narrative   Exercise--goes to the [employer]   Social Determinants of Health   Financial Resource Strain: Not on file  Food Insecurity: Not on file  Transportation Needs: Not on file  Physical Activity: Not on file  Stress: Not on file  Social Connections: Not on file  Intimate Partner Violence: Not on file   Family Status  Relation Name Status   Sister   Deceased   Sister  Deceased   Father  Deceased at age 27   Mother  Deceased   Other  (Not Specified)   MGM  Deceased   MGF  Deceased   PGM  Deceased   PGF  Deceased  No partnership data on file   Family History  Problem Relation Age of Onset   Diabetes Sister 39   Heart disease Sister    Heart disease Father 14   Arthritis Mother    Heart disease Mother        CABG, PACER   Coronary artery disease Other    No Known Allergies    Review of Systems  Constitutional:  Negative for fever and malaise/fatigue.  HENT:  Negative for congestion.   Eyes:  Negative for blurred vision.  Respiratory:  Negative for cough and shortness of breath.   Cardiovascular:  Negative for chest pain, palpitations and leg swelling.  Gastrointestinal:  Negative for abdominal pain, blood in stool, nausea and vomiting.  Genitourinary:  Negative for dysuria and frequency.  Musculoskeletal:  Negative for back pain and falls.  Skin:  Negative for rash.  Neurological:  Negative for dizziness, loss of consciousness and headaches.  Endo/Heme/Allergies:  Negative for environmental allergies.  Psychiatric/Behavioral:  Negative for depression. The patient is not nervous/anxious.       Objective:     BP 138/88 (BP Location: Left Arm, Patient Position: Sitting, Cuff Size: Large)   Pulse 74   Temp 98.6 F (37 C) (Oral)   Resp 18   Ht 5\' 5"  (1.651 m)   Wt 221 lb (100.2 kg)   SpO2 97%   BMI 36.78 kg/m  BP Readings from Last 3 Encounters:  10/22/22 138/88  05/14/22 (!) 133/90  12/04/21 (!) 132/92   Wt Readings from Last 3 Encounters:  10/22/22 221 lb (100.2 kg)  05/14/22 228 lb (103.4 kg)  12/04/21 230 lb 9.6 oz (104.6 kg)   SpO2 Readings from Last 3 Encounters:  10/22/22 97%  05/14/22 98%  12/04/21 96%      Physical Exam Vitals and nursing note reviewed.  Constitutional:      General: She is not in acute distress.    Appearance: Normal appearance. She is well-developed.  HENT:     Head:  Normocephalic and atraumatic.  Eyes:     General: No scleral icterus.       Right eye: No discharge.        Left eye: No discharge.  Cardiovascular:     Rate and Rhythm: Normal rate and regular rhythm.     Heart sounds: No murmur heard. Pulmonary:     Effort: Pulmonary effort is normal. No respiratory distress.     Breath sounds: Normal breath sounds.  Musculoskeletal:        General: Normal range of motion.  Cervical back: Normal range of motion and neck supple.     Right lower leg: No edema.     Left lower leg: No edema.  Skin:    General: Skin is warm and dry.  Neurological:     Mental Status: She is alert and oriented to person, place, and time.  Psychiatric:        Mood and Affect: Mood normal.        Behavior: Behavior normal.        Thought Content: Thought content normal.        Judgment: Judgment normal.      No results found for any visits on 10/22/22.  Last CBC Lab Results  Component Value Date   WBC 8.0 12/04/2021   HGB 13.7 12/04/2021   HCT 41.4 12/04/2021   MCV 92.8 12/04/2021   MCH 30.8 06/01/2016   RDW 15.0 12/04/2021   PLT 265.0 12/04/2021   Last metabolic panel Lab Results  Component Value Date   GLUCOSE 78 12/04/2021   NA 140 12/04/2021   K 4.6 12/04/2021   CL 104 12/04/2021   CO2 29 12/04/2021   BUN 9 12/04/2021   CREATININE 0.72 12/04/2021   GFR 90.07 12/04/2021   CALCIUM 9.4 12/04/2021   PROT 7.0 12/04/2021   ALBUMIN 4.0 12/04/2021   BILITOT 0.4 12/04/2021   ALKPHOS 55 12/04/2021   AST 12 12/04/2021   ALT 12 12/04/2021   ANIONGAP 8 06/02/2016   Last lipids Lab Results  Component Value Date   CHOL 155 12/04/2021   HDL 48.30 12/04/2021   LDLCALC 92 12/04/2021   LDLDIRECT 153.1 03/15/2012   TRIG 75.0 12/04/2021   CHOLHDL 3 12/04/2021   Last hemoglobin A1c Lab Results  Component Value Date   HGBA1C 6.6 (H) 12/04/2021   Last thyroid functions Lab Results  Component Value Date   TSH 1.28 03/07/2018   Last vitamin  D Lab Results  Component Value Date   VD25OH 45 08/26/2009   Last vitamin B12 and Folate No results found for: "VITAMINB12", "FOLATE"    The 10-year ASCVD risk score (Arnett DK, et al., 2019) is: 32.5%    Assessment & Plan:   Problem List Items Addressed This Visit       Unprioritized   Uncontrolled type 2 diabetes mellitus with hyperglycemia (HCC)   Relevant Medications   Semaglutide, 2 MG/DOSE, 8 MG/3ML SOPN   Other Relevant Orders   Comprehensive metabolic panel   Hemoglobin A1c   Microalbumin / creatinine urine ratio   Primary hypertension    Well controlled, no changes to meds. Encouraged heart healthy diet such as the DASH diet and exercise as tolerated.        Relevant Orders   Lipid panel   CBC with Differential/Platelet   Comprehensive metabolic panel   Hyperlipidemia associated with type 2 diabetes mellitus (HCC) - Primary    Tolerating statin, encouraged heart healthy diet, avoid trans fats, minimize simple carbs and saturated fats. Increase exercise as tolerated       Relevant Medications   Semaglutide, 2 MG/DOSE, 8 MG/3ML SOPN   Other Relevant Orders   Lipid panel   Comprehensive metabolic panel   Essential hypertension    Well controlled, no changes to meds. Encouraged heart healthy diet such as the DASH diet and exercise as tolerated.       Assessment and Plan    Diabetes Mellitus: Patient on Ozempic with some weight loss noted. Patient initially hesitant to increase dose due  to concerns about dependency, but agreed to increase to 2mg  after discussion. -Increase Ozempic to 2mg . -Refill Ozempic prescription at [facility]. -Order labs to assess current blood glucose control.  General Health Maintenance: -Encourage consistent exercise.        No follow-ups on file.    Donato Schultz, DO

## 2022-10-22 NOTE — Assessment & Plan Note (Signed)
Tolerating statin, encouraged heart healthy diet, avoid trans fats, minimize simple carbs and saturated fats. Increase exercise as tolerated 

## 2022-10-23 LAB — LIPID PANEL
Cholesterol: 137 mg/dL (ref 0–200)
HDL: 47.5 mg/dL (ref >39.00)
LDL Cholesterol: 73 mg/dL (ref 0–99)
NonHDL: 89.74
Total CHOL/HDL Ratio: 3
Triglycerides: 83 mg/dL (ref 0.0–149.0)
VLDL: 16.6 mg/dL (ref 0.0–40.0)

## 2022-10-23 LAB — CBC WITH DIFFERENTIAL/PLATELET
Basophils Absolute: 0.1 10*3/uL (ref 0.0–0.1)
Basophils Relative: 0.8 % (ref 0.0–3.0)
Eosinophils Absolute: 0.1 10*3/uL (ref 0.0–0.7)
Eosinophils Relative: 1.4 % (ref 0.0–5.0)
HCT: 43.4 % (ref 36.0–46.0)
Hemoglobin: 14.1 g/dL (ref 12.0–15.0)
Lymphocytes Relative: 55 % — ABNORMAL HIGH (ref 12.0–46.0)
Lymphs Abs: 4.5 10*3/uL — ABNORMAL HIGH (ref 0.7–4.0)
MCHC: 32.5 g/dL (ref 30.0–36.0)
MCV: 94.2 fl (ref 78.0–100.0)
Monocytes Absolute: 0.4 10*3/uL (ref 0.1–1.0)
Monocytes Relative: 5.3 % (ref 3.0–12.0)
Neutro Abs: 3.1 10*3/uL (ref 1.4–7.7)
Neutrophils Relative %: 37.5 % — ABNORMAL LOW (ref 43.0–77.0)
Platelets: 284 10*3/uL (ref 150.0–400.0)
RBC: 4.61 Mil/uL (ref 3.87–5.11)
RDW: 13.8 % (ref 11.5–15.5)
WBC: 8.2 10*3/uL (ref 4.0–10.5)

## 2022-10-23 LAB — MICROALBUMIN / CREATININE URINE RATIO
Creatinine,U: 215.4 mg/dL
Microalb Creat Ratio: 0.6 mg/g (ref 0.0–30.0)
Microalb, Ur: 1.2 mg/dL (ref 0.0–1.9)

## 2022-10-23 LAB — COMPREHENSIVE METABOLIC PANEL
ALT: 9 U/L (ref 0–35)
AST: 11 U/L (ref 0–37)
Albumin: 4.1 g/dL (ref 3.5–5.2)
Alkaline Phosphatase: 54 U/L (ref 39–117)
BUN: 9 mg/dL (ref 6–23)
CO2: 29 mEq/L (ref 19–32)
Calcium: 9.8 mg/dL (ref 8.4–10.5)
Chloride: 104 mEq/L (ref 96–112)
Creatinine, Ser: 0.86 mg/dL (ref 0.40–1.20)
GFR: 72.33 mL/min (ref >60.00)
Glucose, Bld: 81 mg/dL (ref 70–99)
Potassium: 4.6 mEq/L (ref 3.5–5.1)
Sodium: 141 mEq/L (ref 135–145)
Total Bilirubin: 0.3 mg/dL (ref 0.2–1.2)
Total Protein: 6.8 g/dL (ref 6.0–8.3)

## 2022-10-23 LAB — HEMOGLOBIN A1C: Hgb A1c MFr Bld: 6.3 % (ref 4.6–6.5)

## 2022-12-29 ENCOUNTER — Other Ambulatory Visit: Payer: Self-pay | Admitting: Family Medicine

## 2022-12-29 DIAGNOSIS — I10 Essential (primary) hypertension: Secondary | ICD-10-CM

## 2023-02-11 ENCOUNTER — Other Ambulatory Visit: Payer: Self-pay | Admitting: Family Medicine

## 2023-02-11 DIAGNOSIS — J302 Other seasonal allergic rhinitis: Secondary | ICD-10-CM

## 2023-04-06 ENCOUNTER — Other Ambulatory Visit: Payer: Self-pay | Admitting: Family Medicine

## 2023-04-06 DIAGNOSIS — E1165 Type 2 diabetes mellitus with hyperglycemia: Secondary | ICD-10-CM

## 2023-05-04 ENCOUNTER — Ambulatory Visit: Payer: Federal, State, Local not specified - PPO | Admitting: Family Medicine

## 2023-05-06 ENCOUNTER — Telehealth: Payer: Self-pay

## 2023-05-06 ENCOUNTER — Other Ambulatory Visit: Payer: Self-pay | Admitting: Family Medicine

## 2023-05-06 DIAGNOSIS — E1165 Type 2 diabetes mellitus with hyperglycemia: Secondary | ICD-10-CM

## 2023-05-06 NOTE — Telephone Encounter (Signed)
PA initiated via Covermymeds; KEY: [id number]. Awaiting determination.

## 2023-05-06 NOTE — Telephone Encounter (Signed)
PA approved. Effective 04/07/2023 - 05/05/2024

## 2023-05-06 NOTE — Telephone Encounter (Signed)
Copied from CRM [id number]. Topic: Clinical - Medication Refill >> May 06, 2023 12:26 PM Fuller Mandril wrote: Most Recent Primary Care Visit:  Provider: Seabron Spates R  Department: [facility]  Visit Type: OFFICE VISIT  Date: 10/22/2022  Medication: Semaglutide, 2 MG/DOSE, (OZEMPIC, 2 MG/DOSE,) 8 MG/3ML  Has the patient contacted their pharmacy? Yes (Agent: If no, request that the patient contact the pharmacy for the refill. If patient does not wish to contact the pharmacy document the reason why and proceed with request.) (Agent: If yes, when and what did the pharmacy advise?) Still pending   Is this the correct pharmacy for this prescription? Yes If no, delete pharmacy and type the correct one.  This is the patient's preferred pharmacy:  [facility] [address] [place], [place] - [address] [place]. AT [facility] [address] [place]. [place] [address] Phone: [phone] Fax: [phone]   Has the prescription been filled recently? 04/06/2023  Is the patient out of the medication? Yes  Has the patient been seen for an appointment in the last year OR does the patient have an upcoming appointment? Yes 10/18/2022 -05/10/2023  Can we respond through MyChart? Yes  Agent: Please be advised that Rx refills may take up to 3 business days. We ask that you follow-up with your pharmacy.

## 2023-05-10 ENCOUNTER — Encounter: Payer: Self-pay | Admitting: Family Medicine

## 2023-05-10 ENCOUNTER — Ambulatory Visit: Payer: Federal, State, Local not specified - PPO | Admitting: Family Medicine

## 2023-05-10 VITALS — BP 138/90 | HR 74 | Temp 98.6°F | Ht 65.0 in | Wt 212.0 lb

## 2023-05-10 DIAGNOSIS — I10 Essential (primary) hypertension: Secondary | ICD-10-CM

## 2023-05-10 DIAGNOSIS — Z1211 Encounter for screening for malignant neoplasm of colon: Secondary | ICD-10-CM

## 2023-05-10 DIAGNOSIS — E785 Hyperlipidemia, unspecified: Secondary | ICD-10-CM

## 2023-05-10 DIAGNOSIS — E1169 Type 2 diabetes mellitus with other specified complication: Secondary | ICD-10-CM

## 2023-05-10 DIAGNOSIS — J0140 Acute pansinusitis, unspecified: Secondary | ICD-10-CM

## 2023-05-10 DIAGNOSIS — E1165 Type 2 diabetes mellitus with hyperglycemia: Secondary | ICD-10-CM

## 2023-05-10 DIAGNOSIS — Z7985 Long-term (current) use of injectable non-insulin antidiabetic drugs: Secondary | ICD-10-CM

## 2023-05-10 LAB — LIPID PANEL
Cholesterol: 143 mg/dL (ref 0–200)
HDL: 49.2 mg/dL (ref >39.00)
LDL Cholesterol: 81 mg/dL (ref 0–99)
NonHDL: 94.18
Total CHOL/HDL Ratio: 3
Triglycerides: 68 mg/dL (ref 0.0–149.0)
VLDL: 13.6 mg/dL (ref 0.0–40.0)

## 2023-05-10 LAB — COMPREHENSIVE METABOLIC PANEL
ALT: 8 U/L (ref 0–35)
AST: 11 U/L (ref 0–37)
Albumin: 4.2 g/dL (ref 3.5–5.2)
Alkaline Phosphatase: 49 U/L (ref 39–117)
BUN: 12 mg/dL (ref 6–23)
CO2: 29 meq/L (ref 19–32)
Calcium: 9.1 mg/dL (ref 8.4–10.5)
Chloride: 104 meq/L (ref 96–112)
Creatinine, Ser: 0.8 mg/dL (ref 0.40–1.20)
GFR: 78.58 mL/min (ref >60.00)
Glucose, Bld: 83 mg/dL (ref 70–99)
Potassium: 4.7 meq/L (ref 3.5–5.1)
Sodium: 140 meq/L (ref 135–145)
Total Bilirubin: 0.4 mg/dL (ref 0.2–1.2)
Total Protein: 7.1 g/dL (ref 6.0–8.3)

## 2023-05-10 LAB — CBC WITH DIFFERENTIAL/PLATELET
Basophils Absolute: 0 10*3/uL (ref 0.0–0.1)
Basophils Relative: 0.6 % (ref 0.0–3.0)
Eosinophils Absolute: 0.1 10*3/uL (ref 0.0–0.7)
Eosinophils Relative: 1 % (ref 0.0–5.0)
HCT: 45.2 % (ref 36.0–46.0)
Hemoglobin: 14.7 g/dL (ref 12.0–15.0)
Lymphocytes Relative: 52.7 % — ABNORMAL HIGH (ref 12.0–46.0)
Lymphs Abs: 3.4 10*3/uL (ref 0.7–4.0)
MCHC: 32.6 g/dL (ref 30.0–36.0)
MCV: 94.1 fL (ref 78.0–100.0)
Monocytes Absolute: 0.3 10*3/uL (ref 0.1–1.0)
Monocytes Relative: 4.9 % (ref 3.0–12.0)
Neutro Abs: 2.6 10*3/uL (ref 1.4–7.7)
Neutrophils Relative %: 40.8 % — ABNORMAL LOW (ref 43.0–77.0)
Platelets: 259 10*3/uL (ref 150.0–400.0)
RBC: 4.8 Mil/uL (ref 3.87–5.11)
RDW: 14 % (ref 11.5–15.5)
WBC: 6.4 10*3/uL (ref 4.0–10.5)

## 2023-05-10 LAB — HEMOGLOBIN A1C: Hgb A1c MFr Bld: 6.2 % (ref 4.6–6.5)

## 2023-05-10 MED ORDER — OZEMPIC (2 MG/DOSE) 8 MG/3ML ~~LOC~~ SOPN: 2.0000 mg | PEN_INJECTOR | SUBCUTANEOUS | 1 refills | Status: DC

## 2023-05-10 NOTE — Assessment & Plan Note (Signed)
 hgba1c to be checked, minimize simple carbs. Increase exercise as tolerated. Continue current meds

## 2023-05-10 NOTE — Assessment & Plan Note (Signed)
 Well controlled, no changes to meds. Encouraged heart healthy diet such as the DASH diet and exercise as tolerated.

## 2023-05-10 NOTE — Progress Notes (Signed)
Established Patient Office Visit  Subjective   Patient ID: Ebony Curtis, female    DOB: 1960-09-13  Age: 63 y.o. MRN: [id number]  Chief Complaint  Patient presents with   Cough    Cough onset 2 weeks has since subsided  Pt believes she may of had the Flu     HPI Discussed the use of AI scribe software for clinical note transcription with the patient, who gave verbal consent to proceed.  History of Present Illness   The patient presents with a request for medication refills and discussion of recent health concerns.  Approximately two weeks ago, she experienced flu-like symptoms with alternating hot and cold sensations, though she did not confirm a fever. These symptoms have improved but persist to a lesser degree.  She inquires about refilling her albuterol prescription for asthma management, noting she still has some left and it is not expired.  She is currently taking a medication at a dose of 2 mg, receiving a month's supply of four pens. She is concerned about the frequency of needing to check for refills and questions the possibility of discontinuing the medication if she loses weight. She has not lost much weight yet but plans to start using a treadmill she recently brought to her current residence.  Her blood pressure was elevated today, which she attributes to possibly missing her medication or consuming salty food prepared by her daughter. She usually takes her blood pressure medication in the evening and does not regularly check her blood pressure at home. No current leg swelling, and she can usually tell when her blood pressure is elevated by the presence of swelling.  She is staying with family in [place], caring for relatives, one of whom has dementia and is in home hospice for cancer. She describes the situation as stressful, noting the challenges of managing her relative's dementia and wound care needs.  She has not had a mammogram since 2018 and has not received  flu, pneumonia, or shingles vaccinations. She has not undergone lung cancer screening or a colonoscopy, though she expresses interest in the Cologuard test.      Patient Active Problem List   Diagnosis Date Noted   Thoracic spine pain 12/04/2021   Primary hypertension 01/02/2021   Acute bronchitis due to other specified organisms 06/20/2020   Bronchitis 06/20/2020   Back pain, chronic 09/09/2018   Seasonal allergies 08/27/2017   Uncontrolled type 2 diabetes mellitus with hyperglycemia (HCC) 08/27/2017   Chest pain 06/01/2016   Female incontinence 06/30/2010   BACK PAIN, LUMBAR, WITH RADICULOPATHY 04/23/2010   DYSURIA 04/23/2010   Depression with anxiety 11/12/2009   ONYCHOMYCOSIS, BILATERAL 08/05/2009   BACK PAIN, THORACIC REGION 07/22/2009   BENIGN POSITIONAL VERTIGO 05/24/2009   HEMATURIA, MICROSCOPIC, HX OF 05/24/2009   LUMBAR STRAIN, ACUTE 09/20/2008   Disorder of bursae and tendons in shoulder region 07/20/2008   Chronic right shoulder pain 07/20/2008   Essential hypertension 06/25/2008   Vitamin D deficiency 09/26/2007   Hyperlipidemia associated with type 2 diabetes mellitus (HCC) 07/28/2007   TOBACCO USE 07/28/2007   Migraine headache 07/28/2007   GERD 07/28/2007   ELEVATED BLOOD PRESSURE 07/28/2007   Past Medical History:  Diagnosis Date   Chronic lower back pain    DVT (deep venous thrombosis) (HCC) "years ago"   RUE   Family history of adverse reaction to anesthesia    "sister's heart went out doing routine OR; she never woke up" (06/01/2016)   GERD (gastroesophageal reflux disease)  Heart murmur    "when I was young"   Hyperlipidemia    Migraines    "none in years" (06/01/2016)   Past Surgical History:  Procedure Laterality Date   TONSILLECTOMY  1970s   Social History   Tobacco Use   Smoking status: Every Day    Current packs/day: 1.00    Average packs/day: 1 pack/day for 35.0 years (35.0 ttl pk-yrs)    Types: Cigarettes   Smokeless tobacco: Never   Substance Use Topics   Alcohol use: Not Currently    Comment: 06/01/2016 "last drink was 10/2015"   Drug use: No   Social History   Socioeconomic History   Marital status: Married    Spouse name: Not on file   Number of children: Not on file   Years of education: Not on file   Highest education level: Not on file  Occupational History   Occupation: disable    Employer: [employer]    Comment: disability/ retired  Tobacco Use   Smoking status: Every Day    Current packs/day: 1.00    Average packs/day: 1 pack/day for 35.0 years (35.0 ttl pk-yrs)    Types: Cigarettes   Smokeless tobacco: Never  Substance and Sexual Activity   Alcohol use: Not Currently    Comment: 06/01/2016 "last drink was 10/2015"   Drug use: No   Sexual activity: Yes    Partners: Male  Other Topics Concern   Not on file  Social History Narrative   Exercise--goes to the [employer]   Social Drivers of Health   Financial Resource Strain: Not on file  Food Insecurity: Not on file  Transportation Needs: Not on file  Physical Activity: Not on file  Stress: Not on file  Social Connections: Not on file  Intimate Partner Violence: Not on file   Family Status  Relation Name Status   Sister  Deceased   Sister  Deceased   Father  Deceased at age 46   Mother  Deceased   Other  (Not Specified)   MGM  Deceased   MGF  Deceased   PGM  Deceased   PGF  Deceased  No partnership data on file   Family History  Problem Relation Age of Onset   Diabetes Sister 64   Heart disease Sister    Heart disease Father 2   Arthritis Mother    Heart disease Mother        CABG, PACER   Coronary artery disease Other    No Known Allergies    Review of Systems  Constitutional:  Negative for fever and malaise/fatigue.  HENT:  Negative for congestion.   Eyes:  Negative for blurred vision.  Respiratory:  Negative for cough and shortness of breath.   Cardiovascular:  Negative for chest pain, palpitations and leg swelling.   Gastrointestinal:  Negative for abdominal pain, blood in stool, nausea and vomiting.  Genitourinary:  Negative for dysuria and frequency.  Musculoskeletal:  Negative for back pain and falls.  Skin:  Negative for rash.  Neurological:  Negative for dizziness, loss of consciousness and headaches.  Endo/Heme/Allergies:  Negative for environmental allergies.  Psychiatric/Behavioral:  Negative for depression. The patient is not nervous/anxious.       Objective:     BP (!) 138/90 (BP Location: Left Arm, Patient Position: Sitting, Cuff Size: Large)   Pulse 74   Temp 98.6 F (37 C) (Oral)   Ht 5\' 5"  (1.651 m)   Wt 212 lb (96.2 kg)  SpO2 96%   BMI 35.28 kg/m  BP Readings from Last 3 Encounters:  05/10/23 (!) 138/90  10/22/22 138/88  05/14/22 (!) 133/90   Wt Readings from Last 3 Encounters:  05/10/23 212 lb (96.2 kg)  10/22/22 221 lb (100.2 kg)  05/14/22 228 lb (103.4 kg)   SpO2 Readings from Last 3 Encounters:  05/10/23 96%  10/22/22 97%  05/14/22 98%      Physical Exam Vitals and nursing note reviewed.  Constitutional:      General: She is not in acute distress.    Appearance: Normal appearance. She is well-developed.  HENT:     Head: Normocephalic and atraumatic.  Eyes:     General: No scleral icterus.       Right eye: No discharge.        Left eye: No discharge.  Cardiovascular:     Rate and Rhythm: Normal rate and regular rhythm.     Heart sounds: No murmur heard. Pulmonary:     Effort: Pulmonary effort is normal. No respiratory distress.     Breath sounds: Normal breath sounds.  Musculoskeletal:        General: Normal range of motion.     Cervical back: Normal range of motion and neck supple.     Right lower leg: No edema.     Left lower leg: No edema.  Skin:    General: Skin is warm and dry.  Neurological:     Mental Status: She is alert and oriented to person, place, and time.  Psychiatric:        Mood and Affect: Mood normal.        Behavior:  Behavior normal.        Thought Content: Thought content normal.        Judgment: Judgment normal.      No results found for any visits on 05/10/23.  Last CBC Lab Results  Component Value Date   WBC 8.2 10/22/2022   HGB 14.1 10/22/2022   HCT 43.4 10/22/2022   MCV 94.2 10/22/2022   MCH 30.8 06/01/2016   RDW 13.8 10/22/2022   PLT 284.0 10/22/2022   Last metabolic panel Lab Results  Component Value Date   GLUCOSE 81 10/22/2022   NA 141 10/22/2022   K 4.6 10/22/2022   CL 104 10/22/2022   CO2 29 10/22/2022   BUN 9 10/22/2022   CREATININE 0.86 10/22/2022   GFR 72.33 10/22/2022   CALCIUM 9.8 10/22/2022   PROT 6.8 10/22/2022   ALBUMIN 4.1 10/22/2022   BILITOT 0.3 10/22/2022   ALKPHOS 54 10/22/2022   AST 11 10/22/2022   ALT 9 10/22/2022   ANIONGAP 8 06/02/2016   Last lipids Lab Results  Component Value Date   CHOL 137 10/22/2022   HDL 47.50 10/22/2022   LDLCALC 73 10/22/2022   LDLDIRECT 153.1 03/15/2012   TRIG 83.0 10/22/2022   CHOLHDL 3 10/22/2022   Last hemoglobin A1c Lab Results  Component Value Date   HGBA1C 6.3 10/22/2022   Last thyroid functions Lab Results  Component Value Date   TSH 1.28 03/07/2018   Last vitamin D Lab Results  Component Value Date   VD25OH 45 08/26/2009   Last vitamin B12 and Folate No results found for: "VITAMINB12", "FOLATE"    The 10-year ASCVD risk score (Arnett DK, et al., 2019) is: 30.8%    Assessment & Plan:   Problem List Items Addressed This Visit       Unprioritized   Uncontrolled type 2  diabetes mellitus with hyperglycemia (HCC)   Relevant Medications   Semaglutide, 2 MG/DOSE, (OZEMPIC, 2 MG/DOSE,) 8 MG/3ML SOPN   Other Relevant Orders   Hemoglobin A1c   Primary hypertension   Relevant Orders   Lipid panel   CBC with Differential/Platelet   Comprehensive metabolic panel   Hyperlipidemia associated with type 2 diabetes mellitus (HCC) - Primary   hgba1c to be checked , minimize simple carbs. Increase  exercise as tolerated. Continue current meds       Relevant Medications   Semaglutide, 2 MG/DOSE, (OZEMPIC, 2 MG/DOSE,) 8 MG/3ML SOPN   Other Relevant Orders   Lipid panel   Comprehensive metabolic panel   Hemoglobin A1c   Essential hypertension   Well controlled, no changes to meds. Encouraged heart healthy diet such as the DASH diet and exercise as tolerated.        Other Visit Diagnoses       Acute non-recurrent pansinusitis         Colon cancer screening       Relevant Orders   Cologuard     Assessment and Plan    Recent Viral Illness A viral illness two weeks ago caused hot and cold sensations without a documented fever. Symptoms have improved but persist mildly. The spouse's recent emergency room visit may be related.  Hypertension Blood pressure is elevated today, likely due to missing the morning dose of medication, which is usually taken in the evening. There is no regular home monitoring of blood pressure. Emphasized the importance of monitoring to prevent complications like myocardial infarction or stroke. Elevated levels may be linked to recent dietary intake and stress. Plan to check blood pressure at home and report readings. Consider increasing Losartan if blood pressure remains elevated.  Diabetes Mellitus Currently on Ozempic for diabetes management. There is interest in discontinuing the medication with weight loss. Discussed the possibility of weaning off with significant weight loss and a proper diet to prevent weight regain. Weight loss has been minimal, and there is stress from caring for family members with serious health conditions. Plan to send an Ozempic prescription for a three-month supply.  General Health Maintenance Due for a mammogram, last done in 2018. Has not received flu, pneumonia, or shingles vaccinations. Interested in Cologuard for colon cancer screening. Discussed the importance of these screenings and vaccinations for preventive health. Plan  to order a mammogram and discuss flu, pneumonia, and shingles vaccinations, lung cancer screening, and Cologuard for colon cancer screening.  Follow-up Schedule a follow-up appointment in six months.        No follow-ups on file.    Donato Schultz, DO

## 2023-05-28 ENCOUNTER — Other Ambulatory Visit: Payer: Self-pay | Admitting: Family Medicine

## 2023-05-28 DIAGNOSIS — E1165 Type 2 diabetes mellitus with hyperglycemia: Secondary | ICD-10-CM

## 2023-05-31 ENCOUNTER — Other Ambulatory Visit: Payer: Self-pay | Admitting: Family Medicine

## 2023-05-31 DIAGNOSIS — E1165 Type 2 diabetes mellitus with hyperglycemia: Secondary | ICD-10-CM

## 2023-06-01 ENCOUNTER — Telehealth: Payer: Self-pay | Admitting: Neurology

## 2023-06-01 DIAGNOSIS — E1165 Type 2 diabetes mellitus with hyperglycemia: Secondary | ICD-10-CM

## 2023-06-01 MED ORDER — OZEMPIC (2 MG/DOSE) 8 MG/3ML ~~LOC~~ SOPN: 2.0000 mg | PEN_INJECTOR | SUBCUTANEOUS | 1 refills | Status: DC

## 2023-06-01 NOTE — Telephone Encounter (Signed)
 Looks like this was sent 2 weeks ago to [facility]. Sent to [facility] per patient request.     Copied from CRM [id number]. Topic: Clinical - Medication Question >> Jun 01, 2023 10:04 AM Ebony Curtis wrote: Reason for CRM: Patient is calling to follow up on her prescription for Semaglutide, 2 MG/DOSE, (OZEMPIC, 2 MG/DOSE,) 8 MG/3ML SOPN, she's contacted the pharmacy and they advised that they have not gotten it yet. She would like to know if we can call and provide a verbal order. [facility] [address] [place], [place] - [address] AT [facility] [address] [address] Phone: [phone] Fax: [phone]

## 2023-06-03 LAB — COLOGUARD: COLOGUARD: NEGATIVE

## 2023-08-18 ENCOUNTER — Encounter: Payer: Self-pay | Admitting: Family Medicine

## 2023-08-18 DIAGNOSIS — E1165 Type 2 diabetes mellitus with hyperglycemia: Secondary | ICD-10-CM

## 2023-08-20 MED ORDER — OZEMPIC (2 MG/DOSE) 8 MG/3ML ~~LOC~~ SOPN: 2.0000 mg | PEN_INJECTOR | SUBCUTANEOUS | 1 refills | Status: DC

## 2023-11-10 ENCOUNTER — Other Ambulatory Visit: Payer: Self-pay | Admitting: Family Medicine

## 2023-11-10 DIAGNOSIS — I10 Essential (primary) hypertension: Secondary | ICD-10-CM

## 2023-11-12 ENCOUNTER — Other Ambulatory Visit: Payer: Self-pay | Admitting: Family Medicine

## 2023-11-12 DIAGNOSIS — E1165 Type 2 diabetes mellitus with hyperglycemia: Secondary | ICD-10-CM

## 2024-02-04 ENCOUNTER — Other Ambulatory Visit: Payer: Self-pay | Admitting: Family Medicine

## 2024-02-04 DIAGNOSIS — E1165 Type 2 diabetes mellitus with hyperglycemia: Secondary | ICD-10-CM

## 2024-05-02 ENCOUNTER — Other Ambulatory Visit: Payer: Self-pay | Admitting: Family

## 2024-05-02 DIAGNOSIS — E1165 Type 2 diabetes mellitus with hyperglycemia: Secondary | ICD-10-CM
